# Patient Record
Sex: Female | Born: 1990 | Race: Black or African American | Hispanic: No | Marital: Single | Smoking: Current some day smoker
Health system: Southern US, Community
[De-identification: ages and names within clinical notes are randomized; demographics above are authoritative.]

## PROBLEM LIST (undated history)

## (undated) ENCOUNTER — Ambulatory Visit (HOSPITAL_COMMUNITY): Admission: EM | Payer: No Typology Code available for payment source | Source: Home / Self Care

## (undated) DIAGNOSIS — N760 Acute vaginitis: Secondary | ICD-10-CM

## (undated) DIAGNOSIS — I10 Essential (primary) hypertension: Secondary | ICD-10-CM

## (undated) DIAGNOSIS — M419 Scoliosis, unspecified: Secondary | ICD-10-CM

## (undated) DIAGNOSIS — L732 Hidradenitis suppurativa: Secondary | ICD-10-CM

## (undated) DIAGNOSIS — G44009 Cluster headache syndrome, unspecified, not intractable: Secondary | ICD-10-CM

## (undated) DIAGNOSIS — L0291 Cutaneous abscess, unspecified: Secondary | ICD-10-CM

## (undated) DIAGNOSIS — B9689 Other specified bacterial agents as the cause of diseases classified elsewhere: Secondary | ICD-10-CM

## (undated) HISTORY — PX: WISDOM TOOTH EXTRACTION: SHX21

---
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
## 2011-01-02 LAB — URINALYSIS, ROUTINE W REFLEX MICROSCOPIC
Bilirubin Urine: NEGATIVE
Glucose, UA: NEGATIVE mg/dL
Hgb urine dipstick: NEGATIVE
Ketones, ur: NEGATIVE mg/dL
Nitrite: NEGATIVE
Protein, ur: NEGATIVE mg/dL
Specific Gravity, Urine: 1.024 (ref 1.005–1.030)
Urobilinogen, UA: 0.2 mg/dL (ref 0.0–1.0)

## 2011-01-03 LAB — DIFFERENTIAL
Basophils Absolute: 0 10*3/uL (ref 0.0–0.1)
Eosinophils Absolute: 0.1 10*3/uL (ref 0.0–1.2)
Lymphs Abs: 3.2 10*3/uL (ref 1.1–4.8)
Monocytes Relative: 6 % (ref 3–11)
Neutrophils Relative %: 62 % (ref 43–71)

## 2011-01-03 LAB — COMPREHENSIVE METABOLIC PANEL
Albumin: 4.2 g/dL (ref 3.5–5.2)
BUN: 7 mg/dL (ref 6–23)
CO2: 27 mEq/L (ref 19–32)
Chloride: 99 mEq/L (ref 96–112)
Glucose, Bld: 88 mg/dL (ref 70–99)
Potassium: 3.8 mEq/L (ref 3.5–5.1)
Sodium: 136 mEq/L (ref 135–145)
Total Bilirubin: 0.8 mg/dL (ref 0.3–1.2)

## 2011-01-03 LAB — POCT URINALYSIS DIP (DEVICE)
Bilirubin Urine: NEGATIVE
Hgb urine dipstick: NEGATIVE
Ketones, ur: NEGATIVE mg/dL
Nitrite: NEGATIVE
Protein, ur: NEGATIVE mg/dL
Urobilinogen, UA: 0.2 mg/dL (ref 0.0–1.0)

## 2011-01-03 LAB — POCT I-STAT, CHEM 8
Chloride: 101 mEq/L (ref 96–112)
Creatinine, Ser: 0.8 mg/dL (ref 0.4–1.2)
Glucose, Bld: 87 mg/dL (ref 70–99)
Hemoglobin: 13.3 g/dL (ref 12.0–16.0)
Potassium: 4.4 mEq/L (ref 3.5–5.1)

## 2011-01-03 LAB — CBC
Hemoglobin: 12 g/dL (ref 12.0–16.0)
RBC: 4.82 MIL/uL (ref 3.80–5.70)
RDW: 15.6 % — ABNORMAL HIGH (ref 11.4–15.5)
WBC: 10.3 10*3/uL (ref 4.5–13.5)

## 2011-01-03 LAB — POCT PREGNANCY, URINE: Preg Test, Ur: NEGATIVE

## 2011-01-14 LAB — CULTURE, ROUTINE-ABSCESS

## 2011-03-31 ENCOUNTER — Emergency Department (HOSPITAL_COMMUNITY)
Admission: EM | Admit: 2011-03-31 | Discharge: 2011-03-31 | Disposition: A | Discharge status: Home / Self Care | Payer: Medicaid Other | Attending: Emergency Medicine | Admitting: Emergency Medicine

## 2011-03-31 DIAGNOSIS — L02219 Cutaneous abscess of trunk, unspecified: Secondary | ICD-10-CM | POA: Insufficient documentation

## 2011-03-31 DIAGNOSIS — H9190 Unspecified hearing loss, unspecified ear: Secondary | ICD-10-CM | POA: Insufficient documentation

## 2011-03-31 DIAGNOSIS — H9209 Otalgia, unspecified ear: Secondary | ICD-10-CM | POA: Insufficient documentation

## 2011-07-04 LAB — URINE CULTURE: Colony Count: 70000

## 2011-07-04 LAB — URINALYSIS, ROUTINE W REFLEX MICROSCOPIC
Bilirubin Urine: NEGATIVE
Specific Gravity, Urine: 1.013 (ref 1.005–1.030)

## 2011-07-04 LAB — URINE MICROSCOPIC-ADD ON

## 2011-07-06 ENCOUNTER — Emergency Department (HOSPITAL_COMMUNITY)
Admission: EM | Admit: 2011-07-06 | Discharge: 2011-07-06 | Disposition: A | Discharge status: Home / Self Care | Payer: Medicaid Other | Source: Home / Self Care | Attending: Emergency Medicine | Admitting: Emergency Medicine

## 2011-07-06 ENCOUNTER — Emergency Department (HOSPITAL_COMMUNITY)
Admission: EM | Admit: 2011-07-06 | Discharge: 2011-07-07 | Disposition: A | Discharge status: Home / Self Care | Payer: Medicaid Other | Attending: Emergency Medicine | Admitting: Emergency Medicine

## 2011-07-06 DIAGNOSIS — B86 Scabies: Secondary | ICD-10-CM | POA: Insufficient documentation

## 2011-07-06 DIAGNOSIS — R21 Rash and other nonspecific skin eruption: Secondary | ICD-10-CM | POA: Insufficient documentation

## 2011-07-06 DIAGNOSIS — L298 Other pruritus: Secondary | ICD-10-CM | POA: Insufficient documentation

## 2011-07-06 DIAGNOSIS — T148 Other injury of unspecified body region: Secondary | ICD-10-CM | POA: Insufficient documentation

## 2011-07-06 DIAGNOSIS — R0789 Other chest pain: Secondary | ICD-10-CM | POA: Insufficient documentation

## 2011-07-06 DIAGNOSIS — W57XXXA Bitten or stung by nonvenomous insect and other nonvenomous arthropods, initial encounter: Secondary | ICD-10-CM | POA: Insufficient documentation

## 2011-07-06 DIAGNOSIS — L2989 Other pruritus: Secondary | ICD-10-CM | POA: Insufficient documentation

## 2011-07-09 LAB — CULTURE, ROUTINE-ABSCESS

## 2011-08-11 ENCOUNTER — Emergency Department (HOSPITAL_COMMUNITY)
Admission: EM | Admit: 2011-08-11 | Discharge: 2011-08-11 | Disposition: A | Discharge status: Home / Self Care | Payer: Medicaid Other | Attending: Emergency Medicine | Admitting: Emergency Medicine

## 2011-08-11 DIAGNOSIS — J3489 Other specified disorders of nose and nasal sinuses: Secondary | ICD-10-CM | POA: Insufficient documentation

## 2011-08-11 DIAGNOSIS — R059 Cough, unspecified: Secondary | ICD-10-CM | POA: Insufficient documentation

## 2011-08-11 DIAGNOSIS — R6883 Chills (without fever): Secondary | ICD-10-CM | POA: Insufficient documentation

## 2011-08-11 DIAGNOSIS — R05 Cough: Secondary | ICD-10-CM | POA: Insufficient documentation

## 2011-08-11 DIAGNOSIS — IMO0001 Reserved for inherently not codable concepts without codable children: Secondary | ICD-10-CM | POA: Insufficient documentation

## 2011-08-11 DIAGNOSIS — H9209 Otalgia, unspecified ear: Secondary | ICD-10-CM | POA: Insufficient documentation

## 2011-08-11 DIAGNOSIS — R51 Headache: Secondary | ICD-10-CM | POA: Insufficient documentation

## 2011-08-11 DIAGNOSIS — R070 Pain in throat: Secondary | ICD-10-CM | POA: Insufficient documentation

## 2011-08-11 DIAGNOSIS — R61 Generalized hyperhidrosis: Secondary | ICD-10-CM | POA: Insufficient documentation

## 2011-08-11 DIAGNOSIS — J329 Chronic sinusitis, unspecified: Secondary | ICD-10-CM | POA: Insufficient documentation

## 2011-08-11 HISTORY — DX: Scoliosis, unspecified: M41.9

## 2011-08-11 MED ORDER — PSEUDOEPHEDRINE HCL 30 MG PO TABS: 30.0000 mg | ORAL_TABLET | ORAL | Status: AC | PRN

## 2011-08-11 MED ORDER — MOXIFLOXACIN HCL 400 MG PO TABS: 400.0000 mg | ORAL_TABLET | Freq: Every day | ORAL | Status: AC

## 2011-08-11 NOTE — ED Provider Notes (Signed)
History     CSN: [id number] Arrival date & time: 08/11/2011 10:25 AM   First MD Initiated Contact with Patient 08/11/11 1048      Chief Complaint  Patient presents with  . Cough  . Nasal Congestion  . Generalized Body Aches    (Consider location/radiation/quality/duration/timing/severity/associated sxs/prior treatment) HPI Comments: Patient herewith several day history of cough, sorethroat, headache, facial pain, and body aches.  Has been taking OTC medications without relief.  Patient is a 20 y.o. female presenting with cough. The history is provided by the patient. No language interpreter was used.  Cough This is a new problem. The current episode started 2 days ago. The problem occurs constantly. The problem has not changed since onset.The cough is non-productive. The maximum temperature recorded prior to her arrival was 100 to 100.9 F. Associated symptoms include chills, sweats, ear congestion, ear pain, headaches, rhinorrhea, sore throat and myalgias. Pertinent negatives include no chest pain, no shortness of breath and no wheezing. She has tried nothing for the symptoms. She is not a smoker.    Past Medical History  Diagnosis Date  . Scoliosis     History reviewed. No pertinent past surgical history.  History reviewed. No pertinent family history.  History  Substance Use Topics  . Smoking status: Never Smoker   . Smokeless tobacco: Not on file  . Alcohol Use: No    OB History    Grav Para Term Preterm Abortions TAB SAB Ect Mult Living                  Review of Systems  Constitutional: Positive for chills.  HENT: Positive for ear pain, sore throat and rhinorrhea.   Eyes: Negative.   Respiratory: Positive for cough. Negative for shortness of breath and wheezing.   Cardiovascular: Negative for chest pain.  Gastrointestinal: Negative.   Genitourinary: Negative.   Musculoskeletal: Positive for myalgias.  Neurological: Positive for headaches.  Hematological:  Negative.   Psychiatric/Behavioral: Negative.     Allergies  Penicillins  Home Medications   Current Outpatient Rx  Name Route Sig Dispense Refill  . CETIRIZINE HCL 10 MG PO TABS Oral Take 10 mg by mouth daily.        BP 140/86  Pulse 109  Temp(Src) 98.4 F (36.9 C) (Oral)  Resp 14  SpO2 98%  LMP 08/03/2011  Physical Exam  Nursing note and vitals reviewed. Constitutional: She is oriented to person, place, and time. She appears well-developed and well-nourished.  HENT:  Head: Normocephalic and atraumatic.  Right Ear: External ear normal.  Left Ear: External ear normal.  Nose: Rhinorrhea present. Right sinus exhibits maxillary sinus tenderness and frontal sinus tenderness. Left sinus exhibits maxillary sinus tenderness and frontal sinus tenderness.  Mouth/Throat: Uvula is midline. Posterior oropharyngeal erythema present. No oropharyngeal exudate.  Neck: Normal range of motion. Neck supple.  Cardiovascular: Normal rate, regular rhythm and normal heart sounds.   Pulmonary/Chest: Effort normal and breath sounds normal. She has no wheezes. She has no rales.  Musculoskeletal: Normal range of motion.  Lymphadenopathy:    She has no cervical adenopathy.  Neurological: She is alert and oriented to person, place, and time.  Skin: Skin is warm and dry.  Psychiatric: She has a normal mood and affect. Her behavior is normal. Judgment and thought content normal.    ED Course  Procedures (including critical care time)  Labs Reviewed - No data to display No results found.   Sinusitis   MDM  This is  likely just sinusitis - will send strep and further evaluate.         Izola Price [place], [place] 08/11/11 1239

## 2011-08-11 NOTE — ED Notes (Signed)
Pt states that she has had cough and nasal congestion x 1 week.  PT states that today she woke up with a sore throat with same.  Pt states that she has had intermittent nose bleeds  As well.  Pt states that she has had fever, temp unknown.  Pt states that she has been taking OTC meds for same.  Pt is hoarse at this time.  Redness is noted to the back of her throat.  No coughing at this time.

## 2011-08-11 NOTE — ED Notes (Signed)
Patient presents with cough, sore throat, body aches and headache x 10 days.  Patient has been taking OTC medications and reports they are not working.

## 2011-08-11 NOTE — ED Notes (Signed)
Patient is resting comfortably. Pt offered apple juice at this time.

## 2011-08-11 NOTE — ED Provider Notes (Signed)
Medical screening examination/treatment/procedure(s) were performed by non-physician practitioner and as supervising physician I was immediately available for consultation/collaboration.   Li Bobo A. Patrica Duel, MD 08/11/11 1555

## 2011-08-12 LAB — STREP A DNA PROBE: Group A Strep Probe: NEGATIVE

## 2011-09-15 ENCOUNTER — Emergency Department (HOSPITAL_COMMUNITY): Payer: Medicaid Other

## 2011-09-15 ENCOUNTER — Emergency Department (HOSPITAL_COMMUNITY)
Admission: EM | Admit: 2011-09-15 | Discharge: 2011-09-15 | Disposition: A | Discharge status: Home / Self Care | Payer: Medicaid Other | Attending: Emergency Medicine | Admitting: Emergency Medicine

## 2011-09-15 ENCOUNTER — Encounter (HOSPITAL_COMMUNITY): Payer: Self-pay | Admitting: *Deleted

## 2011-09-15 DIAGNOSIS — M25569 Pain in unspecified knee: Secondary | ICD-10-CM | POA: Insufficient documentation

## 2011-09-15 DIAGNOSIS — Y9229 Other specified public building as the place of occurrence of the external cause: Secondary | ICD-10-CM | POA: Insufficient documentation

## 2011-09-15 DIAGNOSIS — IMO0002 Reserved for concepts with insufficient information to code with codable children: Secondary | ICD-10-CM | POA: Insufficient documentation

## 2011-09-15 DIAGNOSIS — S8390XA Sprain of unspecified site of unspecified knee, initial encounter: Secondary | ICD-10-CM

## 2011-09-15 DIAGNOSIS — X500XXA Overexertion from strenuous movement or load, initial encounter: Secondary | ICD-10-CM | POA: Insufficient documentation

## 2011-09-15 MED ORDER — HYDROCODONE-ACETAMINOPHEN 5-325 MG PO TABS
1.0000 | ORAL_TABLET | Freq: Once | ORAL | Status: AC
  Administered 2011-09-15: 1 via ORAL
  Filled 2011-09-15: qty 1

## 2011-09-15 MED ORDER — HYDROCODONE-ACETAMINOPHEN 5-325 MG PO TABS: 2.0000 | ORAL_TABLET | ORAL | Status: AC | PRN

## 2011-09-15 NOTE — ED Provider Notes (Signed)
History     CSN: [id number] Arrival date & time: 09/15/2011  3:32 AM   First MD Initiated Contact with Patient 09/15/11 0407      Chief Complaint  Patient presents with  . Knee Injury    x 30 mins ago     HPI  History provided by the patient and her friend. Patient is a 20 year old female who presents with complaints of right knee injury and pain while dancing at a club earlier today around 2:45 AM. Patient reports swelling since the time with continued pain. Patient has been ambulatory but with difficulty. Patient has not taken any medications or done any treatment to help her symptoms. Pain is made worse with movements and walking. Patient denies any numbness or tingling in foot. Patient denies any significant prior injuries to same knee. Patient denies any significant past medical history.   Past Medical History  Diagnosis Date  . Scoliosis     History reviewed. No pertinent past surgical history.  History reviewed. No pertinent family history.  History  Substance Use Topics  . Smoking status: Never Smoker   . Smokeless tobacco: Not on file  . Alcohol Use: No    OB History    Grav Para Term Preterm Abortions TAB SAB Ect Mult Living                  Review of Systems  All other systems reviewed and are negative.    Allergies  Penicillins  Home Medications   Current Outpatient Rx  Name Route Sig Dispense Refill  . CETIRIZINE HCL 10 MG PO TABS Oral Take 10 mg by mouth daily.      . IBUPROFEN 200 MG PO TABS Oral Take 400 mg by mouth every 6 (six) hours as needed. cramps       BP 128/86  Pulse 99  Temp(Src) 98.1 F (36.7 C) (Oral)  Resp 20  SpO2 100%  LMP 09/10/2011  Physical Exam  Nursing note and vitals reviewed. Constitutional: She is oriented to person, place, and time. She appears well-developed and well-nourished. No distress.  HENT:  Head: Normocephalic and atraumatic.  Cardiovascular: Normal rate, regular rhythm and normal heart sounds.     Pulmonary/Chest: Effort normal and breath sounds normal.  Musculoskeletal:       Mild edema with tenderness to right knee. Reduced range of motion secondary to pain. Negative anterior posterior drawer test. No increased laxity with valgus rare stress. Normal distal sensations, pedal pulses and movements in foot. No masses in thigh or mass and calf. Normal Thompson test.  Neurological: She is alert and oriented to person, place, and time.  Skin: Skin is warm. No rash noted. No erythema.  Psychiatric: She has a normal mood and affect. Her behavior is normal.    ED Course  Procedures (including critical care time)  Labs Reviewed - No data to display Dg Knee Complete 4 Views Right  09/15/2011  *RADIOLOGY REPORT*  Clinical Data: Right knee pain status post twisting injury.  RIGHT KNEE - COMPLETE 4+ VIEW  Comparison: None.  Findings: No displaced acute fracture or dislocation identified. No aggressive appearing osseous lesion.  IMPRESSION: No acute osseous abnormality.  Original Report Authenticated By: Waneta Martins, M.D.     1. Knee sprain     MDM  4:00 AM patient seen and evaluated. Patient in no acute distress.        Angus Seller, [place] 09/15/11 [phone]

## 2011-09-15 NOTE — ED Notes (Signed)
Discharge instruction given - crutches given, and knee sleeve, ice pack

## 2011-09-15 NOTE — ED Provider Notes (Signed)
Medical screening examination/treatment/procedure(s) were performed by non-physician practitioner and as supervising physician I was immediately available for consultation/collaboration.  Olivia Mackie, MD 09/15/11 [phone]

## 2011-09-15 NOTE — ED Notes (Signed)
Patient has rec'd discharge instructions. Crutches ice pack and knee sleeves. Also given work note for 09/15/11

## 2011-09-15 NOTE — ED Notes (Signed)
Pt was dancing jovially at a club tonight when she thought it to be best to lift another person from there feet and bare their weight upon herself.  Pt then felt right knee pain which has since not resolved.

## 2011-09-21 DIAGNOSIS — M25469 Effusion, unspecified knee: Secondary | ICD-10-CM | POA: Insufficient documentation

## 2011-09-21 DIAGNOSIS — M25569 Pain in unspecified knee: Secondary | ICD-10-CM | POA: Insufficient documentation

## 2011-09-22 ENCOUNTER — Emergency Department (HOSPITAL_COMMUNITY)
Admission: EM | Admit: 2011-09-22 | Discharge: 2011-09-22 | Disposition: A | Discharge status: Home / Self Care | Payer: Medicaid Other | Attending: Emergency Medicine | Admitting: Emergency Medicine

## 2011-09-22 ENCOUNTER — Emergency Department (HOSPITAL_COMMUNITY): Payer: Medicaid Other

## 2011-09-22 ENCOUNTER — Encounter (HOSPITAL_COMMUNITY): Payer: Self-pay | Admitting: *Deleted

## 2011-09-22 DIAGNOSIS — M25569 Pain in unspecified knee: Secondary | ICD-10-CM

## 2011-09-22 MED ORDER — OXYCODONE-ACETAMINOPHEN 5-325 MG PO TABS
1.0000 | ORAL_TABLET | Freq: Once | ORAL | Status: AC
  Administered 2011-09-22: 1 via ORAL
  Filled 2011-09-22: qty 1

## 2011-09-22 NOTE — ED Provider Notes (Signed)
History     CSN: [id number]  Arrival date & time 09/21/11  2349   First MD Initiated Contact with Patient 09/22/11 0244      Chief Complaint  Patient presents with  . Knee Pain    (Consider location/radiation/quality/duration/timing/severity/associated sxs/prior treatment) HPI Patient presents with right knee pain. She had previously injured her right knee by twisting it and falling approximately one week ago. She had an x-ray at that time did not show any bony injuries. She has been wearing a knee sleeve and using crutches as needed but has been able to bear weight somewhat. Today she states that she twisted her knee and it "gave out on her". She did not fall or strike her knee. She does complain of increased pain in the knee tonight. She is having more pain with bearing weight and she had prior to today. She states that she is working on getting followup with orthopedics at this time and needs a referral from her primary Dr. Pain is worse with movement palpation and weightbearing. She has been taking hydrocodone at home as well as ibuprofen which does alleviate the pain somewhat. There no other aggravating or modifying factors. There no associated systemic symptoms.  Past Medical History  Diagnosis Date  . Scoliosis     History reviewed. No pertinent past surgical history.  History reviewed. No pertinent family history.  History  Substance Use Topics  . Smoking status: Never Smoker   . Smokeless tobacco: Not on file  . Alcohol Use: No    OB History    Grav Para Term Preterm Abortions TAB SAB Ect Mult Living                  Review of Systems ROS reviewed and otherwise negative except for mentioned in HPI  Allergies  Penicillins  Home Medications   Current Outpatient Rx  Name Route Sig Dispense Refill  . CETIRIZINE HCL 10 MG PO TABS Oral Take 10 mg by mouth daily.      Marland Kitchen HYDROCODONE-ACETAMINOPHEN 5-325 MG PO TABS Oral Take 2 tablets by mouth every 4 (four) hours  as needed for pain. 10 tablet 0  . IBUPROFEN 200 MG PO TABS Oral Take 400 mg by mouth every 6 (six) hours as needed. cramps       BP 126/82  Pulse 82  Temp(Src) 98.6 F (37 C) (Oral)  Resp 19  SpO2 100%  LMP 09/10/2011 Vitals reviewed Physical Exam Physical Examination: General appearance - alert, well appearing, and in no distress Mental status - alert, oriented to person, place, and time Eyes - pupils equal and reactive, no scleral icterus or conjunctival injectioin Neck - supple, no significant adenopathy Chest - clear to auscultation, no wheezes, rales or rhonchi, symmetric air entry Heart - normal rate, regular rhythm, normal S1, S2, no murmurs, rubs, clicks or gallops Neurological - alert, oriented, normal speech, no focal findings or movement disorder noted Musculoskeletal - diffuse ttp of right knee, no discrete medial or lateral joint line tenderness, no deformity or swelling, some pain with ROM, negative anterior drawer sign, FROM Extremities - peripheral pulses normal, no pedal edema, no clubbing or cyanosis Skin - normal coloration and turgor, no rashes  ED Course  Procedures (including critical care time)  Labs Reviewed - No data to display No results found.   1. Knee pain       MDM  Patient with pain in the right knee. X-rays reveal a small joint effusion but no bony  abnormality. Patient will be supplied with a knee immobilizer and is to continue using crutches and followup with orthopedics. She is agreeable with this plan and was given strict return precautions.        Ethelda Chick, MD 09/24/11 [phone]

## 2011-09-22 NOTE — ED Notes (Signed)
Pt was at work tonight when she fell due to her already injured knee "giving out on her".  Pt has a knee brace which she was wearing at the time but was not using the crutches she was given the last time she was at [facility] on 09/15/11.

## 2011-09-22 NOTE — ED Notes (Signed)
Janice Duarte with ortho came in to put on a brace, pt already has brace in place,

## 2011-12-14 ENCOUNTER — Emergency Department (HOSPITAL_COMMUNITY)
Admission: EM | Admit: 2011-12-14 | Discharge: 2011-12-14 | Disposition: A | Discharge status: Home / Self Care | Payer: Medicaid Other | Attending: Emergency Medicine | Admitting: Emergency Medicine

## 2011-12-14 ENCOUNTER — Encounter (HOSPITAL_COMMUNITY): Payer: Self-pay

## 2011-12-14 DIAGNOSIS — Z880 Allergy status to penicillin: Secondary | ICD-10-CM | POA: Insufficient documentation

## 2011-12-14 DIAGNOSIS — J020 Streptococcal pharyngitis: Secondary | ICD-10-CM

## 2011-12-14 MED ORDER — IBUPROFEN 800 MG PO TABS: 800.0000 mg | ORAL_TABLET | Freq: Three times a day (TID) | ORAL | Status: AC | PRN

## 2011-12-14 MED ORDER — IBUPROFEN 800 MG PO TABS
800.0000 mg | ORAL_TABLET | Freq: Once | ORAL | Status: AC
  Administered 2011-12-14: 800 mg via ORAL
  Filled 2011-12-14: qty 1

## 2011-12-14 MED ORDER — AZITHROMYCIN 250 MG PO TABS: ORAL_TABLET | ORAL | Status: AC

## 2011-12-14 NOTE — ED Notes (Signed)
Patient here with bodyaches and sorethroat x 1 day. No distress and ambulatory

## 2011-12-14 NOTE — Discharge Instructions (Signed)
Please read and follow instructions below.   Your strep test is positive.     You may use 800mg  ibuprofen every 8 hours for the next 3 days for pain.  Take ibuprofen with food.  You may alternate this with tylenol as directed on the packaging as we discussed.    Take entire course of antibiotics as directed. Stopping early can cause antibiotic not to work.   Please return to Emergency Department if your symptoms worsen, you develop a persistent fever that does not improve with medications, you have trouble breathing or swallowing, or have any other concerns.    If you do not have any improvement in 3 days, please return to the Emergency Department or Urgent care (number listed below).

## 2011-12-14 NOTE — ED Provider Notes (Signed)
History     CSN: [id number]  Arrival date & time 12/14/11  1252   First MD Initiated Contact with Patient 12/14/11 1339      Chief Complaint  Patient presents with  . Sore Throat    (Consider location/radiation/quality/duration/timing/severity/associated sxs/prior treatment) HPI Comments: Patient presented today history of sore throat, worsening this morning. No treatments prior to arrival. Patient denies trouble swallowing or shortness of breath.  Patient is a 21 y.o. female presenting with pharyngitis. The history is provided by the patient.  Sore Throat This is a new problem. The current episode started yesterday. The problem occurs constantly. The problem has been gradually worsening. Associated symptoms include chills, coughing, fatigue, myalgias and a sore throat. Pertinent negatives include no abdominal pain, chest pain, congestion, fever, headaches, nausea, neck pain, rash or vomiting. The symptoms are aggravated by swallowing. She has tried nothing for the symptoms.    Past Medical History  Diagnosis Date  . Scoliosis     History reviewed. No pertinent past surgical history.  No family history on file.  History  Substance Use Topics  . Smoking status: Never Smoker   . Smokeless tobacco: Not on file  . Alcohol Use: No    OB History    Grav Para Term Preterm Abortions TAB SAB Ect Mult Living                  Review of Systems  Constitutional: Positive for chills and fatigue. Negative for fever.  HENT: Positive for sore throat. Negative for congestion, rhinorrhea and neck pain.   Eyes: Negative for redness.  Respiratory: Positive for cough. Negative for shortness of breath.   Cardiovascular: Negative for chest pain.  Gastrointestinal: Negative for nausea, vomiting, abdominal pain and diarrhea.  Genitourinary: Negative for dysuria.  Musculoskeletal: Positive for myalgias.  Skin: Negative for rash.  Neurological: Negative for headaches.    Allergies    Penicillins  Home Medications   Current Outpatient Rx  Name Route Sig Dispense Refill  . CETIRIZINE HCL 10 MG PO TABS Oral Take 10 mg by mouth daily.      . IBUPROFEN 200 MG PO TABS Oral Take 400 mg by mouth every 6 (six) hours as needed. cramps       BP 138/89  Pulse 88  Temp(Src) 99.4 F (37.4 C) (Oral)  Resp 16  Ht 5' 5.5" (1.664 m)  SpO2 100%  Physical Exam  Nursing note and vitals reviewed. Constitutional: She is oriented to person, place, and time. She appears well-developed and well-nourished.  HENT:  Head: Normocephalic and atraumatic.  Right Ear: Tympanic membrane, external ear and ear canal normal.  Left Ear: Tympanic membrane, external ear and ear canal normal.  Nose: No mucosal edema or rhinorrhea.  Mouth/Throat: Uvula is midline and mucous membranes are normal. Posterior oropharyngeal erythema present. No oropharyngeal exudate, posterior oropharyngeal edema or tonsillar abscesses.  Eyes: Conjunctivae are normal. Right eye exhibits no discharge. Left eye exhibits no discharge.  Neck: Normal range of motion. Neck supple.  Cardiovascular: Normal rate, regular rhythm and normal heart sounds.   Pulmonary/Chest: Effort normal and breath sounds normal.  Abdominal: Soft. There is no tenderness.  Neurological: She is alert and oriented to person, place, and time.  Skin: Skin is warm and dry.  Psychiatric: She has a normal mood and affect.    ED Course  Procedures (including critical care time)  Labs Reviewed  RAPID STREP SCREEN - Abnormal; Notable for the following:    Streptococcus, Group  A Screen (Direct) POSITIVE (*)    All other components within normal limits   No results found.   1. Streptococcal pharyngitis     2:49 PM Patient seen and examined. Work-up initiated. Will treat for strep throat.   Vital signs reviewed and are as follows: Filed Vitals:   12/14/11 1303  BP: 138/89  Pulse: 88  Temp: 99.4 F (37.4 C)  Resp: 16   Patient urged to  return with worsening symptoms, persistent fever, trouble swallowing or breathing, severe unilateral sore throat. The patient verbalizes understanding and agrees with the plan.   MDM  Patient with sore throat, strep test is positive. Will treat. No airway compromise. No abscess.        Renne Crigler, [place] 12/14/11 [phone]

## 2011-12-14 NOTE — ED Notes (Signed)
Two day history sore throat. Tonsils enlarged.

## 2011-12-14 NOTE — ED Provider Notes (Signed)
Medical screening examination/treatment/procedure(s) were performed by non-physician practitioner and as supervising physician I was immediately available for consultation/collaboration.  Egon Dittus, MD 12/14/11 1549 

## 2011-12-26 ENCOUNTER — Emergency Department (HOSPITAL_COMMUNITY): Payer: Medicaid Other

## 2011-12-26 ENCOUNTER — Encounter (HOSPITAL_COMMUNITY): Payer: Self-pay | Admitting: *Deleted

## 2011-12-26 ENCOUNTER — Emergency Department (HOSPITAL_COMMUNITY)
Admission: EM | Admit: 2011-12-26 | Discharge: 2011-12-26 | Disposition: A | Discharge status: Home / Self Care | Payer: Medicaid Other | Attending: Emergency Medicine | Admitting: Emergency Medicine

## 2011-12-26 DIAGNOSIS — R071 Chest pain on breathing: Secondary | ICD-10-CM | POA: Insufficient documentation

## 2011-12-26 DIAGNOSIS — R0789 Other chest pain: Secondary | ICD-10-CM

## 2011-12-26 DIAGNOSIS — A499 Bacterial infection, unspecified: Secondary | ICD-10-CM | POA: Insufficient documentation

## 2011-12-26 DIAGNOSIS — N760 Acute vaginitis: Secondary | ICD-10-CM | POA: Insufficient documentation

## 2011-12-26 DIAGNOSIS — B9689 Other specified bacterial agents as the cause of diseases classified elsewhere: Secondary | ICD-10-CM | POA: Insufficient documentation

## 2011-12-26 LAB — WET PREP, GENITAL: Yeast Wet Prep HPF POC: NONE SEEN

## 2011-12-26 LAB — URINALYSIS, ROUTINE W REFLEX MICROSCOPIC
Glucose, UA: NEGATIVE mg/dL
Protein, ur: NEGATIVE mg/dL
Specific Gravity, Urine: 1.025 (ref 1.005–1.030)
Urobilinogen, UA: 0.2 mg/dL (ref 0.0–1.0)

## 2011-12-26 LAB — URINE MICROSCOPIC-ADD ON

## 2011-12-26 MED ORDER — OXYCODONE-ACETAMINOPHEN 5-325 MG PO TABS
2.0000 | ORAL_TABLET | Freq: Once | ORAL | Status: AC
  Administered 2011-12-26: 2 via ORAL
  Filled 2011-12-26: qty 2

## 2011-12-26 MED ORDER — METRONIDAZOLE 500 MG PO TABS: 500.0000 mg | ORAL_TABLET | Freq: Two times a day (BID) | ORAL | Status: DC

## 2011-12-26 MED ORDER — TRAMADOL HCL 50 MG PO TABS: 50.0000 mg | ORAL_TABLET | Freq: Four times a day (QID) | ORAL | Status: AC | PRN

## 2011-12-26 MED ORDER — METRONIDAZOLE 500 MG PO TABS
500.0000 mg | ORAL_TABLET | Freq: Once | ORAL | Status: AC
  Administered 2011-12-26: 500 mg via ORAL
  Filled 2011-12-26: qty 1

## 2011-12-26 NOTE — ED Provider Notes (Signed)
Medical screening examination/treatment/procedure(s) were performed by non-physician practitioner and as supervising physician I was immediately available for consultation/collaboration.  Hurman Horn, MD 12/26/11 2252

## 2011-12-26 NOTE — ED Provider Notes (Signed)
History     CSN: [id number]  Arrival date & time 12/26/11  4098   First MD Initiated Contact with Patient 12/26/11 0827      Chief Complaint  Patient presents with  . Pelvic Pain  . Vaginal Discharge    (Consider location/radiation/quality/duration/timing/severity/associated sxs/prior treatment) HPI  Pt presents to the ER with complaints of pelvic pain and vaginal discharge. She states that she has been having the pain and discharge for 3 days. She denies having sexual intercourse within the past year. Denies possibility of being pregnant. Denies N/V/D, fevers, chills weakness or abdominal pain.  Past Medical History  Diagnosis Date  . Scoliosis     Past Surgical History  Procedure Date  . Wisdom tooth extraction     No family history on file.  History  Substance Use Topics  . Smoking status: Never Smoker   . Smokeless tobacco: Not on file  . Alcohol Use: No    OB History    Grav Para Term Preterm Abortions TAB SAB Ect Mult Living                  Review of Systems  All other systems reviewed and are negative.    Allergies  Penicillins  Home Medications   Current Outpatient Rx  Name Route Sig Dispense Refill  . METRONIDAZOLE 500 MG PO TABS Oral Take 1 tablet (500 mg total) by mouth 2 (two) times daily. 13 tablet 0    Take only one dose today. Then twice a day for the ...  . TRAMADOL HCL 50 MG PO TABS Oral Take 1 tablet (50 mg total) by mouth every 6 (six) hours as needed for pain. 15 tablet 0    BP 131/91  Temp(Src) 98 F (36.7 C) (Oral)  Resp 16  Ht 5\' 5"  (1.651 m)  Wt 170 lb (77.111 kg)  BMI 28.29 kg/m2  SpO2 98%  LMP 12/18/2011  Physical Exam  Nursing note and vitals reviewed. Constitutional: She appears well-developed and well-nourished. No distress.  HENT:  Head: Normocephalic and atraumatic.  Eyes: Pupils are equal, round, and reactive to light.  Neck: Normal range of motion. Neck supple.  Cardiovascular: Normal rate and regular  rhythm.   Pulmonary/Chest: Effort normal.  Abdominal: Soft.  Genitourinary: Pelvic exam was performed with patient supine. There is no rash or tenderness on the right labia. There is no rash or tenderness on the left labia. Cervix exhibits discharge (white/watery). Cervix exhibits no motion tenderness and no friability. Right adnexum displays no tenderness. Left adnexum displays no tenderness. There is tenderness around the vagina. No bleeding around the vagina. No foreign body around the vagina. Vaginal discharge found.  Neurological: She is alert.  Skin: Skin is warm and dry.    ED Course  Procedures (including critical care time)  Labs Reviewed  URINALYSIS, ROUTINE W REFLEX MICROSCOPIC - Abnormal; Notable for the following:    APPearance CLOUDY (*)    Hgb urine dipstick TRACE (*)    Leukocytes, UA LARGE (*)    All other components within normal limits  WET PREP, GENITAL - Abnormal; Notable for the following:    Clue Cells Wet Prep HPF POC FEW (*)    WBC, Wet Prep HPF POC FEW (*)    All other components within normal limits  URINE MICROSCOPIC-ADD ON - Abnormal; Notable for the following:    Squamous Epithelial / LPF MANY (*)    All other components within normal limits  PREGNANCY, URINE  GC/CHLAMYDIA  PROBE AMP, GENITAL   No results found.   1. Costochondral chest pain   2. Bacterial vaginosis       MDM  pts wet prep shows clue cells. Will treat with Metronidazole. Pt received first dose in ED. During stay patient began to complain of right upper chest hurting. A chest xray was added on to ensure no pulmonary pathology involved. The pain is worsened to palpation, I suspect costochondritis pt is PERC negative.        Dorthula Matas, PA 12/26/11 1008

## 2011-12-26 NOTE — Discharge Instructions (Signed)
Bacterial Vaginosis Bacterial vaginosis (BV) is a vaginal infection where the normal balance of bacteria in the vagina is disrupted. The normal balance is then replaced by an overgrowth of certain bacteria. There are several different kinds of bacteria that can cause BV. BV is the most common vaginal infection in women of childbearing age. CAUSES   The cause of BV is not fully understood. BV develops when there is an increase or imbalance of harmful bacteria.   Some activities or behaviors can upset the normal balance of bacteria in the vagina and put women at increased risk including:   Having a new sex partner or multiple sex partners.   Douching.   Using an intrauterine device (IUD) for contraception.   It is not clear what role sexual activity plays in the development of BV. However, women that have never had sexual intercourse are rarely infected with BV.  Women do not get BV from toilet seats, bedding, swimming pools or from touching objects around them.  SYMPTOMS   Grey vaginal discharge.   A fish-like odor with discharge, especially after sexual intercourse.   Itching or burning of the vagina and vulva.   Burning or pain with urination.   Some women have no signs or symptoms at all.  DIAGNOSIS  Your caregiver must examine the vagina for signs of BV. Your caregiver will perform lab tests and look at the sample of vaginal fluid through a microscope. They will look for bacteria and abnormal cells (clue cells), a pH test higher than 4.5, and a positive amine test all associated with BV.  RISKS AND COMPLICATIONS   Pelvic inflammatory disease (PID).   Infections following gynecology surgery.   Developing HIV.   Developing herpes virus.  TREATMENT  Sometimes BV will clear up without treatment. However, all women with symptoms of BV should be treated to avoid complications, especially if gynecology surgery is planned. Female partners generally do not need to be treated. However,  BV may spread between female sex partners so treatment is helpful in preventing a recurrence of BV.   BV may be treated with antibiotics. The antibiotics come in either pill or vaginal cream forms. Either can be used with nonpregnant or pregnant women, but the recommended dosages differ. These antibiotics are not harmful to the baby.   BV can recur after treatment. If this happens, a second round of antibiotics will often be prescribed.   Treatment is important for pregnant women. If not treated, BV can cause a premature delivery, especially for a pregnant woman who had a premature birth in the past. All pregnant women who have symptoms of BV should be checked and treated.   For chronic reoccurrence of BV, treatment with a type of prescribed gel vaginally twice a week is helpful.  HOME CARE INSTRUCTIONS   Finish all medication as directed by your caregiver.   Do not have sex until treatment is completed.   Tell your sexual partner that you have a vaginal infection. They should see their caregiver and be treated if they have problems, such as a mild rash or itching.   Practice safe sex. Use condoms. Only have 1 sex partner.  PREVENTION  Basic prevention steps can help reduce the risk of upsetting the natural balance of bacteria in the vagina and developing BV:  Do not have sexual intercourse (be abstinent).   Do not douche.   Use all of the medicine prescribed for treatment of BV, even if the signs and symptoms go away.     Tell your sex partner if you have BV. That way, they can be treated, if needed, to prevent reoccurrence.  SEEK MEDICAL CARE IF:   Your symptoms are not improving after 3 days of treatment.   You have increased discharge, pain, or fever.  MAKE SURE YOU:   Understand these instructions.   Will watch your condition.   Will get help right away if you are not doing well or get worse.  FOR MORE INFORMATION  Division of STD Prevention (DSTDP), Centers for Disease  Control and Prevention: SolutionApps.co.za American Social Health Association (ASHA): www.ashastd.org  Document Released: 09/15/2005 Document Revised: 09/04/2011 Document Reviewed: 03/08/2009 ExitCare Patient Information 2012 ExitCare, [facility]  Chest Wall Pain Chest wall pain is pain in or around the bones and muscles of your chest. It may take up to 6 weeks to get better. It may take longer if you must stay physically active in your work and activities.  CAUSES  Chest wall pain may happen on its own. However, it may be caused by:  A viral illness like the flu.   Injury.   Coughing.   Exercise.   Arthritis.   Fibromyalgia.   Shingles.  HOME CARE INSTRUCTIONS   Avoid overtiring physical activity. Try not to strain or perform activities that cause pain. This includes any activities using your chest or your abdominal and side muscles, especially if heavy weights are used.   Put ice on the sore area.   Put ice in a plastic bag.   Place a towel between your skin and the bag.   Leave the ice on for 15 to 20 minutes per hour while awake for the first 2 days.   Only take over-the-counter or prescription medicines for pain, discomfort, or fever as directed by your caregiver.  SEEK IMMEDIATE MEDICAL CARE IF:   Your pain increases, or you are very uncomfortable.   You have a fever.   Your chest pain becomes worse.   You have new, unexplained symptoms.   You have nausea or vomiting.   You feel sweaty or lightheaded.   You have a cough with phlegm (sputum), or you cough up blood.  MAKE SURE YOU:   Understand these instructions.   Will watch your condition.   Will get help right away if you are not doing well or get worse.  Document Released: 09/15/2005 Document Revised: 09/04/2011 Document Reviewed: 05/12/2011 [facility] Patient Information 2012 [place], [place].

## 2011-12-26 NOTE — ED Notes (Addendum)
Pt reports pelvic pain and white vaginal discharge x 1 day. States period ended on the 21st of this month. Reports vaginal itching, no urinary frequency/pain/blood. Pt also reports epigastric pain- no nausea/vomiting.

## 2012-01-04 ENCOUNTER — Emergency Department (HOSPITAL_COMMUNITY)
Admission: EM | Admit: 2012-01-04 | Discharge: 2012-01-05 | Disposition: A | Discharge status: Home / Self Care | Payer: Medicaid Other | Attending: Emergency Medicine | Admitting: Emergency Medicine

## 2012-01-04 ENCOUNTER — Encounter (HOSPITAL_COMMUNITY): Payer: Self-pay

## 2012-01-04 DIAGNOSIS — M4120 Other idiopathic scoliosis, site unspecified: Secondary | ICD-10-CM | POA: Insufficient documentation

## 2012-01-04 DIAGNOSIS — B373 Candidiasis of vulva and vagina: Secondary | ICD-10-CM

## 2012-01-04 DIAGNOSIS — R51 Headache: Secondary | ICD-10-CM | POA: Insufficient documentation

## 2012-01-04 DIAGNOSIS — B3731 Acute candidiasis of vulva and vagina: Secondary | ICD-10-CM

## 2012-01-04 DIAGNOSIS — Z79899 Other long term (current) drug therapy: Secondary | ICD-10-CM | POA: Insufficient documentation

## 2012-01-04 DIAGNOSIS — N898 Other specified noninflammatory disorders of vagina: Secondary | ICD-10-CM | POA: Insufficient documentation

## 2012-01-04 HISTORY — DX: Other specified bacterial agents as the cause of diseases classified elsewhere: B96.89

## 2012-01-04 HISTORY — DX: Cluster headache syndrome, unspecified, not intractable: G44.009

## 2012-01-04 HISTORY — DX: Other specified bacterial agents as the cause of diseases classified elsewhere: N76.0

## 2012-01-04 NOTE — ED Notes (Signed)
Pt seen and treated for vaginal infection at [facility]- compliant meds- now c/o of greenish discharge .  Pt also c/o of headache.  Denies urinary problems- c/o of nausea only

## 2012-01-05 LAB — WET PREP, GENITAL: Trich, Wet Prep: NONE SEEN

## 2012-01-05 LAB — URINALYSIS, ROUTINE W REFLEX MICROSCOPIC
Glucose, UA: NEGATIVE mg/dL
Hgb urine dipstick: NEGATIVE
Protein, ur: NEGATIVE mg/dL
pH: 7.5 (ref 5.0–8.0)

## 2012-01-05 LAB — URINE MICROSCOPIC-ADD ON

## 2012-01-05 MED ORDER — KETOROLAC TROMETHAMINE 60 MG/2ML IM SOLN
60.0000 mg | Freq: Once | INTRAMUSCULAR | Status: AC
  Administered 2012-01-05: 60 mg via INTRAMUSCULAR
  Filled 2012-01-05: qty 2

## 2012-01-05 MED ORDER — FLUCONAZOLE 200 MG PO TABS
200.0000 mg | ORAL_TABLET | Freq: Once | ORAL | Status: AC
  Administered 2012-01-05: 200 mg via ORAL
  Filled 2012-01-05: qty 1

## 2012-01-05 MED ORDER — DIPHENHYDRAMINE HCL 50 MG/ML IJ SOLN
12.5000 mg | Freq: Once | INTRAMUSCULAR | Status: AC
  Administered 2012-01-05: 12.5 mg via INTRAMUSCULAR
  Filled 2012-01-05: qty 1

## 2012-01-05 MED ORDER — METOCLOPRAMIDE HCL 5 MG/ML IJ SOLN
10.0000 mg | Freq: Once | INTRAMUSCULAR | Status: AC
  Administered 2012-01-05: 10 mg via INTRAMUSCULAR
  Filled 2012-01-05: qty 2

## 2012-01-05 NOTE — ED Provider Notes (Signed)
History     CSN: [id number]  Arrival date & time 01/04/12  2214   First MD Initiated Contact with Patient 01/05/12 0117      Chief Complaint  Patient presents with  . Headache  . Vaginal Discharge    (Consider location/radiation/quality/duration/timing/severity/associated sxs/prior treatment) Patient is a 21 y.o. female presenting with vaginal discharge and headaches. The history is provided by the patient.  Vaginal Discharge This is a new problem. The current episode started yesterday. The problem occurs constantly. The problem has been unchanged. Associated symptoms include headaches and nausea. Pertinent negatives include no abdominal pain, anorexia, change in bowel habit, coughing, fever, urinary symptoms or vomiting. The symptoms are aggravated by nothing. She has tried nothing for the symptoms.  Headache  This is a new problem. The current episode started 12 to 24 hours ago. The problem occurs constantly. The problem has not changed since onset.The headache is associated with bright light. The pain is located in the left unilateral region. The quality of the pain is described as dull and throbbing. The pain is moderate. The pain does not radiate. Associated symptoms include nausea. Pertinent negatives include no anorexia, no fever, no malaise/fatigue, no chest pressure, no near-syncope, no palpitations, no syncope, no shortness of breath and no vomiting. She has tried NSAIDs for the symptoms. The treatment provided mild relief.   Pt presents with c/o vaginal dc. Was seen and txed at [facility] about 2 weeks ago for BV. Finished meds as rxed. Noticed a discharge that appeared white-green yesterday which has been copious. No new sexual partners. No vaginal bleeding. No urinary sx. LMP 3/21.  Also presents with HA. No hx migraines. States she was seen for similar at [facility] about a week ago and treated with meds and sent home. States this does feel similar. Has tried NSAID at home with little to no  relief. Denies neck pain/stiffness. No recent URI sx or sinus pressure. Slight nausea but no vomiting. Some photophobia without phonophobia.  Past Medical History  Diagnosis Date  . Scoliosis   . Bacterial vaginal infection   . Headaches, cluster     Past Surgical History  Procedure Date  . Wisdom tooth extraction     No family history on file.  History  Substance Use Topics  . Smoking status: Never Smoker   . Smokeless tobacco: Not on file  . Alcohol Use: No    OB History    Grav Para Term Preterm Abortions TAB SAB Ect Mult Living                  Review of Systems  Constitutional: Negative for fever and malaise/fatigue.  Respiratory: Negative for cough and shortness of breath.   Cardiovascular: Negative for palpitations, syncope and near-syncope.  Gastrointestinal: Positive for nausea. Negative for vomiting, abdominal pain, anorexia and change in bowel habit.  Genitourinary: Positive for vaginal discharge.  Neurological: Positive for headaches.  ROS as per HPI, otherwise negative  Allergies  Penicillins  Home Medications   Current Outpatient Rx  Name Route Sig Dispense Refill  . TRAMADOL HCL 50 MG PO TABS Oral Take 1 tablet (50 mg total) by mouth every 6 (six) hours as needed for pain. 15 tablet 0    BP 125/75  Pulse 90  Temp(Src) 99 F (37.2 C) (Oral)  Resp 18  Ht 5\' 5"  (1.651 m)  Wt 165 lb (74.844 kg)  BMI 27.46 kg/m2  SpO2 100%  LMP 12/18/2011  Physical Exam  Nursing note and  vitals reviewed. Constitutional: She is oriented to person, place, and time. She appears well-developed and well-nourished. No distress.  HENT:  Head: Normocephalic and atraumatic.  Eyes: Conjunctivae and EOM are normal. Pupils are equal, round, and reactive to light.  Neck: Normal range of motion. Neck supple.       Negative meningeal signs  Cardiovascular: Normal rate, regular rhythm and normal heart sounds.   Pulmonary/Chest: Effort normal and breath sounds normal. She  exhibits no tenderness.  Abdominal: Soft. Bowel sounds are normal. There is no tenderness. There is no rebound and no guarding.  Genitourinary: Vaginal discharge found.       NT chaperone present for exam Copious thick white discharge c/w yeast present in vaginal vault. No CMT. Cervix nonfriable.  Lymphadenopathy:    She has no cervical adenopathy.  Neurological: She is alert and oriented to person, place, and time. No cranial nerve deficit. She exhibits normal muscle tone. Coordination normal.  Skin: Skin is warm and dry. No rash noted. She is not diaphoretic.  Psychiatric: She has a normal mood and affect.    ED Course  Procedures (including critical care time)  Labs Reviewed  WET PREP, GENITAL - Abnormal; Notable for the following:    Yeast Wet Prep HPF POC FEW (*)    WBC, Wet Prep HPF POC TOO NUMEROUS TO COUNT (*)    All other components within normal limits  URINALYSIS, ROUTINE W REFLEX MICROSCOPIC - Abnormal; Notable for the following:    APPearance CLOUDY (*)    Leukocytes, UA LARGE (*)    All other components within normal limits  URINE MICROSCOPIC-ADD ON - Abnormal; Notable for the following:    Squamous Epithelial / LPF MANY (*)    Bacteria, UA FEW (*)    All other components within normal limits  GC/CHLAMYDIA PROBE AMP, GENITAL  LAB REPORT - SCANNED   No results found.   1. Vaginal candidiasis       MDM  Pt presents with vaginal dc and HA.  Vaginal dc appears c/w yeast infx after receiving abx to tx BV. Was given Diflucan here. No urinary sx or abd pain.  Also c/o HA with slight nausea and photophobia. Appears comfortable, able to tolerate food in the dept. Vitals stable, normal neuro exam, no evidence of meningismus. Don't feel that imaging necessary at this time. Was txed with HA cocktail and felt significantly better with this. Encouraged to cont NSAIDs prn at home. Return precautions discussed.        Grant Fontana, [place] 01/07/12 2242

## 2012-01-05 NOTE — Discharge Instructions (Signed)
Your swab showed a yeast infection. You have been given medicine to treat this while here, which should help resolve the symptoms. Take Tylenol for your headache. Return to the ER with worsening pain, high fever, trouble moving your neck, or any other worrisome symptoms.  RESOURCE GUIDE  Dental Problems  Patients with Medicaid: [facility] [phone] [address]                                           [phone] [address] Phone:  [phone]                                                  Phone:  [phone]  If unable to pay or uninsured, contact:  [facility] or [facility]. to become qualified for the adult dental clinic.  Chronic Pain Problems Contact [facility]  [phone] Patients need to be referred by their primary care doctor.  Insufficient Money for Medicine Contact United Way:  call "211" or [facility] (936)050-7140.  No Primary Care Doctor Call [facility]  [phone] Other agencies that provide inexpensive medical care    [facility]  [phone]    [facility]  [phone]    [facility]  [phone]    [facility]  [phone]    Planned Parenthood  6405849289    [facility]  [phone]  Psychological Services [facility]  [phone] [facility]  [phone] [facility]   [phone] (emergency services [phone])  Substance Abuse Resources [facility]  445-090-5892 [facility] [phone] [facility] ([phone] Floydene Flock [phone] Residential & Outpatient Substance Abuse Program  978-666-7887  Abuse/Neglect [facility] Child Abuse Hotline 605-016-8863 [facility] Child Abuse Hotline (716) 872-7833 (After Hours)  Emergency Shelter [facility] Minist[facility] (304)764-8330  Maternity Homes [facility] at [facility] [place] [facility] (254)882-1157 [facility] 680-294-5508  MRSA Hotline #:   (754)760-2903    [facility] Resources  [facility]     United [employer]                          [facility] Dept. [address] [place]                       [address]      [address]  [place]                                                Cristobal Goldmann Phone:  [phone]  Phone:  [phone]                 Phone:  [phone]  [facility] Phone:  ([phone]  [facility] Child Abuse Hotline (904)754-3408 737-140-7886 (After Hours)  Candidal Vulvovaginitis Candidal vulvovaginitis is an infection of the vagina and vulva. The vulva is the skin around the opening of the vagina. This may cause itching and discomfort in and around the vagina.  HOME CARE  Only take medicine as told by your doctor.   Do not have sex (intercourse) until the infection is healed or as told by your doctor.   Practice safe sex.   Tell your sex partner about your infection.   Do not douche or use tampons.   Wear cotton underwear. Do not wear tight pants or panty hose.   Eat yogurt. This may help treat and prevent yeast infections.  GET HELP RIGHT AWAY IF:   You have a fever.   Your problems get worse during treatment or do not get better in 3 days.   You have discomfort, irritation, or itching in your vagina or vulva area.   You have pain after sex.   You start to get belly (abdominal) pain.  MAKE SURE YOU:  Understand these instructions.   Will watch your condition.   Will get help right away if you are not doing well or get worse.  Document Released: 12/12/2008 Document Revised: 09/04/2011 Document Reviewed: 12/12/2008 [facility] Patient Information 2012 [place], [place].

## 2012-01-06 LAB — GC/CHLAMYDIA PROBE AMP, GENITAL: GC Probe Amp, Genital: NEGATIVE

## 2012-01-08 NOTE — ED Provider Notes (Signed)
Medical screening examination/treatment/procedure(s) were performed by non-physician practitioner and as supervising physician I was immediately available for consultation/collaboration.   Brysin Towery D Kathi Dohn, MD 01/08/12 0607 

## 2012-03-02 ENCOUNTER — Encounter (HOSPITAL_COMMUNITY): Payer: Self-pay | Admitting: *Deleted

## 2012-03-02 ENCOUNTER — Emergency Department (HOSPITAL_COMMUNITY)
Admission: EM | Admit: 2012-03-02 | Discharge: 2012-03-03 | Disposition: A | Discharge status: Home / Self Care | Payer: Self-pay | Attending: Emergency Medicine | Admitting: Emergency Medicine

## 2012-03-02 DIAGNOSIS — R112 Nausea with vomiting, unspecified: Secondary | ICD-10-CM | POA: Insufficient documentation

## 2012-03-02 DIAGNOSIS — R109 Unspecified abdominal pain: Secondary | ICD-10-CM | POA: Insufficient documentation

## 2012-03-02 MED ORDER — SODIUM CHLORIDE 0.9 % IV SOLN
1000.0000 mL | INTRAVENOUS | Status: DC
  Administered 2012-03-03: 1000 mL via INTRAVENOUS

## 2012-03-02 MED ORDER — SODIUM CHLORIDE 0.9 % IV SOLN
1000.0000 mL | Freq: Once | INTRAVENOUS | Status: AC
  Administered 2012-03-03: 1000 mL via INTRAVENOUS

## 2012-03-02 MED ORDER — ONDANSETRON HCL 4 MG/2ML IJ SOLN
4.0000 mg | Freq: Once | INTRAMUSCULAR | Status: AC
  Administered 2012-03-03: 4 mg via INTRAVENOUS
  Filled 2012-03-02: qty 2

## 2012-03-02 NOTE — ED Notes (Signed)
Pt reports eating [employer] and immediately feeling nauseated, lower abd pain, and vomiting. Reports vomiting x's 3 pta.

## 2012-03-02 NOTE — ED Provider Notes (Signed)
History     CSN: [id number]  Arrival date & time 03/02/12  2157   First MD Initiated Contact with Patient 03/02/12 2315      Chief Complaint  Patient presents with  . Abdominal Pain  . Emesis    (Consider location/radiation/quality/duration/timing/severity/associated sxs/prior treatment) HPI Comments: Patient states she at [facility].  This evening, and shortly afterwards developed, nausea, vomiting, abdominal cramping.  She reports, that she had 3 episodes of vomiting.  She can immediately to the emergency department.  Denies any diarrhea, constipation, UTI symptoms  Patient is a 21 y.o. female presenting with abdominal pain and vomiting. The history is provided by the patient.  Abdominal Pain The primary symptoms of the illness include abdominal pain, nausea and vomiting. The primary symptoms of the illness do not include fever, diarrhea or dysuria. The onset of the illness was sudden. The problem has not changed since onset. The pain came on suddenly. The abdominal pain has been unchanged since its onset. The abdominal pain is generalized. The severity of the abdominal pain is 2/10. The abdominal pain is relieved by nothing. The abdominal pain is exacerbated by vomiting.  The vomiting began today. The emesis contains stomach contents.  Symptoms associated with the illness do not include chills.  Emesis  Associated symptoms include abdominal pain. Pertinent negatives include no chills, no diarrhea and no fever.    Past Medical History  Diagnosis Date  . Scoliosis   . Bacterial vaginal infection   . Headaches, cluster     Past Surgical History  Procedure Date  . Wisdom tooth extraction     History reviewed. No pertinent family history.  History  Substance Use Topics  . Smoking status: Never Smoker   . Smokeless tobacco: Not on file  . Alcohol Use: No    OB History    Grav Para Term Preterm Abortions TAB SAB Ect Mult Living                  Review of Systems    Constitutional: Negative for fever and chills.  HENT: Negative for rhinorrhea.   Gastrointestinal: Positive for nausea, vomiting and abdominal pain. Negative for diarrhea.  Genitourinary: Negative for dysuria.  Skin: Negative for pallor.  Neurological: Negative for dizziness and weakness.    Allergies  Penicillins  Home Medications   Current Outpatient Rx  Name Route Sig Dispense Refill  . PROMETHAZINE HCL 25 MG PO TABS Oral Take 1 tablet (25 mg total) by mouth every 6 (six) hours as needed for nausea. 15 tablet 0    BP 128/74  Pulse 85  Temp(Src) 99.8 F (37.7 C) (Oral)  Resp 24  SpO2 100%  LMP 02/09/2012  Physical Exam  Constitutional: She appears well-developed and well-nourished.  HENT:  Head: Normocephalic.  Eyes: Pupils are equal, round, and reactive to light.  Neck: Normal range of motion.  Cardiovascular: Normal rate.   Pulmonary/Chest: Effort normal.  Abdominal: Soft. She exhibits no distension. There is no tenderness.  Musculoskeletal: Normal range of motion.  Neurological: She is alert.  Skin: Skin is warm.    ED Course  Procedures (including critical care time)  Labs Reviewed  URINALYSIS, ROUTINE W REFLEX MICROSCOPIC - Abnormal; Notable for the following:    Ketones, ur TRACE (*)    All other components within normal limits   No results found.   1. Nausea and vomiting       MDM   Tested for food poisoning.  Will hydrate, and treat nausea  Arman Filter, NP 03/03/12 [phone]

## 2012-03-03 LAB — URINALYSIS, ROUTINE W REFLEX MICROSCOPIC
Bilirubin Urine: NEGATIVE
Leukocytes, UA: NEGATIVE
Nitrite: NEGATIVE
Protein, ur: NEGATIVE mg/dL
Urobilinogen, UA: 0.2 mg/dL (ref 0.0–1.0)

## 2012-03-03 MED ORDER — PROMETHAZINE HCL 25 MG PO TABS: 25.0000 mg | ORAL_TABLET | Freq: Four times a day (QID) | ORAL | Status: DC | PRN

## 2012-03-03 NOTE — ED Notes (Signed)
Pt was given 8oz of water to drink and the pt tolerated the water without any complaints of nausea.

## 2012-03-03 NOTE — ED Notes (Signed)
Dc IV. 

## 2012-03-03 NOTE — ED Provider Notes (Signed)
Medical screening examination/treatment/procedure(s) were performed by non-physician practitioner and as supervising physician I was immediately available for consultation/collaboration.    Vida Roller, MD 03/03/12 [phone]

## 2012-03-04 ENCOUNTER — Ambulatory Visit: Payer: No Typology Code available for payment source | Admitting: Family Medicine

## 2012-03-23 ENCOUNTER — Ambulatory Visit: Payer: No Typology Code available for payment source | Admitting: Family Medicine

## 2013-01-18 ENCOUNTER — Encounter (HOSPITAL_COMMUNITY): Payer: Self-pay | Admitting: Nurse Practitioner

## 2013-01-18 ENCOUNTER — Emergency Department (HOSPITAL_COMMUNITY)
Admission: EM | Admit: 2013-01-18 | Discharge: 2013-01-18 | Disposition: A | Discharge status: Home / Self Care | Payer: Medicaid Other | Attending: Emergency Medicine | Admitting: Emergency Medicine

## 2013-01-18 DIAGNOSIS — R110 Nausea: Secondary | ICD-10-CM | POA: Insufficient documentation

## 2013-01-18 DIAGNOSIS — N926 Irregular menstruation, unspecified: Secondary | ICD-10-CM

## 2013-01-18 DIAGNOSIS — Z3202 Encounter for pregnancy test, result negative: Secondary | ICD-10-CM | POA: Insufficient documentation

## 2013-01-18 DIAGNOSIS — Z8739 Personal history of other diseases of the musculoskeletal system and connective tissue: Secondary | ICD-10-CM | POA: Insufficient documentation

## 2013-01-18 DIAGNOSIS — Z8742 Personal history of other diseases of the female genital tract: Secondary | ICD-10-CM | POA: Insufficient documentation

## 2013-01-18 DIAGNOSIS — Z880 Allergy status to penicillin: Secondary | ICD-10-CM | POA: Insufficient documentation

## 2013-01-18 DIAGNOSIS — Z8669 Personal history of other diseases of the nervous system and sense organs: Secondary | ICD-10-CM | POA: Insufficient documentation

## 2013-01-18 LAB — URINALYSIS, ROUTINE W REFLEX MICROSCOPIC
Bilirubin Urine: NEGATIVE
Hgb urine dipstick: NEGATIVE
Ketones, ur: NEGATIVE mg/dL
Nitrite: NEGATIVE
Protein, ur: NEGATIVE mg/dL
Urobilinogen, UA: 1 mg/dL (ref 0.0–1.0)

## 2013-01-18 NOTE — ED Notes (Signed)
Pt reports her last menstrual cycle was 3/16, is overdue now. Took home pregnancy test today that was negative. Reports on 4/5 she had one episode of bloody discharge. Denies any pelvic related complaints now. Does c/o headache

## 2013-01-18 NOTE — ED Provider Notes (Signed)
History     CSN: [id number]  Arrival date & time 01/18/13  1157   First MD Initiated Contact with Patient 01/18/13 1213      Chief Complaint  Patient presents with  . Amenorrhea    (Consider location/radiation/quality/duration/timing/severity/associated sxs/prior treatment) HPI Comments: Patient presents for missed menstrual period.  LMP was 3/16.  She had unprotected sex with a female on 3/29.  4/5 she had some slight pelvic cramping (2/10 intensity) and bloody discharge.  She has since not had a period.  Has had loose stools once daily for the past few days.  She has had nausea without vomiting.  Her periods are usually very regular.  Denies fevers, urinary symptoms, abnormal vaginal discharge, abdominal or pelvic pain.    The history is provided by the patient.    Past Medical History  Diagnosis Date  . Scoliosis   . Bacterial vaginal infection   . Headaches, cluster     Past Surgical History  Procedure Laterality Date  . Wisdom tooth extraction      History reviewed. No pertinent family history.  History  Substance Use Topics  . Smoking status: Never Smoker   . Smokeless tobacco: Not on file  . Alcohol Use: No    OB History   Grav Para Term Preterm Abortions TAB SAB Ect Mult Living                  Review of Systems  Constitutional: Negative for fever.  Cardiovascular: Negative for chest pain.  Gastrointestinal: Positive for nausea. Negative for vomiting, abdominal pain and diarrhea.  Genitourinary: Negative for dysuria, urgency, frequency, vaginal bleeding and vaginal discharge.    Allergies  Bactrim and Penicillins  Home Medications   Current Outpatient Rx  Name  Route  Sig  Dispense  Refill  . EXPIRED: promethazine (PHENERGAN) 25 MG tablet   Oral   Take 1 tablet (25 mg total) by mouth every 6 (six) hours as needed for nausea.   15 tablet   0     BP 132/86  Pulse 101  Temp(Src) 98.5 F (36.9 C) (Oral)  Resp 20  SpO2 100%  Physical Exam   Nursing note and vitals reviewed. Constitutional: She appears well-developed and well-nourished. No distress.  HENT:  Head: Normocephalic and atraumatic.  Neck: Neck supple.  Cardiovascular: Normal rate and regular rhythm.   Pulmonary/Chest: Effort normal and breath sounds normal. No respiratory distress. She has no wheezes. She has no rales.  Abdominal: Soft. She exhibits no distension. There is no tenderness. There is no rebound and no guarding.  Neurological: She is alert.  Skin: She is not diaphoretic.    ED Course  Procedures (including critical care time)  Labs Reviewed  URINALYSIS, ROUTINE W REFLEX MICROSCOPIC  POCT PREGNANCY, URINE   No results found.   1. Menstrual irregularity       MDM  Pt presents for missed menstrual period.  Urine preg is negative here.  I have discussed with patient and her girlfriend (with patient's permission) that patient had unprotected sex on day 13 of her cycle, and that pregnancy tests may still be negative at this point, and do not necessarily mean she is not pregnant.  Abdominal exam is benign.  No abnormal discharge or concern for infection at this time.  Pregnancy test is negative.  Discussed home care until she knows for sure whether she is pregnant or not.  Recommended home test in one week.  F/U with PCP.  Discussed all  results with patient.  Pt given return precautions.  Pt verbalizes understanding and agrees with plan.           Trixie Dredge, PA-C 01/18/13 1347

## 2013-01-18 NOTE — ED Notes (Signed)
Pt given warm blanket.

## 2013-01-18 NOTE — ED Notes (Signed)
Pt up ambulatory to the bathroom at this time to attempt to provide an urine specimen 

## 2013-01-19 ENCOUNTER — Inpatient Hospital Stay (HOSPITAL_COMMUNITY)
Admission: AD | Admit: 2013-01-19 | Discharge: 2013-01-19 | Disposition: A | Discharge status: Home / Self Care | Payer: Medicaid Other | Source: Ambulatory Visit | Attending: Obstetrics and Gynecology | Admitting: Obstetrics and Gynecology

## 2013-01-19 ENCOUNTER — Encounter (HOSPITAL_COMMUNITY): Payer: Self-pay | Admitting: *Deleted

## 2013-01-19 DIAGNOSIS — R197 Diarrhea, unspecified: Secondary | ICD-10-CM | POA: Insufficient documentation

## 2013-01-19 DIAGNOSIS — N912 Amenorrhea, unspecified: Secondary | ICD-10-CM | POA: Insufficient documentation

## 2013-01-19 DIAGNOSIS — N911 Secondary amenorrhea: Secondary | ICD-10-CM

## 2013-01-19 DIAGNOSIS — B373 Candidiasis of vulva and vagina: Secondary | ICD-10-CM

## 2013-01-19 DIAGNOSIS — B3731 Acute candidiasis of vulva and vagina: Secondary | ICD-10-CM | POA: Insufficient documentation

## 2013-01-19 DIAGNOSIS — N949 Unspecified condition associated with female genital organs and menstrual cycle: Secondary | ICD-10-CM | POA: Insufficient documentation

## 2013-01-19 LAB — WET PREP, GENITAL: Clue Cells Wet Prep HPF POC: NONE SEEN

## 2013-01-19 LAB — HCG, SERUM, QUALITATIVE: Preg, Serum: NEGATIVE

## 2013-01-19 MED ORDER — FLUCONAZOLE 150 MG PO TABS
150.0000 mg | ORAL_TABLET | Freq: Once | ORAL | Status: AC
  Administered 2013-01-19: 150 mg via ORAL
  Filled 2013-01-19: qty 1

## 2013-01-19 MED ORDER — FLUCONAZOLE 150 MG PO TABS: 150.0000 mg | ORAL_TABLET | Freq: Once | ORAL | Status: DC

## 2013-01-19 NOTE — MAU Provider Note (Signed)
Chief Complaint: Vaginal Discharge   First Provider Initiated Contact with Patient 01/19/13 2015     SUBJECTIVE HPI: Janice Duarte is a 22 y.o. G0, LMP 12/12/12 who presents with missed period and malodorous vaginal discharge. Seen in [facility] ED yesterday for same. UPT neg. Has 28 day cycles normally.   Very concerned that she has not conceived in 2 cycles of trying to conceive. Has female partner and has been having intercourse w/ a female friend for conception. Wants infertility testing.   Past Medical History  Diagnosis Date  . Scoliosis   . Bacterial vaginal infection   . Headaches, cluster    OB History   Grav Para Term Preterm Abortions TAB SAB Ect Mult Living                 Past Surgical History  Procedure Laterality Date  . Wisdom tooth extraction     History   Social History  . Marital Status: Single    Spouse Name: N/A    Number of Children: N/A  . Years of Education: N/A   Occupational History  . Not on file.   Social History Main Topics  . Smoking status: Never Smoker   . Smokeless tobacco: Not on file  . Alcohol Use: No  . Drug Use: Yes    Special: Marijuana  . Sexually Active: Yes    Birth Control/ Protection: None   Other Topics Concern  . Not on file   Social History Narrative  . No narrative on file   No current facility-administered medications on file prior to encounter.   Current Outpatient Prescriptions on File Prior to Encounter  Medication Sig Dispense Refill  . ibuprofen (ADVIL,MOTRIN) 200 MG tablet Take 800 mg by mouth every 6 (six) hours as needed for pain (for migraine).        Allergies  Allergen Reactions  . Bactrim (Sulfamethoxazole W-Trimethoprim) Hives  . Penicillins Hives    ROS: Pertinent items in HPI  OBJECTIVE Blood pressure 135/97, pulse 69, temperature 98.6 F (37 C), temperature source Oral, resp. rate 18, height 5' 4.5" (1.638 m), weight 75.116 kg (165 lb 9.6 oz), last menstrual period 12/12/2012, SpO2  100.00%. GENERAL: Well-developed, well-nourished female in no acute distress.  HEENT: Normocephalic HEART: normal rate RESP: normal effort ABDOMEN: Soft, non-tender EXTREMITIES: Nontender, no edema NEURO: Alert and oriented SPECULUM EXAM: NEFG, small amount of thick, odorless discharge, no blood noted, cervix clean BIMANUAL: cervix closed; uterus normal size, no adnexal tenderness or masses  LAB RESULTS Results for orders placed during the hospital encounter of 01/19/13 (from the past 24 hour(s))  HCG, SERUM, QUALITATIVE     Status: None   Collection Time    01/19/13  6:50 PM      Result Value Range   Preg, Serum NEGATIVE  NEGATIVE  WET PREP, GENITAL     Status: Abnormal   Collection Time    01/19/13  8:23 PM      Result Value Range   Yeast Wet Prep HPF POC FEW (*) NONE SEEN   Trich, Wet Prep NONE SEEN  NONE SEEN   Clue Cells Wet Prep HPF POC NONE SEEN  NONE SEEN   WBC, Wet Prep HPF POC FEW (*) NONE SEEN    IMAGING No results found.  MAU COURSE  ASSESSMENT 1. Vulvovaginal candidiasis   2. Secondary amenorrhea    PLAN Discharge home. Discussed possible causes of secondary amenorrhea. Recommend repeating UPT if no period in 2 weeks. Discussed  that infertility is considered if no conception in 1 year of TTC. Discussed optimal timing of IC.  Safe sex. Consider donor sperm (tested for sperm count, STDs).     Follow-up Information   Follow up with Gynecologist. (for routine gynecology care)       Follow up with [facility] [place] MATERNITY ADMISS[facility]. (As needed for emergencies. )    Contact information:   [address] [place] [address] [phone]       Medication List    TAKE these medications       acetaminophen 500 MG tablet  Commonly known as:  TYLENOL  Take 1,000 mg by mouth every 6 (six) hours as needed for pain (For migraine.).     fluconazole 150 MG tablet  Commonly known as:  DIFLUCAN  Take 1 tablet (150 mg total) by  mouth once. If no improvement in 3 days, take second dose.     ibuprofen 200 MG tablet  Commonly known as:  ADVIL,MOTRIN  Take 800 mg by mouth every 6 (six) hours as needed for pain (for migraine).        [place], [place] 01/19/2013  9:23 PM

## 2013-01-19 NOTE — MAU Note (Signed)
Patient states she was seen at [facility] ED yesterday and felt like she did not get a complete evaluation. States she has been having a creamy vaginal discharge with no odor for about 2 weeks. Patient states she is actively trying to get pregnant with a female friend. Patient states she has had one episode of diarrhea this am. No vomiting. Had some irregular spotting since last period.

## 2013-01-20 LAB — GC/CHLAMYDIA PROBE AMP: CT Probe RNA: NEGATIVE

## 2013-01-20 NOTE — ED Provider Notes (Signed)
Medical screening examination/treatment/procedure(s) were performed by non-physician practitioner and as supervising physician I was immediately available for consultation/collaboration.   Laray Anger, DO 01/20/13 1606

## 2013-01-26 NOTE — MAU Provider Note (Signed)
Attestation of Attending Supervision of Advanced Practitioner: Evaluation and management procedures were performed by the PA/NP/CNM/OB Fellow under my supervision/collaboration. Chart reviewed and agree with management and plan.  Akirra Lacerda V 01/26/2013 4:11 PM

## 2013-02-02 ENCOUNTER — Inpatient Hospital Stay (HOSPITAL_COMMUNITY)
Admission: AD | Admit: 2013-02-02 | Discharge: 2013-02-02 | Disposition: A | Discharge status: Home / Self Care | Payer: Medicaid Other | Source: Ambulatory Visit | Attending: Obstetrics & Gynecology | Admitting: Obstetrics & Gynecology

## 2013-02-02 ENCOUNTER — Encounter (HOSPITAL_COMMUNITY): Payer: Self-pay | Admitting: *Deleted

## 2013-02-02 DIAGNOSIS — N898 Other specified noninflammatory disorders of vagina: Secondary | ICD-10-CM | POA: Insufficient documentation

## 2013-02-02 DIAGNOSIS — N940 Mittelschmerz: Secondary | ICD-10-CM

## 2013-02-02 DIAGNOSIS — R109 Unspecified abdominal pain: Secondary | ICD-10-CM | POA: Insufficient documentation

## 2013-02-02 LAB — POCT PREGNANCY, URINE: Preg Test, Ur: NEGATIVE

## 2013-02-02 LAB — URINALYSIS, ROUTINE W REFLEX MICROSCOPIC
Glucose, UA: NEGATIVE mg/dL
Leukocytes, UA: NEGATIVE
Specific Gravity, Urine: 1.015 (ref 1.005–1.030)
pH: 6 (ref 5.0–8.0)

## 2013-02-02 LAB — WET PREP, GENITAL: Yeast Wet Prep HPF POC: NONE SEEN

## 2013-02-02 NOTE — MAU Provider Note (Signed)
Chief Complaint: Vaginal Discharge   First Provider Initiated Contact with Patient 02/02/13 2147     SUBJECTIVE HPI: Janice Duarte is a 22 y.o.  G0 LMP 01/20/13 who presents with mild low abd cramping and green vaginal discharge since last night. Denies vaginal odor, fever, chills, dyspareunia, urinary complaints, GI complaints. In relationship w/ same sex partner, but TTC w/ female friend. Last IC 12/20/12.   Past Medical History  Diagnosis Date  . Scoliosis   . Bacterial vaginal infection   . Headaches, cluster    OB History   Grav Para Term Preterm Abortions TAB SAB Ect Mult Living                 Past Surgical History  Procedure Laterality Date  . Wisdom tooth extraction     History   Social History  . Marital Status: Single    Spouse Name: N/A    Number of Children: N/A  . Years of Education: N/A   Occupational History  . Not on file.   Social History Main Topics  . Smoking status: Never Smoker   . Smokeless tobacco: Not on file  . Alcohol Use: No  . Drug Use: Yes    Special: Marijuana  . Sexually Active: Yes    Birth Control/ Protection: None   Other Topics Concern  . Not on file   Social History Narrative  . No narrative on file   No current facility-administered medications on file prior to encounter.   Current Outpatient Prescriptions on File Prior to Encounter  Medication Sig Dispense Refill  . acetaminophen (TYLENOL) 500 MG tablet Take 1,000 mg by mouth every 6 (six) hours as needed for pain (For migraine.).      Marland Kitchen fluconazole (DIFLUCAN) 150 MG tablet Take 1 tablet (150 mg total) by mouth once. If no improvement in 3 days, take second dose.  1 tablet  1   Allergies  Allergen Reactions  . Bactrim (Sulfamethoxazole W-Trimethoprim) Hives  . Penicillins Hives    ROS: Pertinent items in HPI  OBJECTIVE Blood pressure 115/73, pulse 85, temperature 98.1 F (36.7 C), temperature source Oral, resp. rate 16, height 5\' 6"  (1.676 m), weight 76.261 kg  (168 lb 2 oz), last menstrual period 01/20/2013, SpO2 100.00%. GENERAL: Well-developed, well-nourished female in no acute distress.  HEENT: Normocephalic HEART: normal rate RESP: normal effort ABDOMEN: Soft, non-tender. Pos BS x 4.  EXTREMITIES: Nontender, no edema NEURO: Alert and oriented SPECULUM EXAM: NEFG, clear, physiologic discharge, no odor, no blood noted, cervix clean BIMANUAL: cervix closed; uterus normal size, no adnexal tenderness or masses. No CMT.   LAB RESULTS Results for orders placed during the hospital encounter of 02/02/13 (from the past 24 hour(s))  URINALYSIS, ROUTINE W REFLEX MICROSCOPIC     Status: None   Collection Time    02/02/13  8:55 PM      Result Value Range   Color, Urine YELLOW  YELLOW   APPearance CLEAR  CLEAR   Specific Gravity, Urine 1.015  1.005 - 1.030   pH 6.0  5.0 - 8.0   Glucose, UA NEGATIVE  NEGATIVE mg/dL   Hgb urine dipstick NEGATIVE  NEGATIVE   Bilirubin Urine NEGATIVE  NEGATIVE   Ketones, ur NEGATIVE  NEGATIVE mg/dL   Protein, ur NEGATIVE  NEGATIVE mg/dL   Urobilinogen, UA 0.2  0.0 - 1.0 mg/dL   Nitrite NEGATIVE  NEGATIVE   Leukocytes, UA NEGATIVE  NEGATIVE  POCT PREGNANCY, URINE     Status:  None   Collection Time    02/02/13  9:05 PM      Result Value Range   Preg Test, Ur NEGATIVE  NEGATIVE  WET PREP, GENITAL     Status: Abnormal   Collection Time    02/02/13  9:52 PM      Result Value Range   Yeast Wet Prep HPF POC NONE SEEN  NONE SEEN   Trich, Wet Prep NONE SEEN  NONE SEEN   Clue Cells Wet Prep HPF POC NONE SEEN  NONE SEEN   WBC, Wet Prep HPF POC FEW (*) NONE SEEN    IMAGING No results found.  MAU COURSE  ASSESSMENT 1. Ovulatory pain    PLAN Discharge home in stable condition. Ibuprofen PRN for Pain.  Discussed timing of ovulation, normal changes in vaginal discharge w/ ovulation and mittelschmerz.       Follow-up Information   Follow up with Gynecologist. (for routine care and as needed)       Follow up  with [facility] [place] MATERNITY ADMISSIONS. (As needed emergencies)    Contact information:   [address] [place] [address] [phone]       Medication List    STOP taking these medications       ibuprofen 200 MG tablet  Commonly known as:  ADVIL,MOTRIN      TAKE these medications       acetaminophen 500 MG tablet  Commonly known as:  TYLENOL  Take 1,000 mg by mouth every 6 (six) hours as needed for pain (For migraine.).     fluconazole 150 MG tablet  Commonly known as:  DIFLUCAN  Take 1 tablet (150 mg total) by mouth once. If no improvement in 3 days, take second dose.         [place], [place] 02/02/2013  10:29 PM

## 2013-02-02 NOTE — MAU Note (Signed)
Pt states she had unprotected sex on March 29th-had a heavy period around April 24th- now is concerned about green dishcarge and cramping

## 2013-02-03 LAB — GC/CHLAMYDIA PROBE AMP
CT Probe RNA: NEGATIVE
GC Probe RNA: NEGATIVE

## 2013-02-23 ENCOUNTER — Emergency Department (HOSPITAL_COMMUNITY)
Admission: EM | Admit: 2013-02-23 | Discharge: 2013-02-23 | Disposition: A | Discharge status: Home / Self Care | Payer: Medicaid Other | Attending: Emergency Medicine | Admitting: Emergency Medicine

## 2013-02-23 ENCOUNTER — Encounter (HOSPITAL_COMMUNITY): Payer: Self-pay | Admitting: Emergency Medicine

## 2013-02-23 DIAGNOSIS — Z8619 Personal history of other infectious and parasitic diseases: Secondary | ICD-10-CM | POA: Insufficient documentation

## 2013-02-23 DIAGNOSIS — L0291 Cutaneous abscess, unspecified: Secondary | ICD-10-CM

## 2013-02-23 DIAGNOSIS — L02419 Cutaneous abscess of limb, unspecified: Secondary | ICD-10-CM | POA: Insufficient documentation

## 2013-02-23 DIAGNOSIS — Z8739 Personal history of other diseases of the musculoskeletal system and connective tissue: Secondary | ICD-10-CM | POA: Insufficient documentation

## 2013-02-23 DIAGNOSIS — Z3202 Encounter for pregnancy test, result negative: Secondary | ICD-10-CM | POA: Insufficient documentation

## 2013-02-23 DIAGNOSIS — IMO0002 Reserved for concepts with insufficient information to code with codable children: Secondary | ICD-10-CM | POA: Insufficient documentation

## 2013-02-23 DIAGNOSIS — Z880 Allergy status to penicillin: Secondary | ICD-10-CM | POA: Insufficient documentation

## 2013-02-23 MED ORDER — HYDROCODONE-ACETAMINOPHEN 5-325 MG PO TABS: 1.0000 | ORAL_TABLET | Freq: Four times a day (QID) | ORAL | Status: DC | PRN

## 2013-02-23 MED ORDER — CLINDAMYCIN HCL 150 MG PO CAPS: 300.0000 mg | ORAL_CAPSULE | Freq: Three times a day (TID) | ORAL | Status: DC

## 2013-02-23 NOTE — ED Provider Notes (Signed)
History     CSN: [id number]  Arrival date & time 02/23/13  1146   First MD Initiated Contact with Patient 02/23/13 1209      Chief Complaint  Patient presents with  . Recurrent Skin Infections    (Consider location/radiation/quality/duration/timing/severity/associated sxs/prior treatment) HPI Janice Duarte is a 22 y.o. female who presents to ED complaint of several abscesses. States one large one on left hip, onset several days ago, with surrounding erythema, swelling, very painful. States one small one in right axilla and one over her pubis. States hx of the same. No fever, chills, malaise. Tried warm compresses with no relief. No other complaints.    Past Medical History  Diagnosis Date  . Scoliosis   . Bacterial vaginal infection   . Headaches, cluster     Past Surgical History  Procedure Laterality Date  . Wisdom tooth extraction      No family history on file.  History  Substance Use Topics  . Smoking status: Never Smoker   . Smokeless tobacco: Not on file  . Alcohol Use: No    OB History   Grav Para Term Preterm Abortions TAB SAB Ect Mult Living                  Review of Systems  Constitutional: Negative for fever and chills.  Skin: Positive for wound.    Allergies  Bactrim and Penicillins  Home Medications   Current Outpatient Rx  Name  Route  Sig  Dispense  Refill  . acetaminophen (TYLENOL) 500 MG tablet   Oral   Take 1,000 mg by mouth every 6 (six) hours as needed for pain (For migraine.).         Marland Kitchen fluconazole (DIFLUCAN) 150 MG tablet   Oral   Take 1 tablet (150 mg total) by mouth once. If no improvement in 3 days, take second dose.   1 tablet   1     BP 131/80  Pulse 80  Temp(Src) 98.6 F (37 C)  Resp 16  SpO2 99%  LMP 01/20/2013  Physical Exam  Nursing note and vitals reviewed. Constitutional: She appears well-developed and well-nourished. No distress.  Neck: Neck supple.  Cardiovascular: Normal rate, regular rhythm  and normal heart sounds.   Pulmonary/Chest: Effort normal and breath sounds normal. No respiratory distress. She has no wheezes. She has no rales.  Neurological: She is alert.  Skin: Skin is warm and dry.  Small <1cm pustule to the right axilla. Small <1cm pustule over pubic bone. 3cm Abscess with surrounding erythema, tenderness, fluctuance, induration to the left hip. No drainage     ED Course  Procedures (including critical care time)  INCISION AND DRAINAGE Performed by: Jaynie Crumble A Consent: Verbal consent obtained. Risks and benefits: risks, benefits and alternatives were discussed Type: abscess  Body area: left hip  Anesthesia: local infiltration  Incision was made with a scalpel.  Local anesthetic: lidocaine 2% w epinephrine  Anesthetic total: 3 ml  Complexity: complex Blunt dissection to break up loculations  Drainage: purulent  Drainage amount: large  Packing material: no packing  Patient tolerance: Patient tolerated the procedure well with no immediate complications.     1. Skin abscess       MDM  Pt with several abscess. Two small ones, do not require I&D at this time. Did drain the one on left hip with large purulent drainage. Pt tolerated this well. There is some surrounding erythema and swelling. Pt afebrile otherwise non  toxic. Will start on clindamycin for infection. Warm compresses and wound care at home. Follow up as needed.   Filed Vitals:   02/23/13 1152  BP: 131/80  Pulse: 80  Temp: 98.6 F (37 C)  Resp: 16  SpO2: 99%           Seaton Hofmann A Yazleemar Strassner, PA-C 02/23/13 1332

## 2013-02-23 NOTE — ED Notes (Signed)
States needs preg test and has a boil on her left butt cheek that needs to be looked at

## 2013-02-23 NOTE — ED Notes (Signed)
Kirchenko, PA at bedside to I & D abscess.

## 2013-02-25 NOTE — ED Provider Notes (Signed)
Medical screening examination/treatment/procedure(s) were performed by non-physician practitioner and as supervising physician I was immediately available for consultation/collaboration.   David H Yao, MD 02/25/13 1538 

## 2013-02-26 ENCOUNTER — Emergency Department (HOSPITAL_COMMUNITY)
Admission: EM | Admit: 2013-02-26 | Discharge: 2013-02-26 | Disposition: A | Discharge status: Home / Self Care | Payer: Medicaid Other | Attending: Emergency Medicine | Admitting: Emergency Medicine

## 2013-02-26 ENCOUNTER — Emergency Department (HOSPITAL_COMMUNITY): Payer: Medicaid Other

## 2013-02-26 ENCOUNTER — Encounter (HOSPITAL_COMMUNITY): Payer: Self-pay | Admitting: Emergency Medicine

## 2013-02-26 DIAGNOSIS — Z8619 Personal history of other infectious and parasitic diseases: Secondary | ICD-10-CM | POA: Insufficient documentation

## 2013-02-26 DIAGNOSIS — S99922A Unspecified injury of left foot, initial encounter: Secondary | ICD-10-CM

## 2013-02-26 DIAGNOSIS — Y929 Unspecified place or not applicable: Secondary | ICD-10-CM | POA: Insufficient documentation

## 2013-02-26 DIAGNOSIS — Z872 Personal history of diseases of the skin and subcutaneous tissue: Secondary | ICD-10-CM | POA: Insufficient documentation

## 2013-02-26 DIAGNOSIS — Y9301 Activity, walking, marching and hiking: Secondary | ICD-10-CM | POA: Insufficient documentation

## 2013-02-26 DIAGNOSIS — IMO0002 Reserved for concepts with insufficient information to code with codable children: Secondary | ICD-10-CM | POA: Insufficient documentation

## 2013-02-26 DIAGNOSIS — S8990XA Unspecified injury of unspecified lower leg, initial encounter: Secondary | ICD-10-CM | POA: Insufficient documentation

## 2013-02-26 DIAGNOSIS — Z880 Allergy status to penicillin: Secondary | ICD-10-CM | POA: Insufficient documentation

## 2013-02-26 DIAGNOSIS — S99929A Unspecified injury of unspecified foot, initial encounter: Secondary | ICD-10-CM | POA: Insufficient documentation

## 2013-02-26 DIAGNOSIS — Z8739 Personal history of other diseases of the musculoskeletal system and connective tissue: Secondary | ICD-10-CM | POA: Insufficient documentation

## 2013-02-26 HISTORY — DX: Cutaneous abscess, unspecified: L02.91

## 2013-02-26 MED ORDER — NAPROXEN 250 MG PO TABS
500.0000 mg | ORAL_TABLET | Freq: Once | ORAL | Status: AC
  Administered 2013-02-26: 500 mg via ORAL
  Filled 2013-02-26: qty 2

## 2013-02-26 MED ORDER — NAPROXEN 500 MG PO TABS: 500.0000 mg | ORAL_TABLET | Freq: Two times a day (BID) | ORAL | Status: DC

## 2013-02-26 NOTE — ED Notes (Signed)
Pt dc to home. Pt toes buddy taped and post op shoe placed. Pt ambulatory to exit without difficulty. Pt denies need for w/c.

## 2013-02-26 NOTE — ED Provider Notes (Signed)
History    This chart was scribed for a non-physician practitioner, Dierdre Forth, PA-C, working with Ward Givens, MD by Frederik Pear, ED Scribe. This patient was seen in room TR05C/TR05C and the patient's care was started at 1907.   CSN: [id number]  Arrival date & time 02/26/13  1815   First MD Initiated Contact with Patient 02/26/13 1907      Chief Complaint  Patient presents with  . Toe Pain    (Consider location/radiation/quality/duration/timing/severity/associated sxs/prior treatment) Patient is a 22 y.o. female presenting with toe pain. The history is provided by the patient and medical records.  Toe Pain This is a new problem. The current episode started 1 to 2 hours ago. The problem occurs constantly. The problem has not changed since onset.Pertinent negatives include no headaches. The symptoms are aggravated by bending. Nothing relieves the symptoms. Treatments tried: tramadol. The treatment provided no relief.    HPI Comments: DARNESHA DILORETO is a 22 y.o. female who presents to the Emergency Department complaining of sudden onset, constant, severe left great toe pain that began at 17:53 when she tripped over her flipflops while walking and stubbed her toe on the tire of her car. She denies any other symptoms. She states that she treated the pain with tramadol with no relief.   Past Medical History  Diagnosis Date  . Scoliosis   . Bacterial vaginal infection   . Headaches, cluster   . Abscess     Past Surgical History  Procedure Laterality Date  . Wisdom tooth extraction      History reviewed. No pertinent family history.  History  Substance Use Topics  . Smoking status: Never Smoker   . Smokeless tobacco: Not on file  . Alcohol Use: No    OB History   Grav Para Term Preterm Abortions TAB SAB Ect Mult Living                  Review of Systems  HENT: Negative for neck pain and neck stiffness.   Musculoskeletal: Positive for arthralgias. Negative  for back pain.  Skin: Negative for wound.  Neurological: Negative for numbness and headaches.  All other systems reviewed and are negative.    Allergies  Bactrim and Penicillins  Home Medications   Current Outpatient Rx  Name  Route  Sig  Dispense  Refill  . clindamycin (CLEOCIN) 150 MG capsule   Oral   Take 300 mg by mouth 3 (three) times daily.         Marland Kitchen HYDROcodone-acetaminophen (NORCO/VICODIN) 5-325 MG per tablet   Oral   Take 1 tablet by mouth every 6 (six) hours as needed for pain.         . naproxen (NAPROSYN) 500 MG tablet   Oral   Take 1 tablet (500 mg total) by mouth 2 (two) times daily with a meal.   30 tablet   0     BP 124/81  Pulse 85  Temp(Src) 98.4 F (36.9 C) (Oral)  Resp 16  SpO2 100%  LMP 02/23/2013  Physical Exam  Nursing note and vitals reviewed. Constitutional: She appears well-developed and well-nourished. No distress.  HENT:  Head: Normocephalic and atraumatic.  Eyes: Conjunctivae are normal.  Neck: Normal range of motion.  Cardiovascular: Normal rate, regular rhythm and intact distal pulses.   Pulses:      Dorsalis pedis pulses are 2+ on the left side.       Posterior tibial pulses are 2+ on the left  side.  Capillary refill is less than 3 seconds.  Pulmonary/Chest: Effort normal and breath sounds normal.  Musculoskeletal: She exhibits tenderness. She exhibits no edema.       Left ankle: She exhibits decreased range of motion. She exhibits no swelling, no ecchymosis, no deformity, no laceration and normal pulse. Achilles tendon exhibits no pain, no defect and normal Thompson's test results.       Feet:  ROM: Full ROM of all extremities. Decreased ROM of the left great toe secondary to pain.   Neurological: She is alert. Coordination normal. GCS eye subscore is 4. GCS verbal subscore is 5. GCS motor subscore is 6.  Reflex Scores:      Patellar reflexes are 2+ on the right side and 2+ on the left side.      Achilles reflexes are 2+  on the right side and 2+ on the left side. Sensation intact to dull and sharp Strength decreased 2/2 pain  Skin: Skin is warm and dry. She is not diaphoretic.  No tenting of the skin. No swelling. No ecchymosis, No lacerations.  Psychiatric: She has a normal mood and affect.    ED Course  Procedures (including critical care time)  DIAGNOSTIC STUDIES: Oxygen Saturation is 100% on room air, normal by my interpretation.    COORDINATION OF CARE:  19:52- Discussed planned course of treatment with the patient, including a postop boot, naproxen, and buddy taping the toes, who is agreeable at this time.  20:00- Medication Orders- naproxen (naprosyn) tablet 500 mg- once.  Labs Reviewed - No data to display Dg Foot Complete Left  02/26/2013   *RADIOLOGY REPORT*  Clinical Data: Trauma to the left foot.  Pain at the first toe and marked tarsal.  LEFT FOOT - COMPLETE 3+ VIEW  Comparison: None.  Findings: No acute bone or soft tissue abnormality is present.  No radiopaque foreign body is present.  IMPRESSION: Negative left foot radiographs.   Original Report Authenticated By: Marin Roberts, M.D.     1. Injury of toe on left foot, initial encounter       MDM  Serinity S Minish presents after stubbing toe on a tire.  Patient X-Ray negative for obvious fracture or dislocation. I personally reviewed the imaging tests through PACS system.  I reviewed available ER/hospitalization records through the EMR.  Pain managed in ED. Pt advised to follow up with orthopedics if symptoms persist for possibility of missed fracture diagnosis. Patient given buddy tape and postop shoe while in ED, conservative therapy recommended and discussed. Patient will be dc home & is agreeable with above plan.  I personally performed the services described in this documentation, which was scribed in my presence. The recorded information has been reviewed and is accurate.   Dahlia Client Shantil Vallejo, PA-C 02/26/13 2003

## 2013-02-26 NOTE — ED Notes (Signed)
Pt c/o left great toe pain after stubbing 2 hours ago

## 2013-02-27 NOTE — ED Provider Notes (Signed)
Medical screening examination/treatment/procedure(s) were performed by non-physician practitioner and as supervising physician I was immediately available for consultation/collaboration. Briahnna Harries, MD, FACEP   Denecia Brunette L Sirinity Outland, MD 02/27/13 0017 

## 2013-06-25 ENCOUNTER — Emergency Department (HOSPITAL_COMMUNITY)
Admission: EM | Admit: 2013-06-25 | Discharge: 2013-06-25 | Disposition: A | Discharge status: Home / Self Care | Payer: Medicaid Other | Attending: Emergency Medicine | Admitting: Emergency Medicine

## 2013-06-25 ENCOUNTER — Encounter (HOSPITAL_COMMUNITY): Payer: Self-pay | Admitting: *Deleted

## 2013-06-25 DIAGNOSIS — L732 Hidradenitis suppurativa: Secondary | ICD-10-CM

## 2013-06-25 DIAGNOSIS — Z87440 Personal history of urinary (tract) infections: Secondary | ICD-10-CM | POA: Insufficient documentation

## 2013-06-25 DIAGNOSIS — Z8679 Personal history of other diseases of the circulatory system: Secondary | ICD-10-CM | POA: Insufficient documentation

## 2013-06-25 DIAGNOSIS — Z8739 Personal history of other diseases of the musculoskeletal system and connective tissue: Secondary | ICD-10-CM | POA: Insufficient documentation

## 2013-06-25 MED ORDER — OXYCODONE-ACETAMINOPHEN 5-325 MG PO TABS
1.0000 | ORAL_TABLET | Freq: Once | ORAL | Status: AC
  Administered 2013-06-25: 1 via ORAL
  Filled 2013-06-25: qty 1

## 2013-06-25 MED ORDER — OXYCODONE-ACETAMINOPHEN 5-325 MG PO TABS: 1.0000 | ORAL_TABLET | Freq: Four times a day (QID) | ORAL | Status: DC | PRN

## 2013-06-25 NOTE — ED Provider Notes (Signed)
CSN: [id number]     Arrival date & time 06/25/13  0122 History   First MD Initiated Contact with Patient 06/25/13 0131     Chief Complaint  Patient presents with  . boil under rt arm    (Consider location/radiation/quality/duration/timing/severity/associated sxs/prior Treatment) HPI Comments: Patient with a history of,  Hidradenitis  , and multiple abscesses, now has an abscess in the right axilla for the past 3, weeks, gradually getting worse  The history is provided by the patient.    Past Medical History  Diagnosis Date  . Scoliosis   . Bacterial vaginal infection   . Headaches, cluster   . Abscess    Past Surgical History  Procedure Laterality Date  . Wisdom tooth extraction     No family history on file. History  Substance Use Topics  . Smoking status: Never Smoker   . Smokeless tobacco: Not on file  . Alcohol Use: No   OB History   Grav Para Term Preterm Abortions TAB SAB Ect Mult Living                 Review of Systems  Constitutional: Negative for fever and chills.  Skin: Positive for wound.  All other systems reviewed and are negative.    Allergies  Bactrim and Penicillins  Home Medications   Current Outpatient Rx  Name  Route  Sig  Dispense  Refill  . acetaminophen (TYLENOL) 325 MG tablet   Oral   Take 650 mg by mouth every 6 (six) hours as needed for pain.         Marland Kitchen oxyCODONE-acetaminophen (PERCOCET/ROXICET) 5-325 MG per tablet   Oral   Take 1 tablet by mouth every 6 (six) hours as needed for pain.   12 tablet   0    BP 119/74  Pulse 82  Temp(Src) 99 F (37.2 C)  Resp 18  SpO2 98% Physical Exam  Vitals reviewed. Constitutional: She is oriented to person, place, and time. She appears well-developed and well-nourished.  HENT:  Head: Normocephalic.  Eyes: Pupils are equal, round, and reactive to light.  Neck: Normal range of motion.  Cardiovascular: Normal rate and regular rhythm.   Pulmonary/Chest: Effort normal.   Musculoskeletal: Normal range of motion. She exhibits tenderness. She exhibits no edema.  Neurological: She is alert and oriented to person, place, and time.  Skin: Skin is warm. No erythema.  2 cm x 1 cm, area of fluctuance in the right axilla    ED Course  INCISION AND DRAINAGE Date/Time: 06/25/2013 2:35 AM Performed by: Arman Filter Authorized by: Arman Filter Consent: Verbal consent obtained. written consent not obtained. Risks and benefits: risks, benefits and alternatives were discussed Consent given by: patient Patient understanding: patient states understanding of the procedure being performed Patient identity confirmed: verbally with patient Type: abscess Body area: upper extremity (Right axilla) Anesthesia: local infiltration Local anesthetic: lidocaine 1% with epinephrine Anesthetic total: 2 ml Patient sedated: no Scalpel size: 11 Incision type: single straight Complexity: simple Drainage: purulent Drainage amount: copious Wound treatment: wound left open Packing material: 1/4 in iodoform gauze Patient tolerance: Patient tolerated the procedure well with no immediate complications.   (including critical care time) Labs Review Labs Reviewed - No data to display Imaging Review No results found.  MDM   1. Hydradenitis         Arman Filter, NP 06/25/13 0236  Arman Filter, NP 06/25/13 0236

## 2013-06-25 NOTE — ED Notes (Signed)
The pt has a boil under her rt arm for 3 weeks

## 2013-06-25 NOTE — ED Provider Notes (Signed)
Medical screening examination/treatment/procedure(s) were performed by non-physician practitioner and as supervising physician I was immediately available for consultation/collaboration.   Takaya Hyslop H Maricia Scotti, MD 06/25/13 0723 

## 2013-07-14 ENCOUNTER — Emergency Department (HOSPITAL_COMMUNITY)
Admission: EM | Admit: 2013-07-14 | Discharge: 2013-07-14 | Disposition: A | Discharge status: Home / Self Care | Payer: Medicaid Other | Attending: Emergency Medicine | Admitting: Emergency Medicine

## 2013-07-14 DIAGNOSIS — N61 Mastitis without abscess: Secondary | ICD-10-CM | POA: Insufficient documentation

## 2013-07-14 DIAGNOSIS — Z880 Allergy status to penicillin: Secondary | ICD-10-CM | POA: Insufficient documentation

## 2013-07-14 DIAGNOSIS — M549 Dorsalgia, unspecified: Secondary | ICD-10-CM | POA: Insufficient documentation

## 2013-07-14 DIAGNOSIS — Z87440 Personal history of urinary (tract) infections: Secondary | ICD-10-CM | POA: Insufficient documentation

## 2013-07-14 MED ORDER — NAPROXEN 500 MG PO TABS: 500.0000 mg | ORAL_TABLET | Freq: Two times a day (BID) | ORAL | Status: DC

## 2013-07-14 MED ORDER — KETOROLAC TROMETHAMINE 60 MG/2ML IM SOLN
60.0000 mg | Freq: Once | INTRAMUSCULAR | Status: AC
  Administered 2013-07-14: 60 mg via INTRAMUSCULAR
  Filled 2013-07-14: qty 2

## 2013-07-14 MED ORDER — CYCLOBENZAPRINE HCL 10 MG PO TABS: 10.0000 mg | ORAL_TABLET | Freq: Two times a day (BID) | ORAL | Status: DC | PRN

## 2013-07-14 MED ORDER — DIAZEPAM 5 MG PO TABS
5.0000 mg | ORAL_TABLET | Freq: Once | ORAL | Status: AC
  Administered 2013-07-14: 5 mg via ORAL
  Filled 2013-07-14: qty 1

## 2013-07-14 MED ORDER — CLINDAMYCIN HCL 150 MG PO CAPS: 300.0000 mg | ORAL_CAPSULE | Freq: Three times a day (TID) | ORAL | Status: DC

## 2013-07-14 NOTE — ED Provider Notes (Signed)
CSN: [id number]     Arrival date & time 07/14/13  2014 History  This chart was scribed for non-physician practitioner Mckinley Jewel, PA-C working with Vida Roller, MD by Clydene Laming, ED Scribe. This patient was seen in room TR07C/TR07C and the patient's care was started at 9:46 PM.   Chief Complaint  Patient presents with  . Back Pain   The history is provided by the patient. No language interpreter was used.   HPI Comments: Janice Duarte is a 22 y.o. female who presents to the Emergency Department complaining of severe back pain onset today. Pt denies any fall or related injuries that caused pain. She denies numbness or tingling. Pt also presents with an abscess under the right breast. Pt has ax of scoliosis and abscesses.   Past Medical History  Diagnosis Date  . Scoliosis   . Bacterial vaginal infection   . Headaches, cluster   . Abscess    Past Surgical History  Procedure Laterality Date  . Wisdom tooth extraction     No family history on file. History  Substance Use Topics  . Smoking status: Never Smoker   . Smokeless tobacco: Not on file  . Alcohol Use: No   OB History   Grav Para Term Preterm Abortions TAB SAB Ect Mult Living                 Review of Systems  Musculoskeletal: Positive for back pain.  Skin: Positive for rash (Abscess).  All other systems reviewed and are negative.    Allergies  Bactrim and Penicillins  Home Medications   Current Outpatient Rx  Name  Route  Sig  Dispense  Refill  . ibuprofen (ADVIL,MOTRIN) 800 MG tablet   Oral   Take 800 mg by mouth every 8 (eight) hours as needed for pain.          Triage Vitals:BP 121/77  Pulse 90  Temp(Src) 98.4 F (36.9 C)  Resp 20  Ht 5' 5.5" (1.664 m)  Wt 160 lb 1 oz (72.604 kg)  BMI 26.22 kg/m2  SpO2 100% Physical Exam  Nursing note and vitals reviewed. Constitutional: She is oriented to person, place, and time. She appears well-developed and well-nourished. No distress.  HENT:   Head: Normocephalic and atraumatic.  Eyes: EOM are normal.  Neck: Neck supple. No tracheal deviation present.  Cardiovascular: Normal rate.   Pulmonary/Chest: Effort normal. No respiratory distress.  Musculoskeletal: Normal range of motion.  Neurological: She is alert and oriented to person, place, and time.  Skin: Skin is warm and dry.  Psychiatric: She has a normal mood and affect. Her behavior is normal.    ED Course  Procedures (including critical care time) DIAGNOSTIC STUDIES: Oxygen Saturation is 100% on RA, normal by my interpretation.    COORDINATION OF CARE: 9:52 PM- Discussed treatment plan with pt at bedside. Pt verbalized understanding and agreement with plan.   Labs Review Labs Reviewed - No data to display Imaging Review No results found.  EKG Interpretation   None       MDM   1. Back pain   2. Cellulitis of female breast    11:46 PM Patient will be treated with Naprosyn and Flexeril for back pain. Patient will also have Clindamycin for cellulitis of breast. No drainable abscess noted. Vitals stable and patient afebrile. No further evaluation needed at this time. Patient instructed to return with worsening or concerning symptoms.   I personally performed the services described in  this documentation, which was scribed in my presence. The recorded information has been reviewed and is accurate.     Emilia Beck, PA-C 07/14/13 2348

## 2013-07-14 NOTE — ED Provider Notes (Signed)
Medical screening examination/treatment/procedure(s) were performed by non-physician practitioner and as supervising physician I was immediately available for consultation/collaboration.    Vida Roller, MD 07/14/13 [phone]

## 2013-07-14 NOTE — ED Notes (Signed)
Lower mid back pain; took 800 mg ibuprofen with no relief. Chronic back pain hx.

## 2013-08-26 ENCOUNTER — Encounter (HOSPITAL_COMMUNITY): Payer: Self-pay | Admitting: *Deleted

## 2013-08-26 ENCOUNTER — Inpatient Hospital Stay (HOSPITAL_COMMUNITY)
Admission: AD | Admit: 2013-08-26 | Discharge: 2013-08-26 | Disposition: A | Discharge status: Home / Self Care | Payer: Medicaid Other | Source: Ambulatory Visit | Attending: Obstetrics and Gynecology | Admitting: Obstetrics and Gynecology

## 2013-08-26 DIAGNOSIS — R300 Dysuria: Secondary | ICD-10-CM | POA: Insufficient documentation

## 2013-08-26 DIAGNOSIS — N390 Urinary tract infection, site not specified: Secondary | ICD-10-CM | POA: Insufficient documentation

## 2013-08-26 LAB — URINE MICROSCOPIC-ADD ON

## 2013-08-26 LAB — URINALYSIS, ROUTINE W REFLEX MICROSCOPIC
Bilirubin Urine: NEGATIVE
Specific Gravity, Urine: 1.02 (ref 1.005–1.030)
pH: 7 (ref 5.0–8.0)

## 2013-08-26 LAB — WET PREP, GENITAL: Trich, Wet Prep: NONE SEEN

## 2013-08-26 LAB — POCT PREGNANCY, URINE: Preg Test, Ur: NEGATIVE

## 2013-08-26 MED ORDER — FLUCONAZOLE 150 MG PO TABS: 150.0000 mg | ORAL_TABLET | Freq: Every day | ORAL | Status: DC

## 2013-08-26 MED ORDER — CIPROFLOXACIN HCL 500 MG PO TABS: 500.0000 mg | ORAL_TABLET | Freq: Two times a day (BID) | ORAL | Status: DC

## 2013-08-26 NOTE — MAU Note (Signed)
Patient presents to MAU with c/o dysuria and urinary frequency since Tuesday. Reports noticing bright red blood when wipes.

## 2013-08-26 NOTE — MAU Provider Note (Signed)
History     CSN: [id number]  Arrival date and time: 08/26/13 1406   First Provider Initiated Contact with Patient 08/26/13 1556      Chief Complaint  Patient presents with  . Urinary Tract Infection   HPI Comments: Janice Duarte 22 y.o. No obstetric history on file. Presents to MAU for sx of UTI. She is seeing bllod in her urine and has painful urination. Same sex partner.     Urinary Tract Infection  Associated symptoms include hematuria and urgency.      Past Medical History  Diagnosis Date  . Scoliosis   . Bacterial vaginal infection   . Headaches, cluster   . Abscess     Past Surgical History  Procedure Laterality Date  . Wisdom tooth extraction      History reviewed. No pertinent family history.  History  Substance Use Topics  . Smoking status: Never Smoker   . Smokeless tobacco: Not on file  . Alcohol Use: No    Allergies:  Allergies  Allergen Reactions  . Bactrim [Sulfamethoxazole-Trimethoprim] Hives  . Penicillins Hives    Prescriptions prior to admission  Medication Sig Dispense Refill  . Aspirin-Salicylamide-Caffeine (BC HEADACHE POWDER PO) Take 1 Package by mouth daily as needed (pain).      . cyclobenzaprine (FLEXERIL) 10 MG tablet Take 1 tablet (10 mg total) by mouth 2 (two) times daily as needed for muscle spasms.  20 tablet  0    Review of Systems  Constitutional: Negative.   HENT: Negative.   Eyes: Negative.   Respiratory: Negative.   Cardiovascular: Negative.   Gastrointestinal: Negative.   Genitourinary: Positive for dysuria, urgency and hematuria.  Skin: Negative.   Psychiatric/Behavioral: Negative.    Physical Exam   Blood pressure 110/58, pulse 89, temperature 98.5 F (36.9 C), temperature source Oral, resp. rate 18, height 5' 5.5" (1.664 m), weight 72.666 kg (160 lb 3.2 oz), last menstrual period 07/31/2013, SpO2 100.00%.  Physical Exam  Constitutional: She is oriented to person, place, and time. She appears  well-developed and well-nourished. No distress.  HENT:  Head: Normocephalic and atraumatic.  Eyes: Pupils are equal, round, and reactive to light.  Cardiovascular: Normal rate, regular rhythm and normal heart sounds.   Respiratory: Effort normal and breath sounds normal. No respiratory distress. She has no wheezes.  GI: Soft. Bowel sounds are normal. She exhibits no distension. There is tenderness.  Tenderness over bladder  Genitourinary:  Genitalia: External: negative Vagina: small amt white discharge Cervix: negative Biman: tender over bladder  Musculoskeletal: Normal range of motion.  Neurological: She is alert and oriented to person, place, and time.  Skin: Skin is warm and dry.  Psychiatric: She has a normal mood and affect. Her behavior is normal. Judgment and thought content normal.   Results for orders placed during the hospital encounter of 08/26/13 (from the past 24 hour(s))  URINALYSIS, ROUTINE W REFLEX MICROSCOPIC     Status: Abnormal   Collection Time    08/26/13  2:20 PM      Result Value Range   Color, Urine YELLOW  YELLOW   APPearance CLOUDY (*) CLEAR   Specific Gravity, Urine 1.020  1.005 - 1.030   pH 7.0  5.0 - 8.0   Glucose, UA NEGATIVE  NEGATIVE mg/dL   Hgb urine dipstick LARGE (*) NEGATIVE   Bilirubin Urine NEGATIVE  NEGATIVE   Ketones, ur NEGATIVE  NEGATIVE mg/dL   Protein, ur 30 (*) NEGATIVE mg/dL   Urobilinogen, UA 0.2  0.0 - 1.0 mg/dL   Nitrite POSITIVE (*) NEGATIVE   Leukocytes, UA LARGE (*) NEGATIVE  URINE MICROSCOPIC-ADD ON     Status: Abnormal   Collection Time    08/26/13  2:20 PM      Result Value Range   Squamous Epithelial / LPF FEW (*) RARE   WBC, UA 21-50  <3 WBC/hpf   RBC / HPF 3-6  <3 RBC/hpf   Bacteria, UA FEW (*) RARE  POCT PREGNANCY, URINE     Status: None   Collection Time    08/26/13  2:33 PM      Result Value Range   Preg Test, Ur NEGATIVE  NEGATIVE  WET PREP, GENITAL     Status: Abnormal   Collection Time    08/26/13  4:15  PM      Result Value Range   Yeast Wet Prep HPF POC NONE SEEN  NONE SEEN   Trich, Wet Prep NONE SEEN  NONE SEEN   Clue Cells Wet Prep HPF POC FEW (*) NONE SEEN   WBC, Wet Prep HPF POC MODERATE (*) NONE SEEN    MAU Course  Procedures  MDM  UA, UPT, Wet prep, GC/Chlamydia  Assessment and Plan   A: UTI  P: Cipro 500 mg x 5 days Increase fluids OTC Ibuprofen for discomfort  Carolynn Serve 08/26/2013, 4:47 PM

## 2013-08-27 NOTE — MAU Provider Note (Signed)
Attestation of Attending Supervision of Advanced Practitioner: Evaluation and management procedures were performed by the PA/NP/CNM/OB Fellow under my supervision/collaboration. Chart reviewed and agree with management and plan.  Sarya Linenberger V 08/27/2013 10:11 PM    

## 2013-08-28 LAB — URINE CULTURE

## 2013-08-29 LAB — GC/CHLAMYDIA PROBE AMP: GC Probe RNA: POSITIVE — AB

## 2013-08-31 ENCOUNTER — Ambulatory Visit (INDEPENDENT_AMBULATORY_CARE_PROVIDER_SITE_OTHER): Payer: Self-pay

## 2013-08-31 VITALS — BP 132/94 | HR 81 | Temp 97.5°F

## 2013-08-31 DIAGNOSIS — L509 Urticaria, unspecified: Secondary | ICD-10-CM

## 2013-08-31 DIAGNOSIS — A5400 Gonococcal infection of lower genitourinary tract, unspecified: Secondary | ICD-10-CM

## 2013-08-31 DIAGNOSIS — A549 Gonococcal infection, unspecified: Secondary | ICD-10-CM

## 2013-08-31 MED ORDER — AZITHROMYCIN 250 MG PO TABS
1000.0000 mg | ORAL_TABLET | Freq: Once | ORAL | Status: AC
  Administered 2013-08-31: 1000 mg via ORAL

## 2013-08-31 MED ORDER — LIDOCAINE HCL 1 % IJ SOLN
250.0000 mg | Freq: Once | INTRAMUSCULAR | Status: AC
  Administered 2013-08-31: 250 mg via INTRAMUSCULAR

## 2013-08-31 MED ORDER — DIPHENHYDRAMINE HCL 25 MG PO CAPS
25.0000 mg | ORAL_CAPSULE | Freq: Once | ORAL | Status: AC
  Administered 2013-08-31: 25 mg via ORAL

## 2013-08-31 NOTE — Progress Notes (Signed)
Pt. Here today to be treated for gonhorrhea. Informed pt. That with her PCN allergy there is a very small chance she will develop hives-- pt. Requested Benadryl and states she does not have any any home. Asked pt. If she had a ride home and pt. Stated her Baby's father is driving her and her partner and she will not be driving. Verified this with Rulon Abide and benadryl ordered.  Benadryl given. Zithromax 1gm given orally. Rocephin 250mg  with 1% lidocaine administered. Pt. Tolerated all well.

## 2013-09-15 ENCOUNTER — Encounter: Payer: Self-pay | Admitting: Obstetrics & Gynecology

## 2013-09-15 ENCOUNTER — Ambulatory Visit (INDEPENDENT_AMBULATORY_CARE_PROVIDER_SITE_OTHER): Payer: Medicaid Other | Admitting: Obstetrics & Gynecology

## 2013-09-15 VITALS — BP 113/77 | HR 81 | Temp 98.4°F | Ht 65.0 in | Wt 159.8 lb

## 2013-09-15 DIAGNOSIS — A64 Unspecified sexually transmitted disease: Secondary | ICD-10-CM

## 2013-09-15 NOTE — Patient Instructions (Signed)
Sexually Transmitted Disease °Sexually transmitted disease (STD) refers to any infection that is passed from person to person during sexual activity. This may happen by way of saliva, semen, blood, vaginal mucus, or urine. Common STDs include: °· Gonorrhea. °· Chlamydia. °· Syphilis. °· HIV/AIDS. °· Genital herpes. °· Hepatitis B and C. °· Trichomonas. °· Human papillomavirus (HPV). °· Pubic lice. °CAUSES  °An STD may be spread by bacteria, virus, or parasite. A person can get an STD by: °· Sexual intercourse with an infected person. °· Sharing sex toys with an infected person. °· Sharing needles with an infected person. °· Having intimate contact with the genitals, mouth, or rectal areas of an infected person. °SYMPTOMS  °Some people may not have any symptoms, but they can still pass the infection to others. Different STDs have different symptoms. Symptoms include: °· Painful or bloody urination. °· Pain in the pelvis, abdomen, vagina, anus, throat, or eyes. °· Skin rash, itching, irritation, growths, or sores (lesions). These usually occur in the genital or anal area. °· Abnormal vaginal discharge. °· Penile discharge in men. °· Soft, flesh-colored skin growths in the genital or anal area. °· Fever. °· Pain or bleeding during sexual intercourse. °· Swollen glands in the groin area. °· Yellow skin and eyes (jaundice). This is seen with hepatitis. °DIAGNOSIS  °To make a diagnosis, your caregiver may: °· Take a medical history. °· Perform a physical exam. °· Take a specimen (culture) to be examined. °· Examine a sample of discharge under a microscope. °· Perform blood tests. °· Perform a Pap test, if this applies. °· Perform a colposcopy. °· Perform a laparoscopy. °TREATMENT  °· Chlamydia, gonorrhea, trichomonas, and syphilis can be cured with antibiotic medicine. °· Genital herpes, hepatitis, and HIV can be treated, but not cured, with prescribed medicines. The medicines will lessen the symptoms. °· Genital warts  from HPV can be treated with medicine or by freezing, burning (electrocautery), or surgery. Warts may come back. °· HPV is a virus and cannot be cured with medicine or surgery. However, abnormal areas may be followed very closely by your caregiver and may be removed from the cervix, vagina, or vulva through office procedures or surgery. °If your diagnosis is confirmed, your recent sexual partners need treatment. This is true even if they are symptom-free or have a negative culture or evaluation. They should not have sex until their caregiver says it is okay. °HOME CARE INSTRUCTIONS °· All sexual partners should be informed, tested, and treated for all STDs. °· Take your antibiotics as directed. Finish them even if you start to feel better. °· Only take over-the-counter or prescription medicines for pain, discomfort, or fever as directed by your caregiver. °· Rest. °· Eat a balanced diet and drink enough fluids to keep your urine clear or pale yellow. °· Do not have sex until treatment is completed and you have followed up with your caregiver. STDs should be checked after treatment. °· Keep all follow-up appointments, Pap tests, and blood tests as directed by your caregiver. °· Only use latex condoms and water-soluble lubricants during sexual activity. Do not use petroleum jelly or oils. °· Avoid alcohol and illegal drugs. °· Get vaccinated for HPV and hepatitis. If you have not received these vaccines in the past, talk to your caregiver about whether one or both might be right for you. °· Avoid risky sex practices that can break the skin. °The only way to avoid getting an STD is to avoid all sexual activity. Latex condoms and dental   dams (for oral sex) will help lessen the risk of getting an STD, but will not completely eliminate the risk. °SEEK MEDICAL CARE IF:  °· You have a fever. °· You have any new or worsening symptoms. °Document Released: 12/06/2002 Document Revised: 12/08/2011 Document Reviewed:  04/05/2013 °[employer]® Patient Information ©2014 [employer]. ° °

## 2013-09-15 NOTE — Addendum Note (Signed)
Addended by: Franchot Mimes on: 09/15/2013 02:34 PM   Modules accepted: Orders

## 2013-09-15 NOTE — Progress Notes (Signed)
Subjective:     Patient ID: Janice Duarte, female   DOB: 23-Mar-1991, 22 y.o.   MRN: [id number]  HPI Pt presents for f/u STI cx.  She was prev treated for GC.  She reports that she is in a same-sex relationship but, she 'lost her virginity' with a guy she'd known for while.  She reports that she thinks she needs an infertility workup but, has only had intercourse with a female 2x in her life.  She denies using other methods to conceive.  She has never had a PAP (she mentioned this after her exam).  Review of Systems     Objective:   Physical Exam BP 113/77  Pulse 81  Temp(Src) 98.4 F (36.9 C) (Oral)  Ht 5\' 5"  (1.651 m)  Wt 159 lb 12.8 oz (72.485 kg)  BMI 26.59 kg/m2  LMP 09/08/2013 Pt in NAD GU: EGBUS: no lesions Vagina: no blood in vault Cervix: no lesion; no mucopurulent d/c; no CMT (with q-tip)          Assessment:     STI check     Plan:     F/u cx results F/u for an Annual exam

## 2013-09-16 LAB — GC/CHLAMYDIA PROBE AMP: GC Probe RNA: NEGATIVE

## 2013-09-23 ENCOUNTER — Emergency Department (HOSPITAL_COMMUNITY)
Admission: EM | Admit: 2013-09-23 | Discharge: 2013-09-23 | Disposition: A | Discharge status: Home / Self Care | Payer: Medicaid Other | Attending: Emergency Medicine | Admitting: Emergency Medicine

## 2013-09-23 ENCOUNTER — Encounter (HOSPITAL_COMMUNITY): Payer: Self-pay | Admitting: Emergency Medicine

## 2013-09-23 DIAGNOSIS — R109 Unspecified abdominal pain: Secondary | ICD-10-CM | POA: Insufficient documentation

## 2013-09-23 DIAGNOSIS — M549 Dorsalgia, unspecified: Secondary | ICD-10-CM

## 2013-09-23 DIAGNOSIS — Z872 Personal history of diseases of the skin and subcutaneous tissue: Secondary | ICD-10-CM | POA: Insufficient documentation

## 2013-09-23 DIAGNOSIS — Z8742 Personal history of other diseases of the female genital tract: Secondary | ICD-10-CM | POA: Insufficient documentation

## 2013-09-23 DIAGNOSIS — Z8669 Personal history of other diseases of the nervous system and sense organs: Secondary | ICD-10-CM | POA: Insufficient documentation

## 2013-09-23 DIAGNOSIS — Z880 Allergy status to penicillin: Secondary | ICD-10-CM | POA: Insufficient documentation

## 2013-09-23 DIAGNOSIS — Z3202 Encounter for pregnancy test, result negative: Secondary | ICD-10-CM | POA: Insufficient documentation

## 2013-09-23 LAB — URINALYSIS, ROUTINE W REFLEX MICROSCOPIC
Glucose, UA: NEGATIVE mg/dL
Hgb urine dipstick: NEGATIVE
Ketones, ur: NEGATIVE mg/dL
Leukocytes, UA: NEGATIVE
Nitrite: NEGATIVE
Protein, ur: NEGATIVE mg/dL
Urobilinogen, UA: 0.2 mg/dL (ref 0.0–1.0)

## 2013-09-23 LAB — POCT PREGNANCY, URINE: Preg Test, Ur: NEGATIVE

## 2013-09-23 MED ORDER — IBUPROFEN 800 MG PO TABS: 800.0000 mg | ORAL_TABLET | Freq: Three times a day (TID) | ORAL | Status: DC

## 2013-09-23 MED ORDER — TRAMADOL HCL 50 MG PO TABS
50.0000 mg | ORAL_TABLET | Freq: Four times a day (QID) | ORAL | Status: DC | PRN
  Administered 2013-09-23: 50 mg via ORAL
  Filled 2013-09-23: qty 1

## 2013-09-23 MED ORDER — TRAMADOL HCL 50 MG PO TABS: 50.0000 mg | ORAL_TABLET | Freq: Four times a day (QID) | ORAL | Status: DC | PRN

## 2013-09-23 MED ORDER — IBUPROFEN 800 MG PO TABS
800.0000 mg | ORAL_TABLET | Freq: Once | ORAL | Status: AC
  Administered 2013-09-23: 800 mg via ORAL
  Filled 2013-09-23: qty 1

## 2013-09-23 NOTE — ED Notes (Signed)
Pt c/o UTI. Pt states she has been dealing with it for a month. Pt reports pain to her left flank. Pt denies discharge, dysuria, and hematuria. Pt denies chills, fevers, diarrhea.

## 2013-09-23 NOTE — ED Provider Notes (Signed)
CSN: [id number]     Arrival date & time 09/23/13  0349 History   First MD Initiated Contact with Patient 09/23/13 [phone]     Chief Complaint  Patient presents with  . Urinary Tract Infection   (Consider location/radiation/quality/duration/timing/severity/associated sxs/prior Treatment) HPI 22 year old female presents to the emergency department with complaint of left flank.  Pain.  Patient reports that she has been dealing with urinary tract infection for the last month, and recently finished up antibiotics for same.  Tonight she woke with left flank pain.  She attributed it to return of her UTI.  She denies any frequency, burning with urination, vaginal discharge.  No new sexual partners.  No fevers no chills.  No nausea no vomiting.  No prior history of kidney stones.  Prior records reviewed.  Patient is already treated for STI with Cipro and Diflucan [facility]  Patient denies any trauma to her back.  Pain is worse with movement and palpation. Past Medical History  Diagnosis Date  . Scoliosis   . Bacterial vaginal infection   . Headaches, cluster   . Abscess    Past Surgical History  Procedure Laterality Date  . Wisdom tooth extraction     History reviewed. No pertinent family history. History  Substance Use Topics  . Smoking status: Never Smoker   . Smokeless tobacco: Not on file  . Alcohol Use: No   OB History   Grav Para Term Preterm Abortions TAB SAB Ect Mult Living                 Review of Systems  See History of Present Illness; otherwise all other systems are reviewed and negative  Allergies  Bactrim and Penicillins  Home Medications   Current Outpatient Rx  Name  Route  Sig  Dispense  Refill  . Aspirin-Salicylamide-Caffeine (BC HEADACHE POWDER PO)   Oral   Take 1 Package by mouth daily as needed (pain).         . cyclobenzaprine (FLEXERIL) 10 MG tablet   Oral   Take 1 tablet (10 mg total) by mouth 2 (two) times daily as  needed for muscle spasms.   20 tablet   0    BP 118/73  Pulse 82  Temp(Src) 98.1 F (36.7 C) (Oral)  Resp 16  SpO2 100%  LMP 09/08/2013 Physical Exam  Nursing note and vitals reviewed. Constitutional: She is oriented to person, place, and time. She appears well-developed and well-nourished.  HENT:  Head: Normocephalic and atraumatic.  Nose: Nose normal.  Mouth/Throat: Oropharynx is clear and moist.  Eyes: Conjunctivae and EOM are normal. Pupils are equal, round, and reactive to light.  Neck: Normal range of motion. Neck supple. No JVD present. No tracheal deviation present. No thyromegaly present.  Cardiovascular: Normal rate, regular rhythm, normal heart sounds and intact distal pulses.  Exam reveals no gallop and no friction rub.   No murmur heard. Pulmonary/Chest: Effort normal and breath sounds normal. No stridor. No respiratory distress. She has no wheezes. She has no rales. She exhibits no tenderness.  Abdominal: Soft. Bowel sounds are normal. She exhibits no distension and no mass. There is no tenderness. There is no rebound and no guarding.  Musculoskeletal: Normal range of motion. She exhibits tenderness (she has tenderness to left paraspinal muscles with spasm noted). She exhibits no edema.  Lymphadenopathy:    She has no cervical adenopathy.  Neurological: She is alert and oriented to person, place, and time. She has  normal reflexes. She exhibits normal muscle tone. Coordination normal.  Skin: Skin is warm and dry. No rash noted. No erythema. No pallor.  Psychiatric: She has a normal mood and affect. Her behavior is normal. Judgment and thought content normal.    ED Course  Procedures (including critical care time) Labs Review Labs Reviewed  URINALYSIS, ROUTINE W REFLEX MICROSCOPIC - Abnormal; Notable for the following:    APPearance CLOUDY (*)    All other components within normal limits   Imaging Review No results found.  EKG Interpretation   None        MDM   1. Back pain    22 year old female with normal urinalysis, left paraspinal muscle tenderness, consistent with muscle spasm.  Will treat with ibuprofen and Ultram.  Patient has been instructed to do gentle stretching exercises and use warm moist heat.   Olivia Mackie, MD 09/23/13 [phone]

## 2013-11-16 ENCOUNTER — Emergency Department (HOSPITAL_COMMUNITY)
Admission: EM | Admit: 2013-11-16 | Discharge: 2013-11-16 | Discharge status: Against Medical Advice | Payer: Medicaid Other | Attending: Emergency Medicine | Admitting: Emergency Medicine

## 2013-11-16 ENCOUNTER — Encounter (HOSPITAL_COMMUNITY): Payer: Self-pay | Admitting: Emergency Medicine

## 2013-11-16 DIAGNOSIS — H53149 Visual discomfort, unspecified: Secondary | ICD-10-CM | POA: Insufficient documentation

## 2013-11-16 DIAGNOSIS — F17200 Nicotine dependence, unspecified, uncomplicated: Secondary | ICD-10-CM | POA: Insufficient documentation

## 2013-11-16 DIAGNOSIS — R51 Headache: Secondary | ICD-10-CM | POA: Insufficient documentation

## 2013-11-16 NOTE — ED Notes (Signed)
Pt requesting to leave; RN encouraged her to stay. Pt reports vision has gotten better. Pt reports she has to get up early and is leaving.

## 2013-11-16 NOTE — ED Notes (Signed)
Patient presents with c/o headache for 2 days.  Tonight noticed a lump that came up to the back of her head.  Also c/o photophobia more in the right eye

## 2013-11-17 ENCOUNTER — Emergency Department (HOSPITAL_COMMUNITY): Payer: Medicaid Other

## 2013-11-17 ENCOUNTER — Encounter (HOSPITAL_COMMUNITY): Payer: Self-pay | Admitting: Emergency Medicine

## 2013-11-17 ENCOUNTER — Emergency Department (HOSPITAL_COMMUNITY)
Admission: EM | Admit: 2013-11-17 | Discharge: 2013-11-17 | Disposition: A | Discharge status: Home / Self Care | Payer: Medicaid Other | Attending: Emergency Medicine | Admitting: Emergency Medicine

## 2013-11-17 DIAGNOSIS — F17200 Nicotine dependence, unspecified, uncomplicated: Secondary | ICD-10-CM | POA: Insufficient documentation

## 2013-11-17 DIAGNOSIS — Z8739 Personal history of other diseases of the musculoskeletal system and connective tissue: Secondary | ICD-10-CM | POA: Insufficient documentation

## 2013-11-17 DIAGNOSIS — Z8619 Personal history of other infectious and parasitic diseases: Secondary | ICD-10-CM | POA: Insufficient documentation

## 2013-11-17 DIAGNOSIS — Z872 Personal history of diseases of the skin and subcutaneous tissue: Secondary | ICD-10-CM | POA: Insufficient documentation

## 2013-11-17 DIAGNOSIS — Z8669 Personal history of other diseases of the nervous system and sense organs: Secondary | ICD-10-CM | POA: Insufficient documentation

## 2013-11-17 DIAGNOSIS — R220 Localized swelling, mass and lump, head: Secondary | ICD-10-CM | POA: Insufficient documentation

## 2013-11-17 DIAGNOSIS — Z8742 Personal history of other diseases of the female genital tract: Secondary | ICD-10-CM | POA: Insufficient documentation

## 2013-11-17 DIAGNOSIS — Z3202 Encounter for pregnancy test, result negative: Secondary | ICD-10-CM | POA: Insufficient documentation

## 2013-11-17 DIAGNOSIS — R221 Localized swelling, mass and lump, neck: Secondary | ICD-10-CM

## 2013-11-17 DIAGNOSIS — R42 Dizziness and giddiness: Secondary | ICD-10-CM | POA: Insufficient documentation

## 2013-11-17 DIAGNOSIS — G43909 Migraine, unspecified, not intractable, without status migrainosus: Secondary | ICD-10-CM

## 2013-11-17 DIAGNOSIS — Z880 Allergy status to penicillin: Secondary | ICD-10-CM | POA: Insufficient documentation

## 2013-11-17 LAB — URINALYSIS, ROUTINE W REFLEX MICROSCOPIC
BILIRUBIN URINE: NEGATIVE
GLUCOSE, UA: NEGATIVE mg/dL
HGB URINE DIPSTICK: NEGATIVE
Ketones, ur: NEGATIVE mg/dL
Leukocytes, UA: NEGATIVE
Nitrite: NEGATIVE
PH: 6 (ref 5.0–8.0)
Protein, ur: NEGATIVE mg/dL
SPECIFIC GRAVITY, URINE: 1.005 (ref 1.005–1.030)
Urobilinogen, UA: 0.2 mg/dL (ref 0.0–1.0)

## 2013-11-17 LAB — CBC
HEMATOCRIT: 35.8 % — AB (ref 36.0–46.0)
Hemoglobin: 12.4 g/dL (ref 12.0–15.0)
MCH: 25.3 pg — ABNORMAL LOW (ref 26.0–34.0)
MCHC: 34.6 g/dL (ref 30.0–36.0)
MCV: 73.1 fL — AB (ref 78.0–100.0)
Platelets: 335 10*3/uL (ref 150–400)
RBC: 4.9 MIL/uL (ref 3.87–5.11)
RDW: 14.5 % (ref 11.5–15.5)
WBC: 8.8 10*3/uL (ref 4.0–10.5)

## 2013-11-17 LAB — BASIC METABOLIC PANEL
BUN: 7 mg/dL (ref 6–23)
CHLORIDE: 98 meq/L (ref 96–112)
CO2: 27 meq/L (ref 19–32)
CREATININE: 0.79 mg/dL (ref 0.50–1.10)
Calcium: 9 mg/dL (ref 8.4–10.5)
GFR calc Af Amer: >90 mL/min (ref >90)
GFR calc non Af Amer: >90 mL/min (ref >90)
GLUCOSE: 96 mg/dL (ref 70–99)
Potassium: 4.3 mEq/L (ref 3.7–5.3)
Sodium: 136 mEq/L — ABNORMAL LOW (ref 137–147)

## 2013-11-17 LAB — PREGNANCY, URINE: Preg Test, Ur: NEGATIVE

## 2013-11-17 MED ORDER — SODIUM CHLORIDE 0.9 % IV BOLUS (SEPSIS)
1000.0000 mL | Freq: Once | INTRAVENOUS | Status: AC
  Administered 2013-11-17: 1000 mL via INTRAVENOUS

## 2013-11-17 MED ORDER — SODIUM CHLORIDE 0.9 % IV BOLUS (SEPSIS): 1000.0000 mL | Freq: Once | INTRAVENOUS | Status: DC

## 2013-11-17 MED ORDER — KETOROLAC TROMETHAMINE 30 MG/ML IJ SOLN
30.0000 mg | Freq: Once | INTRAMUSCULAR | Status: AC
  Administered 2013-11-17: 30 mg via INTRAVENOUS
  Filled 2013-11-17: qty 1

## 2013-11-17 MED ORDER — METOCLOPRAMIDE HCL 5 MG/ML IJ SOLN
10.0000 mg | Freq: Once | INTRAMUSCULAR | Status: AC
  Administered 2013-11-17: 10 mg via INTRAVENOUS
  Filled 2013-11-17: qty 2

## 2013-11-17 NOTE — ED Notes (Signed)
Pt to nurses station. States she feels better. Would like discharge papers.

## 2013-11-17 NOTE — ED Notes (Signed)
Pt c/o HA since 8pm yesterday, with a "knot" in the right occipital region. Pt c/o blurred vision, photophobia, and dizziness. Pt came to er last pm, but states left due to wait time. Pt is alert x4 MAE randomly.

## 2013-11-17 NOTE — ED Provider Notes (Signed)
CSN: [id number]     Arrival date & time 11/17/13  0636 History   First MD Initiated Contact with Patient 11/17/13 [phone]     Chief Complaint  Patient presents with  . Headache     (Consider location/radiation/quality/duration/timing/severity/associated sxs/prior Treatment) The history is provided by the patient. No language interpreter was used.  Janice Duarte is a 23 y/o F with PMHx of scoliosis, bacterial vaginosis, cluster migraines presenting to the ED with headache that has been ongoing for the past 2 days. Patient reported that the headache is localized to the right side of her head described as an aching sensation that is constant. Reported that she has been having photophobia and intermittent blurred vision to the right eye. Patient reported that the other day she noticed a "knot" to the occipital region. Patient reported that she has been using Advil and [facility] Goody Powder with minimal relief. Reported that she has been feeling nauseous and intermittent dizziness. Reported that she normally gets migraines, but this affects the entire head - both sides. Reported that she was at the hospital in the waiting room last night, but stated that she left due to the wait time being too long. denied vomiting, diarrhea, abdominal pain, neck pain and neck stiffness, chest pain, shortness of breath, difficulty breathing, changes appetite, bowel movement change, urinary issues, numbness, tingling, to become change speech pattern, sudden onset, worst headache of life, sudden loss of vision to one eye. PCP none  Past Medical History  Diagnosis Date  . Scoliosis   . Bacterial vaginal infection   . Headaches, cluster   . Abscess    Past Surgical History  Procedure Laterality Date  . Wisdom tooth extraction     History reviewed. No pertinent family history. History  Substance Use Topics  . Smoking status: Current Every Day Smoker  . Smokeless tobacco: Never Used  . Alcohol Use: No   OB History    Grav Para Term Preterm Abortions TAB SAB Ect Mult Living                 Review of Systems  Constitutional: Negative for fever and chills.  HENT: Negative for trouble swallowing.   Eyes: Positive for photophobia and visual disturbance.  Respiratory: Negative for chest tightness and shortness of breath.   Cardiovascular: Negative for chest pain.  Gastrointestinal: Positive for nausea. Negative for vomiting, abdominal pain and diarrhea.  Musculoskeletal: Negative for back pain.  Neurological: Positive for dizziness and headaches. Negative for weakness.  All other systems reviewed and are negative.      Allergies  Bactrim and Penicillins  Home Medications   Current Outpatient Rx  Name  Route  Sig  Dispense  Refill  . Aspirin-Salicylamide-Caffeine (BC HEADACHE POWDER PO)   Oral   Take 1 packet by mouth daily as needed (for headache).         Marland Kitchen ibuprofen (ADVIL,MOTRIN) 200 MG tablet   Oral   Take 800 mg by mouth every 8 (eight) hours as needed for headache or mild pain.          BP 102/70  Pulse 66  Temp(Src) 97.7 F (36.5 C) (Oral)  Resp 16  Ht 5\' 5"  (1.651 m)  Wt 159 lb (72.122 kg)  BMI 26.46 kg/m2  SpO2 99%  LMP 11/08/2013 Physical Exam  Nursing note and vitals reviewed. Constitutional: She is oriented to person, place, and time. She appears well-developed and well-nourished. No distress.  Patient laying in bed watching television  HENT:  Head: Normocephalic and atraumatic.  Mouth/Throat: Oropharynx is clear and moist. No oropharyngeal exudate.  Discomfort upon palpation to the right eye, right maxillary and frontal aspect  Negative trismsus  Small 1 cm x 1 cm knot noted to the right occipital region - soft and tender upon palpation. Negative findings of trauma. Negative findings consistent with a cellulitic origin.   Eyes: Conjunctivae and EOM are normal. Pupils are equal, round, and reactive to light. Right eye exhibits no discharge. Left eye exhibits no  discharge.  Negative nystagmus Visual fields grossly intact   Neck: Normal range of motion. Neck supple. No tracheal deviation present.  Negative neck stiffness Negative nuchal rigidity Negative cervical lymphadenopathy  Cardiovascular: Normal rate, regular rhythm and normal heart sounds.  Exam reveals no friction rub.   No murmur heard. Pulses:      Radial pulses are 2+ on the right side, and 2+ on the left side.       Dorsalis pedis pulses are 2+ on the right side, and 2+ on the left side.  Pulmonary/Chest: Effort normal and breath sounds normal. No respiratory distress. She has no wheezes. She has no rales.  Musculoskeletal: Normal range of motion.  Full ROM to upper and lower extremities without difficulty noted, negative ataxia noted.  Lymphadenopathy:    She has no cervical adenopathy.  Neurological: She is alert and oriented to person, place, and time. No cranial nerve deficit. She exhibits normal muscle tone. Coordination normal.  Cranial nerves III-XII grossly intact Strength 5+/5+ to upper and lower extremities bilaterally with resistance applied, equal distribution noted Equal grip strength Sensation intact  Negative arm drift Fine motor skills intact Patient able to bring finger to nose without difficulty  Negative facial drooping Negative slurred speech  Gait proper - proper balance - negative sway   Skin: Skin is warm and dry. No rash noted. She is not diaphoretic. No erythema.  Psychiatric: She has a normal mood and affect. Her behavior is normal. Thought content normal.    ED Course  Procedures (including critical care time)  8:57 AM This provider re-assessed the patient. Patient reported that she is feeling better, reported that she is ready to go. Reported that her headache has been relieved with medication.   Results for orders placed during the hospital encounter of 11/17/13  CBC      Result Value Ref Range   WBC 8.8  4.0 - 10.5 K/uL   RBC 4.90  3.87 -  5.11 MIL/uL   Hemoglobin 12.4  12.0 - 15.0 g/dL   HCT 91.4 (*) 78.2 - 95.6 %   MCV 73.1 (*) 78.0 - 100.0 fL   MCH 25.3 (*) 26.0 - 34.0 pg   MCHC 34.6  30.0 - 36.0 g/dL   RDW 21.3  08.6 - 57.8 %   Platelets 335  150 - 400 K/uL  BASIC METABOLIC PANEL      Result Value Ref Range   Sodium 136 (*) 137 - 147 mEq/L   Potassium 4.3  3.7 - 5.3 mEq/L   Chloride 98  96 - 112 mEq/L   CO2 27  19 - 32 mEq/L   Glucose, Bld 96  70 - 99 mg/dL   BUN 7  6 - 23 mg/dL   Creatinine, Ser 4.69  0.50 - 1.10 mg/dL   Calcium 9.0  8.4 - 62.9 mg/dL   GFR calc non Af Amer >90  >90 mL/min   GFR calc Af Amer >90  >90 mL/min  PREGNANCY, URINE      Result Value Ref Range   Preg Test, Ur NEGATIVE  NEGATIVE  URINALYSIS, ROUTINE W REFLEX MICROSCOPIC      Result Value Ref Range   Color, Urine YELLOW  YELLOW   APPearance CLEAR  CLEAR   Specific Gravity, Urine 1.005  1.005 - 1.030   pH 6.0  5.0 - 8.0   Glucose, UA NEGATIVE  NEGATIVE mg/dL   Hgb urine dipstick NEGATIVE  NEGATIVE   Bilirubin Urine NEGATIVE  NEGATIVE   Ketones, ur NEGATIVE  NEGATIVE mg/dL   Protein, ur NEGATIVE  NEGATIVE mg/dL   Urobilinogen, UA 0.2  0.0 - 1.0 mg/dL   Nitrite NEGATIVE  NEGATIVE   Leukocytes, UA NEGATIVE  NEGATIVE   Ct Head Wo Contrast  11/17/2013   CLINICAL DATA:  Headache with nausea and photophobia  EXAM: CT HEAD WITHOUT CONTRAST  TECHNIQUE: Contiguous axial images were obtained from the base of the skull through the vertex without intravenous contrast. Study was obtained within 24 hr of patient's arrival at the emergency department.  COMPARISON:  None.  FINDINGS: Ventricles are normal in size and configuration. There is no mass, hemorrhage, extra-axial fluid collection, or midline shift. Gray-white compartments are normal. No demonstrable acute infarct. Bony calvarium appears intact. The mastoid air cells are clear.  IMPRESSION: Study within normal limits.   Electronically Signed   By: Bretta Bang M.D.   On: 11/17/2013 08:48    Labs Review Labs Reviewed  CBC - Abnormal; Notable for the following:    HCT 35.8 (*)    MCV 73.1 (*)    MCH 25.3 (*)    All other components within normal limits  BASIC METABOLIC PANEL - Abnormal; Notable for the following:    Sodium 136 (*)    All other components within normal limits  PREGNANCY, URINE  URINALYSIS, ROUTINE W REFLEX MICROSCOPIC   Imaging Review Ct Head Wo Contrast  11/17/2013   CLINICAL DATA:  Headache with nausea and photophobia  EXAM: CT HEAD WITHOUT CONTRAST  TECHNIQUE: Contiguous axial images were obtained from the base of the skull through the vertex without intravenous contrast. Study was obtained within 24 hr of patient's arrival at the emergency department.  COMPARISON:  None.  FINDINGS: Ventricles are normal in size and configuration. There is no mass, hemorrhage, extra-axial fluid collection, or midline shift. Gray-white compartments are normal. No demonstrable acute infarct. Bony calvarium appears intact. The mastoid air cells are clear.  IMPRESSION: Study within normal limits.   Electronically Signed   By: Bretta Bang M.D.   On: 11/17/2013 08:48    EKG Interpretation   None       MDM   Final diagnoses:  Migraine   Medications  sodium chloride 0.9 % bolus 1,000 mL (not administered)  sodium chloride 0.9 % bolus 1,000 mL (1,000 mLs Intravenous New Bag/Given 11/17/13 0724)  ketorolac (TORADOL) 30 MG/ML injection 30 mg (30 mg Intravenous Given 11/17/13 0808)  metoCLOPramide (REGLAN) injection 10 mg (10 mg Intravenous Given 11/17/13 0808)   Filed Vitals:   11/17/13 0857 11/17/13 0859 11/17/13 0901 11/17/13 0902  BP: 109/62 99/63 99/63  102/70  Pulse: 69 69 88 66  Temp:    97.7 F (36.5 C)  TempSrc:    Oral  Resp:    16  Height:      Weight:      SpO2:    99%     Patient presenting to the ED with headache, nausea, mild blurred vision  and photophobia to the right eye intermittently that has been ongoing for the past 2 days. Patient  reported that she normally gets headaches/migraines that mainly effects both sides of the head. Patient reported that she has been feeling nauseous and has been having dizziness - denied vomiting, head injury. Alert and oriented. GCS 15. Heart rate and rhythm normal. Lungs clear to auscultation. Radial and DP pulses 2+ bilaterally. Cranial nerves grossly intact. Visual fields grossly intact, negative nystagmus. Negative pain upon palpation to the neck-negative cervical lymphadenopathy. Full range of motion to upper and lower extremities bilaterally without difficulty or ataxia noted. Strength intact to upper and lower extremities with resistance applied, equal distribution. Negative facial droop. Negative slurred speech. Patient is able to bring finger to nose without difficulty or ataxia. Gait proper-proper balance-negative step offs, negative sway. Visual acuity negative findings noted.  CBC negative findings. BMP negative findings. UA negative for infection or leukocytosis. Negative urine pregnancy. CT head without contrast negative acute intracranial abnormalities noted.  Doubt meningitis. Doubt ICH. Doubt SAH. Doubt temporal arteritis. Doubt septic nature. Suspicion to be migraine, possibly cluster. Patent's blood pressure was 99/63, patient was sleeping and just woke up - patient given food and juices, repeat BP after food and fluids was 102/70. Discussed case with attending physician who agreed to plan of discharge. Patient stable, afebrile. Discharged patient. Discussed with patient to rest and stay hydrated. Discussed with patient to use Excedrin migraines relief. Referred to neurology and Urgent care. Discussed with patient to closely monitor symptoms and if symptoms are to worsen or change to report back to the ED - strict return instructions given.  Patient agreed to plan of care, understood, all questions answered.    Raymon MuttonMarissa Zaeda Mcferran, PA-C 11/17/13 1709

## 2013-11-17 NOTE — ED Notes (Signed)
Pt given sandwich ginger ale and peanut butter crackers.

## 2013-11-17 NOTE — Discharge Instructions (Signed)
Please call and set-up an appointment with Urgent Care to be re-assessed Please call and set-up an appointment with neurology is headaches continue or the pattern changes Please rest and stay hydrated Can use Excedrin migraine relief for headache Please rest and stay hydrated Please continue to monitor symptoms closely and if symptoms are to worsen or change (fever greater than 101, chills, sweats, neck pain, neck stiffness, chest pain, shortness of breath, difficulty breathing, facial drooping, slurred speech, difficulty swallowing, numbness, tingling, weakness, worsening or changes to headache, worsening or changes to vision) please report back to emergency department immediately   Cluster Headache Cluster headaches are deeply painful. They normally occur on one side of your head, but they may switch sides. Cluster headaches:  Are severe.  Happen often for a few weeks or months and then go away for a while.  Last from 15 minutes to 3 hours.  Happen at the same time each day.  Often happen at night.  Happen many times a day. HOME CARE  During times when you have cluster headaches:  Get the same amount of sleep every night, at the same time each night.  Avoid alcohol.  Stop smoking if you smoke. GET HELP IF:  There are changes in how bad or how often your headaches happen.  Your medicines are not helping. GET HELP RIGHT AWAY IF:  You pass out (faint).  You become weak or lose feeling (have numbness) on one side of your body or face.  You see two of everything (double vision).  You feel sick to your stomach (nauseous) or throw up (vomit) and do not stop after several hours.  You are off balance or have trouble talking or walking.  You have neck pain or stiffness.  You have a fever. MAKE SURE YOU:  Understand these instructions.  Will watch your condition.  Will get help right away if you are not doing well or get worse. Document Released: 10/23/2004 Document  Revised: 05/18/2013 Document Reviewed: 04/07/2013 [facility] Patient Information 2014 [place], [place].  Migraine Headache A migraine headache is an intense, throbbing pain on one or both sides of your head. A migraine can last for 30 minutes to several hours. CAUSES  The exact cause of a migraine headache is not always known. However, a migraine may be caused when nerves in the brain become irritated and release chemicals that cause inflammation. This causes pain. Certain things may also trigger migraines, such as:  Alcohol.  Smoking.  Stress.  Menstruation.  Aged cheeses.  Foods or drinks that contain nitrates, glutamate, aspartame, or tyramine.  Lack of sleep.  Chocolate.  Caffeine.  Hunger.  Physical exertion.  Fatigue.  Medicines used to treat chest pain (nitroglycerine), birth control pills, estrogen, and some blood pressure medicines. SIGNS AND SYMPTOMS  Pain on one or both sides of your head.  Pulsating or throbbing pain.  Severe pain that prevents daily activities.  Pain that is aggravated by any physical activity.  Nausea, vomiting, or both.  Dizziness.  Pain with exposure to bright lights, loud noises, or activity.  General sensitivity to bright lights, loud noises, or smells. Before you get a migraine, you may get warning signs that a migraine is coming (aura). An aura may include:  Seeing flashing lights.  Seeing bright spots, halos, or zig-zag lines.  Having tunnel vision or blurred vision.  Having feelings of numbness or tingling.  Having trouble talking.  Having muscle weakness. DIAGNOSIS  A migraine headache is often diagnosed based on:  Symptoms.  Physical exam.  A CT scan or MRI of your head. These imaging tests cannot diagnose migraines, but they can help rule out other causes of headaches. TREATMENT Medicines may be given for pain and nausea. Medicines can also be given to help prevent recurrent migraines.  HOME CARE  INSTRUCTIONS  Only take over-the-counter or prescription medicines for pain or discomfort as directed by your health care provider. The use of long-term narcotics is not recommended.  Lie down in a dark, quiet room when you have a migraine.  Keep a journal to find out what may trigger your migraine headaches. For example, write down:  What you eat and drink.  How much sleep you get.  Any change to your diet or medicines.  Limit alcohol consumption.  Quit smoking if you smoke.  Get 7 9 hours of sleep, or as recommended by your health care provider.  Limit stress.  Keep lights dim if bright lights bother you and make your migraines worse. SEEK IMMEDIATE MEDICAL CARE IF:   Your migraine becomes severe.  You have a fever.  You have a stiff neck.  You have vision loss.  You have muscular weakness or loss of muscle control.  You start losing your balance or have trouble walking.  You feel faint or pass out.  You have severe symptoms that are different from your first symptoms. MAKE SURE YOU:   Understand these instructions.  Will watch your condition.  Will get help right away if you are not doing well or get worse. Document Released: 09/15/2005 Document Revised: 07/06/2013 Document Reviewed: 05/23/2013 [facility] Patient Information 2014 [place].   Emergency Department Resource Guide 1) Find a Doctor and Pay Out of Pocket Although you won't have to find out who is covered by your insurance plan, it is a good idea to ask around and get recommendations. You will then need to call the office and see if the doctor you have chosen will accept you as a new patient and what types of options they offer for patients who are self-pay. Some doctors offer discounts or will set up payment plans for their patients who do not have insurance, but you will need to ask so you aren't surprised when you get to your appointment.  2) Contact Your Local Health Department Not all  health departments have doctors that can see patients for sick visits, but many do, so it is worth a call to see if yours does. If you don't know where your local health department is, you can check in your phone book. The CDC also has a tool to help you locate your state's health department, and many state websites also have listings of all of their local health departments.  3) Find a Walk-in Clinic If your illness is not likely to be very severe or complicated, you may want to try a walk in clinic. These are popping up all over the country in pharmacies, drugstores, and shopping centers. They're usually staffed by nurse practitioners or physician assistants that have been trained to treat common illnesses and complaints. They're usually fairly quick and inexpensive. However, if you have serious medical issues or chronic medical problems, these are probably not your best option.  No Primary Care Doctor: - Call Health Connect at  [phone] - they can help you locate a primary care doctor that  accepts your insurance, provides certain services, etc. - Physician Referral Service- [phone]  Chronic Pain Problems: Organization  Address  Phone   Notes  [facility]  [phone] Patients need to be referred by their primary care doctor.   Medication Assistance: Organization         Address  Phone   Notes  [facility] Medication [facility] [address] [place], [place] [address] [phone] --Must be a resident of [facility] -- Must have NO insurance coverage whatsoever (no Medicaid/ Medicare, etc.) -- The pt. MUST have a primary care doctor that directs their care regularly and follows them in the community   MedAssist  [phone]   [employer]  [phone]    Agencies that provide inexpensive medical care: Organization         Address  Phone   Notes  [place]  [phone]   [facility]     [phone]   [facility] [place], [place] [address] [phone]   [place] [address] [place] [phone]   Planned Parenthood    [phone]   [facility]    [phone]   [place] and [place] [address], [place] Phone:  [phone], Fax:  [phone] Hours of Operation:  9 am - 6 pm, M-F.  Also accepts Medicaid/Medicare and self-pay.  [facility] for [address], [address], [place] Phone: [phone], Fax: [phone]. Hours of Operation:  8:30 am - 5:30 pm, M-F.  Also accepts Medicaid and self-pay.  [facility] [place] [address] [place] Phone: [phone]   [place], [place], [place] [phone], Ext. [phone] Mondays & Thursdays: 7-9 AM.  First 15 patients are seen on a first come, first serve basis.    [place] Providers:  Organization         Address  Phone   Notes  [facility] [address], [place] [phone] Also accepts self-pay patients.  [facility] [address], [place]  [phone]   [place], [address], [place] [phone]   [facility] [address], [place] [phone]   Lucianne Lei [address], [place]   [phone] Only accepts [place] Access [place] patients after they have their name applied to their card.   Self-Pay (no insurance) in [facility]:  Organization         Address  Phone   Notes  Sickle Cell Patients, [address] [facility] Internal Medicine [place] [phone]   [facility] Urgent Care [place] [phone]   [facility] [place]  [place], [place], [facility] [phone]     [facility]/Dr. Osei-Bonsu  [address] [place] or [address], [place] [phone] Phone number for both [place] and [place] locations is the same.  Urgent Medical and [facility] [address] [place] [phone]   [facility] [address] [place] or [address] [phone]   [facility] [address] [place] [phone], phone; [phone], fax Sees patients 1st and 3rd Saturday of every month.  Must not  qualify for public or private insurance (i.e. Medicaid, Medicare, [place] Health Choice, Veterans' Benefits)  Household income should be no more than 200% of the poverty level The clinic cannot treat you if you are pregnant or think you are pregnant  Sexually transmitted diseases are not treated at the clinic.    Dental Care: Organization         Address  Phone  Notes  [facility] Department of [facility] [phone] Accepts children up to age 83 who are enrolled in [place] or [place]; pregnant women with a Medicaid card; and children who have applied for Medicaid or [place] Health Choice, but were declined, whose parents can pay a reduced fee at time of service.  [facility] Department of [facility]  [address], [place] [phone] Accepts children up to age 80 who are enrolled in [place] or [place]; pregnant women with a Medicaid card; and children who have applied for Medicaid or [place] Health Choice, but were declined, whose parents can pay a reduced fee at time of service.  [place] Adult Dental Access PROGRAM  [place] [phone] Patients are seen by appointment only. Walk-ins are not accepted. [place] will see patients 75 years of age and older. Monday - Tuesday (8am-5pm) Most Wednesdays (8:30-5pm) $30 per visit,  cash only  [facility] Adult Dental Access PROGRAM  [address], [facility] [phone] Patients are seen by appointment only. Walk-ins are not accepted. [place] will see patients 67 years of age and older. One Wednesday Evening (Monthly: Volunteer Based).  $30 per visit, cash only  [place]  [phone] for adults; Children under age 73, call Graduate Pediatric Dentistry at [phone]. Children aged 13-14, please call [phone] to request a pediatric application.  Dental services are provided in all areas of dental care including fillings, crowns and bridges, complete and partial dentures, implants, gum treatment, root canals, and extractions. Preventive care is also provided. Treatment is provided to both adults and children. Patients are selected via a lottery and there is often a waiting list.   [facility] [address], [place]  [phone] [email or web address]   [facility] [address] [place], [place] [phone], Ext. [phone] Second and Fourth Thursday of each month, opens at 6:30 AM; Clinic ends at 9 AM.  Patients are seen on a first-come first-served basis, and a limited number are seen during each clinic.   [facility]  [address] Hillard Danker [place], [place] [phone]   Eligibility Requirements You must have lived in [place], [place], or [place] counties for at least the last three months.   You cannot be eligible for state or federal sponsored [employer], including [employer], [place], or [employer].   You generally cannot be eligible for healthcare insurance through your employer.    How to apply: Eligibility screenings are held every Tuesday and Wednesday afternoon from 1:00 pm until 4:00 pm. You do not need an appointment for the interview!  [facility] [address], [place], [place]   [place]   [place] Department  [place]  [phone]    Behavioral Health Resources in the Community: Intensive Outpatient Programs Organization         Address  Phone  Notes  [place] [address] [place], [place] [phone]   [facility] Outpatient [address], [place], [place]   ADS: Alcohol & Drug Svcs [address], [place], [place]   [place] [address],  [place], [place] or [phone]   Substance Abuse Resources Organization         Address  Phone  Notes  Alcohol and Drug Services  [phone]   [place]  [phone]   [facility] [place]   Chinita Pester  [phone]   Residential & Outpatient Substance Abuse Program  [phone]   Psychological Services Organization         Address  Phone  Notes  [facility]  [place]  [phone]   [place] [address] [place] or [phone]    Mobile Crisis Teams Organization         Address  Phone  Notes  [facility], Mobile Crisis Care Unit  [phone]   [facility] Psychotherapeutic [facility]  [address] [place], [place]   [place] [address], [place] [phone]    Self-Help/Support Groups Organization         Address  Phone             Notes  [place]. of [place] - variety of support groups  [place] Call for more information  Narcotics Anonymous (NA), [facility] [address], [employer] [place]  2 meetings at this location   Special educational needs teacher         Address  Phone  Notes  ASAP Residential Treatment [place],    [place]  [phone]   [facility]  [address], [place] [phone], [place], [place]    [place], [place] [phone] Admissions: 8am-3pm M-F  Incentives Substance [place] [address],    [place], [place] [phone]   [facility] [address] [place], [place], [place]   [facility] [address],  [place], [place]   Insight Programs - Intensive Outpatient [address]een [address], [place], [place]   [facility] ([place].) [address].,  [place], [place] [phone] or [phone]   [facility]idential Treatment Services (RTS) [address], [place], [place] Accepts Medicaid  [facility] [place] [address],  [place] [phone] Substance Abuse/Addiction Treatment   [facility] Organization         Address  Phone  Notes  [facility]  [phone]   Domenic Schwab, PhD [address] Arlis [place], [place]   [phone] or [phone]   [facility] [facility] [place], [place] [phone]   [place] [address], [place], [place] [phone] Insurance/Medicaid/sponsorship through [facility] and Families [address], [address] [place]                                    [place], [place] [phone] [place] [address], [place] [phone]    Dr. Adele Schilder  [phone]   [facility]  [place] Dept. 1) [address] [place] 2) [place] 3)  [place] [address], [place] [phone]  [phone]   [place] [phone] or [phone] (After Hours)

## 2013-11-17 NOTE — ED Notes (Signed)
Patient transported to CT 

## 2013-11-21 NOTE — ED Provider Notes (Signed)
  Medical screening examination/treatment/procedure(s) were performed by non-physician practitioner and as supervising physician I was immediately available for consultation/collaboration.      Gerhard Munchobert Denorris Reust, MD 11/21/13 [phone]

## 2014-02-05 ENCOUNTER — Emergency Department (HOSPITAL_COMMUNITY)
Admission: EM | Admit: 2014-02-05 | Discharge: 2014-02-06 | Disposition: A | Discharge status: Home / Self Care | Payer: Medicaid Other | Attending: Emergency Medicine | Admitting: Emergency Medicine

## 2014-02-05 ENCOUNTER — Encounter (HOSPITAL_COMMUNITY): Payer: Self-pay | Admitting: Emergency Medicine

## 2014-02-05 DIAGNOSIS — Z7982 Long term (current) use of aspirin: Secondary | ICD-10-CM | POA: Insufficient documentation

## 2014-02-05 DIAGNOSIS — B379 Candidiasis, unspecified: Secondary | ICD-10-CM | POA: Insufficient documentation

## 2014-02-05 DIAGNOSIS — Z791 Long term (current) use of non-steroidal anti-inflammatories (NSAID): Secondary | ICD-10-CM | POA: Insufficient documentation

## 2014-02-05 DIAGNOSIS — B3731 Acute candidiasis of vulva and vagina: Secondary | ICD-10-CM

## 2014-02-05 DIAGNOSIS — Z8739 Personal history of other diseases of the musculoskeletal system and connective tissue: Secondary | ICD-10-CM | POA: Insufficient documentation

## 2014-02-05 DIAGNOSIS — Z880 Allergy status to penicillin: Secondary | ICD-10-CM | POA: Insufficient documentation

## 2014-02-05 DIAGNOSIS — N760 Acute vaginitis: Secondary | ICD-10-CM | POA: Insufficient documentation

## 2014-02-05 DIAGNOSIS — L0390 Cellulitis, unspecified: Secondary | ICD-10-CM

## 2014-02-05 DIAGNOSIS — B373 Candidiasis of vulva and vagina: Secondary | ICD-10-CM

## 2014-02-05 DIAGNOSIS — Z3202 Encounter for pregnancy test, result negative: Secondary | ICD-10-CM | POA: Insufficient documentation

## 2014-02-05 DIAGNOSIS — F17200 Nicotine dependence, unspecified, uncomplicated: Secondary | ICD-10-CM | POA: Insufficient documentation

## 2014-02-05 DIAGNOSIS — B9689 Other specified bacterial agents as the cause of diseases classified elsewhere: Secondary | ICD-10-CM | POA: Insufficient documentation

## 2014-02-05 DIAGNOSIS — L0291 Cutaneous abscess, unspecified: Secondary | ICD-10-CM | POA: Insufficient documentation

## 2014-02-05 DIAGNOSIS — A499 Bacterial infection, unspecified: Secondary | ICD-10-CM | POA: Insufficient documentation

## 2014-02-05 NOTE — ED Notes (Signed)
Pt c/o abscess to upper inner L thigh, and vaginal discharge, white, thick discharge.

## 2014-02-06 LAB — URINE MICROSCOPIC-ADD ON

## 2014-02-06 LAB — URINALYSIS, ROUTINE W REFLEX MICROSCOPIC
Bilirubin Urine: NEGATIVE
GLUCOSE, UA: NEGATIVE mg/dL
HGB URINE DIPSTICK: NEGATIVE
Ketones, ur: NEGATIVE mg/dL
Nitrite: NEGATIVE
PH: 6 (ref 5.0–8.0)
Protein, ur: NEGATIVE mg/dL
SPECIFIC GRAVITY, URINE: 1.017 (ref 1.005–1.030)
Urobilinogen, UA: 1 mg/dL (ref 0.0–1.0)

## 2014-02-06 LAB — WET PREP, GENITAL: TRICH WET PREP: NONE SEEN

## 2014-02-06 LAB — POC URINE PREG, ED: PREG TEST UR: NEGATIVE

## 2014-02-06 LAB — GC/CHLAMYDIA PROBE AMP
CT Probe RNA: NEGATIVE
GC Probe RNA: NEGATIVE

## 2014-02-06 LAB — CBG MONITORING, ED: Glucose-Capillary: 105 mg/dL — ABNORMAL HIGH (ref 70–99)

## 2014-02-06 MED ORDER — FLUCONAZOLE 150 MG PO TABS
150.0000 mg | ORAL_TABLET | Freq: Once | ORAL | Status: AC
  Administered 2014-02-06: 150 mg via ORAL
  Filled 2014-02-06: qty 1

## 2014-02-06 MED ORDER — METRONIDAZOLE 500 MG PO TABS: 500.0000 mg | ORAL_TABLET | Freq: Two times a day (BID) | ORAL | Status: DC

## 2014-02-06 MED ORDER — IBUPROFEN 800 MG PO TABS
800.0000 mg | ORAL_TABLET | Freq: Once | ORAL | Status: AC
  Administered 2014-02-06: 800 mg via ORAL
  Filled 2014-02-06: qty 1

## 2014-02-06 MED ORDER — HYDROCODONE-ACETAMINOPHEN 5-325 MG PO TABS
1.0000 | ORAL_TABLET | Freq: Once | ORAL | Status: AC
  Administered 2014-02-06: 1 via ORAL
  Filled 2014-02-06: qty 1

## 2014-02-06 MED ORDER — HYDROCODONE-ACETAMINOPHEN 5-325 MG PO TABS: 1.0000 | ORAL_TABLET | ORAL | Status: DC | PRN

## 2014-02-06 NOTE — Discharge Instructions (Signed)
1. Medications: flagyl, vicodin, usual home medications 2. Treatment: rest, drink plenty of fluids, warm soaks at home 3. Follow Up: Please followup with your primary doctor for discussion of your diagnoses and further evaluation after today's visit; if you do not have a primary care doctor use the resource guide provided to find one;    Abscess Care After An abscess (also called a boil or furuncle) is an infected area that contains a collection of pus. Signs and symptoms of an abscess include pain, tenderness, redness, or hardness, or you may feel a moveable soft area under your skin. An abscess can occur anywhere in the body. The infection may spread to surrounding tissues causing cellulitis. A cut (incision) by the surgeon was made over your abscess and the pus was drained out. Gauze may have been packed into the space to provide a drain that will allow the cavity to heal from the inside outwards. The boil may be painful for 5 to 7 days. Most people with a boil do not have high fevers. Your abscess, if seen early, may not have localized, and may not have been lanced. If not, another appointment may be required for this if it does not get better on its own or with medications. HOME CARE INSTRUCTIONS   Only take over-the-counter or prescription medicines for pain, discomfort, or fever as directed by your caregiver.  When you bathe, soak and then remove gauze or iodoform packs at least daily or as directed by your caregiver. You may then wash the wound gently with mild soapy water. Repack with gauze or do as your caregiver directs. SEEK IMMEDIATE MEDICAL CARE IF:   You develop increased pain, swelling, redness, drainage, or bleeding in the wound site.  You develop signs of generalized infection including muscle aches, chills, fever, or a general ill feeling.  An oral temperature above 102 F (38.9 C) develops, not controlled by medication. See your caregiver for a recheck if you develop any of  the symptoms described above. If medications (antibiotics) were prescribed, take them as directed. Document Released: 04/03/2005 Document Revised: 12/08/2011 Document Reviewed: 11/29/2007 [facility] Patient Information 2014 [place], [place].    Bacterial Vaginosis Bacterial vaginosis is a vaginal infection that occurs when the normal balance of bacteria in the vagina is disrupted. It results from an overgrowth of certain bacteria. This is the most common vaginal infection in women of childbearing age. Treatment is important to prevent complications, especially in pregnant women, as it can cause a premature delivery. CAUSES  Bacterial vaginosis is caused by an increase in harmful bacteria that are normally present in smaller amounts in the vagina. Several different kinds of bacteria can cause bacterial vaginosis. However, the reason that the condition develops is not fully understood. RISK FACTORS Certain activities or behaviors can put you at an increased risk of developing bacterial vaginosis, including:  Having a new sex partner or multiple sex partners.  Douching.  Using an intrauterine device (IUD) for contraception. Women do not get bacterial vaginosis from toilet seats, bedding, swimming pools, or contact with objects around them. SIGNS AND SYMPTOMS  Some women with bacterial vaginosis have no signs or symptoms. Common symptoms include:  Grey vaginal discharge.  A fishlike odor with discharge, especially after sexual intercourse.  Itching or burning of the vagina and vulva.  Burning or pain with urination. DIAGNOSIS  Your health care provider will take a medical history and examine the vagina for signs of bacterial vaginosis. A sample of vaginal fluid may be  taken. Your health care provider will look at this sample under a microscope to check for bacteria and abnormal cells. A vaginal pH test may also be done.  TREATMENT  Bacterial vaginosis may be treated with antibiotic  medicines. These may be given in the form of a pill or a vaginal cream. A second round of antibiotics may be prescribed if the condition comes back after treatment.  HOME CARE INSTRUCTIONS   Only take over-the-counter or prescription medicines as directed by your health care provider.  If antibiotic medicine was prescribed, take it as directed. Make sure you finish it even if you start to feel better.  Do not have sex until treatment is completed.  Tell all sexual partners that you have a vaginal infection. They should see their health care provider and be treated if they have problems, such as a mild rash or itching.  Practice safe sex by using condoms and only having one sex partner. SEEK MEDICAL CARE IF:   Your symptoms are not improving after 3 days of treatment.  You have increased discharge or pain.  You have a fever. MAKE SURE YOU:   Understand these instructions.  Will watch your condition.  Will get help right away if you are not doing well or get worse. FOR MORE INFORMATION  Centers for Disease Control and Prevention, Division of STD Prevention: SolutionApps.co.zawww.cdc.gov/std American Sexual Health Association (ASHA): www.ashastd.org  Document Released: 09/15/2005 Document Revised: 07/06/2013 Document Reviewed: 04/27/2013 [facility] Patient Information 2014 [place].    Emergency Department Resource Guide 1) Find a Doctor and Pay Out of Pocket Although you won't have to find out who is covered by your insurance plan, it is a good idea to ask around and get recommendations. You will then need to call the office and see if the doctor you have chosen will accept you as a new patient and what types of options they offer for patients who are self-pay. Some doctors offer discounts or will set up payment plans for their patients who do not have insurance, but you will need to ask so you aren't surprised when you get to your appointment.  2) Contact Your Local Health Department Not all  health departments have doctors that can see patients for sick visits, but many do, so it is worth a call to see if yours does. If you don't know where your local health department is, you can check in your phone book. The CDC also has a tool to help you locate your state's health department, and many state websites also have listings of all of their local health departments.  3) Find a Walk-in Clinic If your illness is not likely to be very severe or complicated, you may want to try a walk in clinic. These are popping up all over the country in pharmacies, drugstores, and shopping centers. They're usually staffed by nurse practitioners or physician assistants that have been trained to treat common illnesses and complaints. They're usually fairly quick and inexpensive. However, if you have serious medical issues or chronic medical problems, these are probably not your best option.  No Primary Care Doctor: - Call Health Connect at  [phone] - they can help you locate a primary care doctor that  accepts your insurance, provides certain services, etc. - Physician Referral Service- [phone]  Chronic Pain Problems: Organization         Address  Phone   Notes  [facility]  [phone] Patients need to be referred  by their primary care doctor.   Medication Assistance: Organization         Address  Phone   Notes  [facility] Medication [facility] [address] [place] [place], [place] [address] [phone] --Must be a resident of [facility] -- Must have NO insurance coverage whatsoever (no Medicaid/ Medicare, etc.) -- The pt. MUST have a primary care doctor that directs their care regularly and follows them in the community   [facility]ist  [phone]   [employer]  [phone]    Agencies that provide inexpensive medical care: Organization         Address  Phone   Notes  [facility]  [phone]   [facility]     [phone]   [facility] [address] [place], [place] [address] [phone]   [facility] [address] [place] [phone]   [facility]anned Parenthood    [phone]   [facility]    [phone]   [facility] [employer] [facility]  [address], [place] Phone:  [phone], Fax:  [phone] Hours of Operation:  9 am - 6 pm, M-F.  Also accepts Medicaid/Medicare and self-pay.  [facility]  [address], [place] Phone: [phone], Fax: [phone]. Hours of Operation:  8:30 am - 5:30 pm, M-F.  Also accepts Medicaid and self-pay.  [facility] [place] [address], [place] Phone: [phone]   [facility] [address] Natasha Bence [place], [place] [phone], Ext. [phone] Mondays & Thursdays: 7-9 AM.  First 15 patients are seen on a first come, first serve basis.    Medicaid-accepting [facility] Providers:  Organization         Address  Phone   Notes  [facility] [address],  [phone] Also accepts self-pay patients.  [facility] [address] Laurell Josephs [place], [place]  [phone]   [facility] [address] [place] [address], [place] [phone]   [facility] [address], [place] [phone]   Renaye Rakers [address], [place]   [phone] Only accepts [place] Access [place] patients after they have their name applied to their card.   Self-Pay (no insurance) in [facility]:  Organization         Address  Phone   Notes  Sickle Cell Patients, [facility] Internal Medicine [address] [place], [place] [phone]   [facility] [address] [place], [place] [phone]   [facility] [place]  [address], [place] [phone]     [facility]/Dr. Osei-Bonsu  [address], [place] or [address], [place] [phone] Phone number for both [place] and [place] locations is the same.  [facility] [address] [place] [phone]   [facility] [address], [place] or [address] [phone]   [facility] [address] [place] [phone], phone; [phone], fax Sees patients 1st and 3rd Saturday of every month.  Must not qualify for public or private insurance (i.e. Medicaid, Medicare, [place] Health Choice, Veterans' Benefits)  Household income should be no  more than 200% of the poverty level The clinic cannot treat you if you are pregnant or think you are pregnant  Sexually transmitted diseases are not treated at the clinic.    Dental Care: Organization         Address  Phone  Notes  [facility] Department of [facility] [address] [place], [place] [phone] Accepts children up to age 83 who are enrolled in [place] or [place] Health Choice; pregnant women with a Medicaid card; and children who have applied for Medicaid or [place] Health Choice, but were declined, whose parents can pay a reduced fee at time of service.  [facility] Department of [facility]  [address], [place] [phone] Accepts children up to age 58 who are enrolled in [place] or [place] Health Choice; pregnant women with a Medicaid card; and children who have applied for Medicaid or  Health Choice, but were declined, whose parents can pay a reduced fee at time of service.  [facility]  [address] [place], [place] [phone] Patients are seen by appointment only. Walk-ins are not accepted. [facility] will see patients 75 years of age and older. Monday - Tuesday (8am-5pm) Most Wednesdays (8:30-5pm) $30 per visit,  cash only  [facility]  [address], [facility] [phone] Patients are seen by appointment only. Walk-ins are not accepted. [facility] will see patients 8 years of age and older. One Wednesday Evening (Monthly: Volunteer Based).  $30 per visit, cash only  [employer]  [phone] for adults; Children under age 80, call Graduate Pediatric Dentistry at [phone]. Children aged 26-14, please call [phone] to request a pediatric application.  Dental services are provided in all areas of dental care including fillings, crowns and bridges, complete and partial dentures, implants, gum treatment, root canals, and extractions. Preventive care is also provided. Treatment is provided to both adults and children. Patients are selected via a lottery and there is often a waiting list.   [facility] [address] [place]  [phone] www.drcivils.com   [facility] [address] [place], [place] [phone], Ext. [phone] Second and Fourth Thursday of each month, opens at 6:30 AM; Clinic ends at 9 AM.  Patients are seen on a first-come first-served basis, and a limited number are seen during each clinic.   [facility]  [address] Ether Griffins [place], [place] [phone]   Eligibility Requirements You must have lived in [place], [place], or [place] counties for at least the last three months.   You cannot be eligible for state or federal sponsored [place], including [employer], [place], or [employer].   You generally cannot be eligible for healthcare insurance through your employer.    How to apply: Eligibility screenings are held every Tuesday and Wednesday afternoon from 1:00 pm until 4:00 pm. You do not need an appointment for the interview!  [facility] [address] [place], [place] [phone]   [facility] Health [facility]   [phone]   [facility] Health Department  [phone]   [facility] Health Department  [phone]    Behavioral Health Resources in the Community: Intensive Outpatient Programs Organization         Address  Phone  Notes  [facility] Services [address] [place], [place] [phone]   [facility] Out[facility] [address], [place], [place] [phone]   [facility] [address] [place] [address] [place], [place]  [phone]   [facility] Mental Health [address],  [place], [place] [phone] or [phone]   Substance Abuse Resources Organization         Address  Phone  Notes  [facility]  [phone]   [facility]  [phone]   [facility]  [phone]   Floydene Flock  [phone]   Residential & Outpatient Substance Abuse Program  [phone]   Psychological Services Organization         Address  Phone  Notes  [facility] Behavioral Health  [phone]   [facility]  [phone]   [facility] Mental Health [address] [place] [phone] or [phone]    Mobile Crisis Teams Organization         Address  Phone  Notes  [facility], Mobile Crisis Care Unit  [phone]   [facility]  [address] [place], [place] [phone]   Doristine Locks [address] [place] [phone]    Self-Help/Support Groups Organization         Address  Phone             Notes  [facility] [place] - variety of support groups  336- I7437963 Call for more information  Narcotics Anonymous (NA), [facility] [address] [place] [address] [employer] [place]  2 meetings at this location   Statistician         Address  Phone  Notes  [facility] [address],    [place]  [phone]   [facility]  [address], [place] [address], [place], [place] [phone]    [facility] [address] [place], [place] [phone] Admissions: 8am-3pm M-F  [facility] [address],    [place], [place] [phone]   [facility] [address] [place], [place], [place] [phone]   [facility] [address],  [place], [place] [phone]   [facility] - Intensive Outpatient [address] Laurell Josephs [address], [place], [place] [phone]   [facility] ([facility] [address] [place].,  [place], [place] [phone] or [phone]   [facility] ([facility]) [address] [place], [place] [phone] Accepts Medicaid  [facility] [address],  [place] [phone] Substance Abuse/Addiction Treatment   [facility] Organization         Address  Phone  Notes  [facility]  [phone]   Angie Fava, PhD [address] Ervin Knack [place], [place]   [phone] or [phone]   [facility]   [address] [place], [place] [phone]   [facility] [address] [place], [place] [phone] Insurance/Medicaid/sponsorship through [facility] and Families [address]                                    [place], [place] [phone] Therapy/tele-psych/case  [facility] [address] [place], [place] [phone]    Dr. Lolly Mustache  [phone]   [facility]  [employer] [facility] Dept. 1) [address], [place] 2) [address] [place] 3)  [phone]   [address], [place] [phone]  [phone]   [facility] Child Abuse Hotline [phone]) [phone] or [phone]) [phone] (After Hours)

## 2014-02-06 NOTE — ED Provider Notes (Signed)
CSN: [id number]     Arrival date & time 02/05/14  2345 History   First MD Initiated Contact with Patient 02/05/14 2359     Chief Complaint  Patient presents with  . Abscess  . Vaginal Discharge     (Consider location/radiation/quality/duration/timing/severity/associated sxs/prior Treatment) Patient is a 23 y.o. female presenting with abscess and vaginal discharge. The history is provided by the patient and medical records. No language interpreter was used.  Abscess Associated symptoms: no fatigue, no fever, no headaches, no nausea and no vomiting   Vaginal Discharge Associated symptoms: no abdominal pain, no dysuria, no fever, no nausea and no vomiting     Janice Duarte is a 23 y.o. female  with a hx of scoliosis presents to the Emergency Department complaining of gradual, persistent, progressively worsening approximately 2 days ago. Patient reports she attempted to use over-the-counter Monistat without relief. She denies itching, dysuria, odor, abdominal pain, nausea, vomiting, diarrhea, weakness, dizziness, syncope. Patient also reports boil to the left inner thigh approximately 2 days ago as well. She states she recently had one significantly larger that resolved on its own but this one is more painful.  She denies swelling in her groin. She specifically denies use of steroids, and immunocompromise state, transplant or known diabetes.  She has not attempted any over-the-counter treatments for her boil. Nothing seems to make it better and walking and wearing clothes makes it worse.       Past Medical History  Diagnosis Date  . Scoliosis   . Bacterial vaginal infection   . Headaches, cluster   . Abscess    Past Surgical History  Procedure Laterality Date  . Wisdom tooth extraction     No family history on file. History  Substance Use Topics  . Smoking status: Current Every Day Smoker  . Smokeless tobacco: Never Used  . Alcohol Use: No   OB History   Grav Para Term  Preterm Abortions TAB SAB Ect Mult Living                 Review of Systems  Constitutional: Negative for fever, diaphoresis, appetite change, fatigue and unexpected weight change.  HENT: Negative for mouth sores.   Eyes: Negative for visual disturbance.  Respiratory: Negative for cough, chest tightness, shortness of breath and wheezing.   Cardiovascular: Negative for chest pain.  Gastrointestinal: Negative for nausea, vomiting, abdominal pain, diarrhea and constipation.  Endocrine: Negative for polydipsia, polyphagia and polyuria.  Genitourinary: Positive for vaginal discharge. Negative for dysuria, urgency, frequency and hematuria.  Musculoskeletal: Negative for back pain and neck stiffness.  Skin: Negative for rash.       Abscess  Allergic/Immunologic: Negative for immunocompromised state.  Neurological: Negative for syncope, light-headedness and headaches.  Hematological: Does not bruise/bleed easily.  Psychiatric/Behavioral: Negative for sleep disturbance. The patient is not nervous/anxious.       Allergies  Bactrim and Penicillins  Home Medications   Prior to Admission medications   Medication Sig Start Date End Date Taking? Authorizing Provider  Aspirin-Salicylamide-Caffeine (BC HEADACHE POWDER PO) Take 1 packet by mouth daily as needed (for headache).    Historical Provider, MD  ibuprofen (ADVIL,MOTRIN) 200 MG tablet Take 800 mg by mouth every 8 (eight) hours as needed for headache or mild pain.    Historical Provider, MD   BP 114/67  Pulse 75  Temp(Src) 98.8 F (37.1 C) (Oral)  Resp 16  Ht 5\' 5"  (1.651 m)  Wt 165 lb (74.844 kg)  BMI 27.46  kg/m2  SpO2 100%  LMP 01/11/2014 Physical Exam  Nursing note and vitals reviewed. Constitutional: She is oriented to person, place, and time. She appears well-developed and well-nourished. No distress.  Awake, alert, nontoxic appearance  HENT:  Head: Normocephalic and atraumatic.  Mouth/Throat: Oropharynx is clear and  moist. No oropharyngeal exudate.  Eyes: Conjunctivae are normal. No scleral icterus.  Neck: Normal range of motion. Neck supple.  Cardiovascular: Normal rate, regular rhythm and intact distal pulses.   Pulmonary/Chest: Effort normal and breath sounds normal. No respiratory distress. She has no wheezes.  Abdominal: Soft. Bowel sounds are normal. She exhibits no distension and no mass. There is no hepatosplenomegaly. There is no tenderness. There is no rebound, no guarding and no CVA tenderness. Hernia confirmed negative in the right inguinal area and confirmed negative in the left inguinal area.  Genitourinary: Uterus normal. Pelvic exam was performed with patient supine. There is no rash, tenderness or lesion on the right labia. There is no rash, tenderness or lesion on the left labia. Uterus is not deviated, not enlarged, not fixed and not tender. Cervix exhibits no motion tenderness, no discharge and no friability. Right adnexum displays no mass, no tenderness and no fullness. Left adnexum displays no mass, no tenderness and no fullness. No erythema, tenderness or bleeding around the vagina. No foreign body around the vagina. No signs of injury around the vagina. Vaginal discharge ( thick, clumpy, white, nonodorous) found.  No CMT or adnexal tenderness  Musculoskeletal: Normal range of motion. She exhibits no edema.  Lymphadenopathy:    She has no cervical adenopathy.       Right: No inguinal adenopathy present.       Left: No inguinal adenopathy present.  Neurological: She is alert and oriented to person, place, and time.  Speech is clear and goal oriented Moves extremities without ataxia  Skin: Skin is warm and dry. No rash noted. She is not diaphoretic. There is erythema.  2x2cm area of induration with mild fluctuance noted to the Left medial upper thigh; no streaking  Psychiatric: She has a normal mood and affect.    ED Course  INCISION AND DRAINAGE Date/Time: 02/06/2014 12:18  AM Performed by: Dierdre ForthMUTHERSBAUGH, Brittnee Gaetano Authorized by: Dierdre ForthMUTHERSBAUGH, Akif Weldy Consent: Verbal consent obtained. Risks and benefits: risks, benefits and alternatives were discussed Consent given by: patient Patient understanding: patient states understanding of the procedure being performed Patient consent: the patient's understanding of the procedure matches consent given Procedure consent: procedure consent matches procedure scheduled Relevant documents: relevant documents present and verified Site marked: the operative site was marked Imaging studies: imaging studies available Required items: required blood products, implants, devices, and special equipment available Patient identity confirmed: verbally with patient and arm band Time out: Immediately prior to procedure a "time out" was called to verify the correct patient, procedure, equipment, support staff and site/side marked as required. Type: abscess Body area: lower extremity Location details: left leg Anesthesia: local infiltration Local anesthetic: lidocaine 2% without epinephrine Anesthetic total: 3 ml Patient sedated: no Scalpel size: 11 Incision type: single straight Complexity: simple Drainage: purulent Drainage amount: moderate Wound treatment: wound left open Patient tolerance: Patient tolerated the procedure well with no immediate complications.   (including critical care time) Labs Review Labs Reviewed  WET PREP, GENITAL - Abnormal; Notable for the following:    Yeast Wet Prep HPF POC FEW (*)    Clue Cells Wet Prep HPF POC MANY (*)    WBC, Wet Prep HPF POC FEW (*)  All other components within normal limits  URINALYSIS, ROUTINE W REFLEX MICROSCOPIC - Abnormal; Notable for the following:    APPearance CLOUDY (*)    Leukocytes, UA SMALL (*)    All other components within normal limits  URINE MICROSCOPIC-ADD ON - Abnormal; Notable for the following:    Squamous Epithelial / LPF FEW (*)    All other components  within normal limits  CBG MONITORING, ED - Abnormal; Notable for the following:    Glucose-Capillary 105 (*)    All other components within normal limits  GC/CHLAMYDIA PROBE AMP  URINE CULTURE  POC URINE PREG, ED    Imaging Review No results found.   EKG Interpretation None      EMERGENCY DEPARTMENT US SOFT TISSUE INTERPRETATION "Study: Limited Ultrasound of the noted body part in comments below"  INDICATIONS: Pain and Soft tissue infection Multiple views of the body part are obtained with a multi-frequency linear probe  PERFORMED BY:  Myself  IMAGES ARCHIVED?: Yes  SIDE:Left  BODY PART:Lower extremity  FINDINGS: Abcess present and Cellulitis present  LIMITATIONS:  Body Habitus  INTERPRETATION:  Abcess present and Cellulitis present  COMMENT:  Small abscess with surrounding cellulitis present to the Left upper medial thigh   MDM   Final diagnoses:  Vaginal yeast infection  BV (bacterial vaginosis)  Abscess   Janice Duarte presents for vaginal discharge and left medial upper thigh abscess.  Pelvic exam with clinical evidence of yeast, possibly BV as well.  Pt also with small abscess which will be amenable to I&D.    Patient with skin abscess amenable to incision and drainage.  Abscess was not large enough to warrant packing or drain,  wound recheck in 2 days. Encouraged home warm soaks and flushing.  Mild signs of cellulitis is surrounding skin.  Will d/c to home.  No antibiotic therapy is indicated.  12:59 AM Pt without hx of immunocompromise or DM, but will check CBG as she has abscess and yeast.  CBG 105.  Wet prep with yeast and clue cells. Urinalysis with out evidence of urinary tract infection. Patient yeast infection treated with Diflucan here in the emergency department. We'll write prescription for Flagyl to treat BV.  Recommended patient followup with her primary care provider and or OB/GYN for further evaluation within one week. She's to return to  the emergency department for abdominal pain, intractable vomiting, fevers or enlargement of her abscess.  It has been determined that no acute conditions requiring further emergency intervention are present at this time. The patient/guardian have been advised of the diagnosis and plan. We have discussed signs and symptoms that warrant return to the ED, such as changes or worsening in symptoms.   Vital signs are stable at discharge.   BP 114/67  Pulse 75  Temp(Src) 98.8 F (37.1 C) (Oral)  Resp 16  Ht 5\' 5"  (1.651 m)  Wt 165 lb (74.844 kg)  BMI 27.46 kg/m2  SpO2 100%  LMP 01/11/2014  Patient/guardian has voiced understanding and agreed to follow-up with the PCP or specialist.      Dierdre Forth, PA-C 02/06/14 1610

## 2014-02-06 NOTE — ED Provider Notes (Signed)
Medical screening examination/treatment/procedure(s) were performed by non-physician practitioner and as supervising physician I was immediately available for consultation/collaboration.   EKG Interpretation None       Kvion Shapley, MD 02/06/14 0606 

## 2014-02-07 LAB — URINE CULTURE: Colony Count: 40000

## 2014-03-08 ENCOUNTER — Emergency Department (HOSPITAL_COMMUNITY)
Admission: EM | Admit: 2014-03-08 | Discharge: 2014-03-08 | Disposition: A | Discharge status: Home / Self Care | Payer: Medicaid Other | Attending: Emergency Medicine | Admitting: Emergency Medicine

## 2014-03-08 ENCOUNTER — Encounter (HOSPITAL_COMMUNITY): Payer: Self-pay | Admitting: Emergency Medicine

## 2014-03-08 DIAGNOSIS — Z880 Allergy status to penicillin: Secondary | ICD-10-CM | POA: Insufficient documentation

## 2014-03-08 DIAGNOSIS — L03319 Cellulitis of trunk, unspecified: Principal | ICD-10-CM

## 2014-03-08 DIAGNOSIS — Z792 Long term (current) use of antibiotics: Secondary | ICD-10-CM | POA: Insufficient documentation

## 2014-03-08 DIAGNOSIS — Z8742 Personal history of other diseases of the female genital tract: Secondary | ICD-10-CM | POA: Insufficient documentation

## 2014-03-08 DIAGNOSIS — L03314 Cellulitis of groin: Secondary | ICD-10-CM

## 2014-03-08 DIAGNOSIS — L02219 Cutaneous abscess of trunk, unspecified: Secondary | ICD-10-CM | POA: Insufficient documentation

## 2014-03-08 DIAGNOSIS — Z8619 Personal history of other infectious and parasitic diseases: Secondary | ICD-10-CM | POA: Insufficient documentation

## 2014-03-08 DIAGNOSIS — L731 Pseudofolliculitis barbae: Secondary | ICD-10-CM

## 2014-03-08 DIAGNOSIS — Z8739 Personal history of other diseases of the musculoskeletal system and connective tissue: Secondary | ICD-10-CM | POA: Insufficient documentation

## 2014-03-08 DIAGNOSIS — Z79899 Other long term (current) drug therapy: Secondary | ICD-10-CM | POA: Insufficient documentation

## 2014-03-08 DIAGNOSIS — L738 Other specified follicular disorders: Secondary | ICD-10-CM | POA: Insufficient documentation

## 2014-03-08 LAB — WET PREP, GENITAL
TRICH WET PREP: NONE SEEN
Yeast Wet Prep HPF POC: NONE SEEN

## 2014-03-08 LAB — GC/CHLAMYDIA PROBE AMP
CT Probe RNA: NEGATIVE
GC Probe RNA: NEGATIVE

## 2014-03-08 MED ORDER — CLINDAMYCIN HCL 300 MG PO CAPS
300.0000 mg | ORAL_CAPSULE | Freq: Once | ORAL | Status: AC
  Administered 2014-03-08: 300 mg via ORAL
  Filled 2014-03-08: qty 1

## 2014-03-08 MED ORDER — LIDOCAINE HCL 1 % IJ SOLN
INTRAMUSCULAR | Status: AC
  Administered 2014-03-08: 20 mL
  Filled 2014-03-08: qty 20

## 2014-03-08 MED ORDER — OXYCODONE-ACETAMINOPHEN 5-325 MG PO TABS
2.0000 | ORAL_TABLET | Freq: Once | ORAL | Status: AC
  Administered 2014-03-08: 2 via ORAL
  Filled 2014-03-08: qty 2

## 2014-03-08 MED ORDER — HYDROCODONE-ACETAMINOPHEN 5-325 MG PO TABS: 1.0000 | ORAL_TABLET | Freq: Four times a day (QID) | ORAL | Status: DC | PRN

## 2014-03-08 MED ORDER — CLINDAMYCIN HCL 150 MG PO CAPS: 300.0000 mg | ORAL_CAPSULE | Freq: Three times a day (TID) | ORAL | Status: DC

## 2014-03-08 NOTE — Discharge Instructions (Signed)
Take Clindamycin as prescribed. Keep the area clean and dry. I recommend continued warm soaks and compresses to the area. Follow up for a recheck in 48 hours.  Cellulitis Cellulitis is an infection of the skin and the tissue under the skin. The infected area is usually red and tender. This happens most often in the arms and lower legs. HOME CARE   Take your antibiotic medicine as told. Finish the medicine even if you start to feel better.  Keep the infected arm or leg raised (elevated).  Put a warm cloth on the area up to 4 times per day.  Only take medicines as told by your doctor.  Keep all doctor visits as told. GET HELP RIGHT AWAY IF:   You have a fever.  You feel very sleepy.  You throw up (vomit) or have watery poop (diarrhea).  You feel sick and have muscle aches and pains.  You see red streaks on the skin coming from the infected area.  Your red area gets bigger or turns a dark color.  Your bone or joint under the infected area is painful after the skin heals.  Your infection comes back in the same area or different area.  You have a puffy (swollen) bump in the infected area.  You have new symptoms. MAKE SURE YOU:   Understand these instructions.  Will watch your condition.  Will get help right away if you are not doing well or get worse. Document Released: 03/03/2008 Document Revised: 03/16/2012 Document Reviewed: 12/01/2011 [facility] Patient Information 2014 [place], [place].

## 2014-03-08 NOTE — ED Provider Notes (Signed)
CSN: [id number]     Arrival date & time 03/08/14  0316 History   First MD Initiated Contact with Patient 03/08/14 0321     Chief Complaint  Patient presents with  . Vaginal Discharge  . Recurrent Skin Infections    groin    (Consider location/radiation/quality/duration/timing/severity/associated sxs/prior Treatment) HPI Comments: Patient is a 23 y/o female who presents for swelling to her L groin. Patient states that swelling has been present over the last few days, worsening since onset. Patient has tried warm compresses to the area, but swelling has persisted without relief. Area associated with a constant aching, nonradiating pain. Pain not improved with NSAIDs. No associated fever, draining, weeping, abdominal pain, vomiting, vaginal bleeding, vaginal d/c, dysuria, hematuria, or red streaking.  Patient also concerned about a yeast infection. She denies vaginal d/c, but states the area has felt irritable. She thinks it may be secondary to the Flagyl course she completed 3 weeks ago for BV. No pelvic pain.  Patient is a 23 y.o. female presenting with vaginal discharge. The history is provided by the patient. No language interpreter was used.  Vaginal Discharge Associated symptoms: no abdominal pain, no dysuria, no fever and no vomiting     Past Medical History  Diagnosis Date  . Scoliosis   . Bacterial vaginal infection   . Headaches, cluster   . Abscess    Past Surgical History  Procedure Laterality Date  . Wisdom tooth extraction     History reviewed. No pertinent family history. History  Substance Use Topics  . Smoking status: Never Smoker   . Smokeless tobacco: Never Used  . Alcohol Use: No   OB History   Grav Para Term Preterm Abortions TAB SAB Ect Mult Living                  Review of Systems  Constitutional: Negative for fever.  Gastrointestinal: Negative for vomiting, abdominal pain and diarrhea.  Genitourinary: Positive for vaginal discharge. Negative for  dysuria, hematuria, vaginal bleeding and vaginal pain.       +swelling of L groin  Skin: Positive for color change.  Neurological: Negative for syncope.  All other systems reviewed and are negative.    Allergies  Bactrim and Penicillins  Home Medications   Prior to Admission medications   Medication Sig Start Date End Date Taking? Authorizing Provider  Aspirin-Salicylamide-Caffeine (BC HEADACHE POWDER PO) Take 1 packet by mouth daily as needed (for headache).    Historical Provider, MD  clindamycin (CLEOCIN) 150 MG capsule Take 2 capsules (300 mg total) by mouth 3 (three) times daily. Your next dose is due at 1PM today 03/08/14   Antony Madura, PA-C  HYDROcodone-acetaminophen (NORCO/VICODIN) 5-325 MG per tablet Take 1-2 tablets by mouth every 6 (six) hours as needed for moderate pain or severe pain. 03/08/14   Antony Madura, PA-C  metroNIDAZOLE (FLAGYL) 500 MG tablet Take 1 tablet (500 mg total) by mouth 2 (two) times daily. One po bid x 7 days 02/06/14   Dahlia Client Muthersbaugh, PA-C   BP 132/84  Pulse 64  Temp(Src) 98.3 F (36.8 C) (Oral)  Resp 18  Ht 5' 5.5" (1.664 m)  Wt 156 lb (70.761 kg)  BMI 25.56 kg/m2  SpO2 100%  LMP 02/19/2014  Physical Exam  Nursing note and vitals reviewed. Constitutional: She is oriented to person, place, and time. She appears well-developed and well-nourished. No distress.  HENT:  Head: Normocephalic and atraumatic.  Eyes: Conjunctivae and EOM are normal. No scleral icterus.  Neck: Normal range of motion.  Pulmonary/Chest: Effort normal. No respiratory distress.  Abdominal: Soft. She exhibits no distension. There is no tenderness. There is no rebound and no guarding.  Soft, nontender. No peritoneal signs.  Genitourinary:    There is no rash, tenderness, lesion or injury on the right labia. There is no rash, tenderness, lesion or injury on the left labia. Uterus is not tender. Cervix exhibits no motion tenderness, no discharge and no friability. Right  adnexum displays no mass, no tenderness and no fullness. Left adnexum displays no mass, no tenderness and no fullness. No erythema, tenderness or bleeding around the vagina. No signs of injury around the vagina. Vaginal discharge (opaque, clumped) found.  No CMT or adnexal TTP. Vaginal vault with moderate amount of white, opaque, clumped discharge.  Musculoskeletal: Normal range of motion.  Neurological: She is alert and oriented to person, place, and time.  Skin: Skin is warm and dry. No rash noted. She is not diaphoretic. No pallor.  See GU  Psychiatric: She has a normal mood and affect. Her behavior is normal.    ED Course  Procedures (including critical care time) Labs Review Labs Reviewed  WET PREP, GENITAL - Abnormal; Notable for the following:    Clue Cells Wet Prep HPF POC FEW (*)    WBC, Wet Prep HPF POC FEW (*)    All other components within normal limits  GC/CHLAMYDIA PROBE AMP    Imaging Review No results found.   EKG Interpretation None     INCISION AND DRAINAGE Performed by: Antony MaduraHUMES, Ron Junco Consent: Verbal consent obtained. Risks and benefits: risks, benefits and alternatives were discussed Type: abscess  Body area: L groin  Anesthesia: local infiltration  Incision was made with a scalpel.  Local anesthetic: lidocaine 2% without epinephrine  Anesthetic total: 6 ml  Complexity: complex Blunt dissection to break up loculations  Drainage: none  Drainage amount: 0cc  Packing material: none  Patient tolerance: Patient tolerated the procedure well with no immediate complications.   EMERGENCY DEPARTMENT US SOFT TISSUE INTERPRETATION "Study: Limited Soft Tissue Ultrasound"  INDICATIONS: Soft tissue infection Multiple views of the body part were obtained in real-time with a multi-frequency linear probe PERFORMED BY:  Myself IMAGES ARCHIVED?: Yes SIDE:Left BODY PART: L groin FINDINGS: No abcess noted and Cellulitis present INTERPRETATION:  No abcess  noted and Cellulitis present  MDM   Final diagnoses:  Cellulitis of groin, left  Ingrown hair    23 year old female presents for left groin swelling. I&D performed at bedside which was well tolerated. I&D revealed swelling of soft tissue with hair extracted from incision site. No purulent drainage with I&D, therefore, U/S performed at bedside to further evaluate for abscess pocket. No distinct abscess pocket seen on U/S. Areas of induration correlate with areas of soft tissue swelling on U/S imaging today. Given discomfort and hx of skin infections, will place on Clindamycin. Recheck advised in 48 hours.  Patient also c/o vaginal "irritation". Wet prep reveals only few clue cells; no yeast. Patient denies vaginal discharge and pelvic pain. No compelling findings or symptoms to warrant tx for BV at this time. Abdomen soft and nontender. No peritoneal signs. Results reviewed with patient who has chosen to manage symptoms with outpatient OTC remedies and to return if symptoms worsen. Return precautions provided and patient agreeable to plan with no unaddressed concerns.   Filed Vitals:   03/08/14 0323 03/08/14 0455  BP: 138/92 132/84  Pulse: 66 64  Temp: 98.3 F (36.8 C)  98.3 F (36.8 C)  TempSrc: Oral Oral  Resp: 18 18  Height: 5' 5.5" (1.664 m)   Weight: 156 lb (70.761 kg)   SpO2: 100% 100%      Antony Madura, PA-C 03/08/14 [phone]

## 2014-03-08 NOTE — ED Provider Notes (Signed)
Medical screening examination/treatment/procedure(s) were performed by non-physician practitioner and as supervising physician I was immediately available for consultation/collaboration.   EKG Interpretation None        Akari Defelice, MD 03/08/14 0647 

## 2014-03-08 NOTE — ED Notes (Signed)
Pelvic cart and I&D tray at bedside

## 2014-03-08 NOTE — ED Notes (Signed)
Patient c/o recurrent boil to her groin area after shaving. Patient also reports she believes she has a yeast infection, states it's "irritable" when asked what makes her believe she has a yeast infection.

## 2014-04-06 ENCOUNTER — Encounter (HOSPITAL_COMMUNITY): Payer: Self-pay | Admitting: Emergency Medicine

## 2014-04-06 ENCOUNTER — Emergency Department (HOSPITAL_COMMUNITY)
Admission: EM | Admit: 2014-04-06 | Discharge: 2014-04-06 | Disposition: A | Discharge status: Home / Self Care | Payer: Medicaid Other | Attending: Emergency Medicine | Admitting: Emergency Medicine

## 2014-04-06 DIAGNOSIS — Z3202 Encounter for pregnancy test, result negative: Secondary | ICD-10-CM | POA: Insufficient documentation

## 2014-04-06 DIAGNOSIS — Z872 Personal history of diseases of the skin and subcutaneous tissue: Secondary | ICD-10-CM | POA: Insufficient documentation

## 2014-04-06 DIAGNOSIS — N730 Acute parametritis and pelvic cellulitis: Secondary | ICD-10-CM | POA: Insufficient documentation

## 2014-04-06 DIAGNOSIS — Z880 Allergy status to penicillin: Secondary | ICD-10-CM | POA: Insufficient documentation

## 2014-04-06 DIAGNOSIS — Z8742 Personal history of other diseases of the female genital tract: Secondary | ICD-10-CM | POA: Insufficient documentation

## 2014-04-06 DIAGNOSIS — Z8619 Personal history of other infectious and parasitic diseases: Secondary | ICD-10-CM | POA: Insufficient documentation

## 2014-04-06 DIAGNOSIS — Z8739 Personal history of other diseases of the musculoskeletal system and connective tissue: Secondary | ICD-10-CM | POA: Insufficient documentation

## 2014-04-06 LAB — WET PREP, GENITAL
Clue Cells Wet Prep HPF POC: NONE SEEN
Trich, Wet Prep: NONE SEEN
WBC, Wet Prep HPF POC: NONE SEEN
Yeast Wet Prep HPF POC: NONE SEEN

## 2014-04-06 LAB — URINALYSIS, ROUTINE W REFLEX MICROSCOPIC
Bilirubin Urine: NEGATIVE
Glucose, UA: NEGATIVE mg/dL
Hgb urine dipstick: NEGATIVE
Ketones, ur: NEGATIVE mg/dL
LEUKOCYTES UA: NEGATIVE
NITRITE: NEGATIVE
PH: 7 (ref 5.0–8.0)
Protein, ur: NEGATIVE mg/dL
SPECIFIC GRAVITY, URINE: 1.021 (ref 1.005–1.030)
Urobilinogen, UA: 0.2 mg/dL (ref 0.0–1.0)

## 2014-04-06 LAB — GC/CHLAMYDIA PROBE AMP
CT PROBE, AMP APTIMA: NEGATIVE
GC PROBE AMP APTIMA: NEGATIVE

## 2014-04-06 LAB — POC URINE PREG, ED: PREG TEST UR: NEGATIVE

## 2014-04-06 MED ORDER — LIDOCAINE HCL 1 % IJ SOLN
INTRAMUSCULAR | Status: AC
  Administered 2014-04-06: 20 mL
  Filled 2014-04-06: qty 20

## 2014-04-06 MED ORDER — DOXYCYCLINE HYCLATE 100 MG PO CAPS: 100.0000 mg | ORAL_CAPSULE | Freq: Two times a day (BID) | ORAL | Status: DC

## 2014-04-06 MED ORDER — CEFTRIAXONE SODIUM 250 MG IJ SOLR
250.0000 mg | Freq: Once | INTRAMUSCULAR | Status: AC
  Administered 2014-04-06: 250 mg via INTRAMUSCULAR
  Filled 2014-04-06: qty 250

## 2014-04-06 NOTE — Discharge Instructions (Signed)
Pelvic Inflammatory Disease °Pelvic inflammatory disease (PID) refers to an infection in some or all of the female organs. The infection can be in the uterus, ovaries, fallopian tubes, or the surrounding tissues in the pelvis. PID can cause abdominal or pelvic pain that comes on suddenly (acute pelvic pain). PID is a serious infection because it can lead to lasting (chronic) pelvic pain or the inability to have children (infertile).  °CAUSES  °The infection is often caused by the normal bacteria found in the vaginal tissues. PID may also be caused by an infection that is spread during sexual contact. PID can also occur following:  °· The birth of a baby.   °· A miscarriage.   °· An abortion.   °· Major pelvic surgery.   °· The use of an intrauterine device (IUD).   °· A sexual assault.   °RISK FACTORS °Certain factors can put a person at higher risk for PID, such as: °· Being younger than 25 years. °· Being sexually active at a young age. °· Using nonbarrier contraception. °· Having multiple sexual partners. °· Having sex with someone who has symptoms of a genital infection. °· Using oral contraception. °Other times, certain behaviors can increase the possibility of getting PID, such as: °· Having sex during your period. °· Using a vaginal douche. °· Having an intrauterine device (IUD) in place. °SYMPTOMS  °· Abdominal or pelvic pain.   °· Fever.   °· Chills.   °· Abnormal vaginal discharge. °· Abnormal uterine bleeding.   °· Unusual pain shortly after finishing your period. °DIAGNOSIS  °Your caregiver will choose some of the following methods to make a diagnosis, such as:  °· Performing a physical exam and history. A pelvic exam typically reveals a very tender uterus and surrounding pelvis.   °· Ordering laboratory tests including a pregnancy test, blood tests, and urine test.  °· Ordering cultures of the vagina and cervix to check for a sexually transmitted infection (STI). °· Performing an ultrasound.    °· Performing a laparoscopic procedure to look inside the pelvis.   °TREATMENT  °· Antibiotic medicines may be prescribed and taken by mouth.   °· Sexual partners may be treated when the infection is caused by a sexually transmitted disease (STD).   °· Hospitalization may be needed to give antibiotics intravenously. °· Surgery may be needed, but this is rare. °It may take weeks until you are completely well. If you are diagnosed with PID, you should also be checked for human immunodeficiency virus (HIV).   °HOME CARE INSTRUCTIONS  °· If given, take your antibiotics as directed. Finish the medicine even if you start to feel better.   °· Only take over-the-counter or prescription medicines for pain, discomfort, or fever as directed by your caregiver.   °· Do not have sexual intercourse until treatment is completed or as directed by your caregiver. If PID is confirmed, your recent sexual partner(s) will need treatment.   °· Keep your follow-up appointments. °SEEK MEDICAL CARE IF:  °· You have increased or abnormal vaginal discharge.   °· You need prescription medicine for your pain.   °· You vomit.   °· You cannot take your medicines.   °· Your partner has an STD.   °SEEK IMMEDIATE MEDICAL CARE IF:  °· You have a fever.   °· You have increased abdominal or pelvic pain.   °· You have chills.   °· You have pain when you urinate.   °· You are not better after 72 hours following treatment.   °MAKE SURE YOU:  °· Understand these instructions. °· Will watch your condition. °· Will get help right away if you are not doing well or get worse. °  Document Released: 09/15/2005 Document Revised: 01/10/2013 Document Reviewed: 09/11/2011 °[employer]® Patient Information ©2015 [employer]. This information is not intended to replace advice given to you by your health care provider. Make sure you discuss any questions you have with your health care provider. ° °

## 2014-04-06 NOTE — ED Notes (Signed)
Pt states that she has had LLQ ABD pain and a white, thick vaginal discharge for the past three days. Denies burning or itching.

## 2014-04-06 NOTE — ED Provider Notes (Signed)
CSN: [id number]     Arrival date & time 04/06/14  0038 History   First MD Initiated Contact with Patient 04/06/14 0115     Chief Complaint  Patient presents with  . Abdominal Pain  . Vaginal Discharge     (Consider location/radiation/quality/duration/timing/severity/associated sxs/prior Treatment) Patient is a 23 y.o. female presenting with abdominal pain and vaginal discharge. The history is provided by the patient. No language interpreter was used.  Abdominal Pain Pain location: pelvic. Pain quality: aching   Pain radiates to:  Does not radiate Pain severity:  Moderate Duration:  2 days Timing:  Constant Progression:  Waxing and waning Chronicity:  Recurrent Relieved by:  Nothing Worsened by:  Nothing tried Ineffective treatments:  None tried Associated symptoms: vaginal discharge   Associated symptoms: no anorexia, no chest pain, no chills, no constipation, no cough, no diarrhea, no dysuria, no fatigue, no fever, no melena, no nausea, no shortness of breath, no sore throat and no vomiting   Vaginal discharge:    Quality:  White   Severity:  Moderate   Duration:  2 days   Timing:  Constant   Progression:  Unchanged   Chronicity:  Recurrent Risk factors: not pregnant and no recent hospitalization   Vaginal Discharge Associated symptoms: abdominal pain   Associated symptoms: no dysuria, no fever, no nausea and no vomiting     Past Medical History  Diagnosis Date  . Scoliosis   . Bacterial vaginal infection   . Headaches, cluster   . Abscess    Past Surgical History  Procedure Laterality Date  . Wisdom tooth extraction     No family history on file. History  Substance Use Topics  . Smoking status: Never Smoker   . Smokeless tobacco: Never Used  . Alcohol Use: No   OB History   Grav Para Term Preterm Abortions TAB SAB Ect Mult Living                 Review of Systems  Constitutional: Negative for fever, chills, diaphoresis, activity change, appetite change  and fatigue.  HENT: Negative for congestion, facial swelling, rhinorrhea and sore throat.   Eyes: Negative for photophobia and discharge.  Respiratory: Negative for cough, chest tightness and shortness of breath.   Cardiovascular: Negative for chest pain, palpitations and leg swelling.  Gastrointestinal: Positive for abdominal pain. Negative for nausea, vomiting, diarrhea, constipation, melena and anorexia.  Endocrine: Negative for polydipsia and polyuria.  Genitourinary: Positive for vaginal discharge. Negative for dysuria, frequency, difficulty urinating and pelvic pain.  Musculoskeletal: Positive for back pain. Negative for arthralgias, neck pain and neck stiffness.  Skin: Negative for color change and wound.  Allergic/Immunologic: Negative for immunocompromised state.  Neurological: Negative for facial asymmetry, weakness, numbness and headaches.  Hematological: Does not bruise/bleed easily.  Psychiatric/Behavioral: Negative for confusion and agitation.      Allergies  Bactrim and Penicillins  Home Medications   Prior to Admission medications   Medication Sig Start Date End Date Taking? Authorizing Provider  Aspirin-Salicylamide-Caffeine (BC HEADACHE POWDER PO) Take 1 packet by mouth daily as needed (for headache).   Yes Historical Provider, MD  doxycycline (VIBRAMYCIN) 100 MG capsule Take 1 capsule (100 mg total) by mouth 2 (two) times daily. One po bid x 14 days 04/06/14   Shanna Cisco, MD   BP 128/89  Pulse 78  Temp(Src) 98.2 F (36.8 C) (Oral)  Resp 16  SpO2 100%  LMP 03/19/2014 Physical Exam  Constitutional: She is oriented to person,  place, and time. She appears well-developed and well-nourished. No distress.  HENT:  Head: Normocephalic and atraumatic.  Mouth/Throat: No oropharyngeal exudate.  Eyes: Pupils are equal, round, and reactive to light.  Neck: Normal range of motion. Neck supple.  Cardiovascular: Normal rate, regular rhythm and normal heart sounds.   Exam reveals no gallop and no friction rub.   No murmur heard. Pulmonary/Chest: Effort normal and breath sounds normal. No respiratory distress. She has no wheezes. She has no rales.  Abdominal: Soft. Bowel sounds are normal. She exhibits no distension and no mass. There is tenderness in the suprapubic area. There is no rigidity, no rebound and no guarding.  Genitourinary: Cervix exhibits no motion tenderness and no friability. Right adnexum displays no mass, no tenderness and no fullness. Left adnexum displays tenderness. Left adnexum displays no mass and no fullness. Vaginal discharge found.  Musculoskeletal: Normal range of motion. She exhibits no edema and no tenderness.  Neurological: She is alert and oriented to person, place, and time.  Skin: Skin is warm and dry.  Psychiatric: She has a normal mood and affect.    ED Course  Procedures (including critical care time) Labs Review Labs Reviewed  URINALYSIS, ROUTINE W REFLEX MICROSCOPIC - Abnormal; Notable for the following:    APPearance CLOUDY (*)    All other components within normal limits  WET PREP, GENITAL  GC/CHLAMYDIA PROBE AMP  POC URINE PREG, ED    Imaging Review No results found.   EKG Interpretation None      MDM   Final diagnoses:  PID (acute pelvic inflammatory disease)    Pt is a 23 y.o. female with Pmhx as above who presents with 2 days of thick, white vaginal d/c, low pelvic pain, and low back pain. Denies fever, chills, n/v/d, urinary symptoms, coital pain. On PE, VSS, pt in NAD. She has mild suprapubic pain, no rebound or guarding. Pelvic exam w/ moderate amt of white d/c, no friable cervix, no CMT, mild L adnexal tenderness w/o mass.   Wet prep nml, UA nml. With complaint of d/c and adnexal tenderness on pelvic exam, will treat for presumed PID w/ rocephin and 14d doxy.       Shanna CiscoMegan E Docherty, MD 04/06/14 [phone]

## 2014-05-29 ENCOUNTER — Emergency Department (HOSPITAL_COMMUNITY)
Admission: EM | Admit: 2014-05-29 | Discharge: 2014-05-29 | Disposition: A | Discharge status: Home / Self Care | Payer: Medicaid Other | Attending: Emergency Medicine | Admitting: Emergency Medicine

## 2014-05-29 ENCOUNTER — Encounter (HOSPITAL_COMMUNITY): Payer: Self-pay | Admitting: Emergency Medicine

## 2014-05-29 DIAGNOSIS — Z8739 Personal history of other diseases of the musculoskeletal system and connective tissue: Secondary | ICD-10-CM | POA: Insufficient documentation

## 2014-05-29 DIAGNOSIS — Z8669 Personal history of other diseases of the nervous system and sense organs: Secondary | ICD-10-CM | POA: Insufficient documentation

## 2014-05-29 DIAGNOSIS — Z792 Long term (current) use of antibiotics: Secondary | ICD-10-CM | POA: Insufficient documentation

## 2014-05-29 DIAGNOSIS — K089 Disorder of teeth and supporting structures, unspecified: Secondary | ICD-10-CM | POA: Insufficient documentation

## 2014-05-29 DIAGNOSIS — Z8742 Personal history of other diseases of the female genital tract: Secondary | ICD-10-CM | POA: Insufficient documentation

## 2014-05-29 DIAGNOSIS — Z8619 Personal history of other infectious and parasitic diseases: Secondary | ICD-10-CM | POA: Insufficient documentation

## 2014-05-29 DIAGNOSIS — Z872 Personal history of diseases of the skin and subcutaneous tissue: Secondary | ICD-10-CM | POA: Insufficient documentation

## 2014-05-29 DIAGNOSIS — K0889 Other specified disorders of teeth and supporting structures: Secondary | ICD-10-CM

## 2014-05-29 DIAGNOSIS — Z880 Allergy status to penicillin: Secondary | ICD-10-CM | POA: Insufficient documentation

## 2014-05-29 MED ORDER — IBUPROFEN 600 MG PO TABS: 600.0000 mg | ORAL_TABLET | Freq: Four times a day (QID) | ORAL | Status: DC | PRN

## 2014-05-29 MED ORDER — TRAMADOL HCL 50 MG PO TABS: 50.0000 mg | ORAL_TABLET | Freq: Four times a day (QID) | ORAL | Status: DC | PRN

## 2014-05-29 MED ORDER — TRAMADOL HCL 50 MG PO TABS
50.0000 mg | ORAL_TABLET | Freq: Once | ORAL | Status: AC
  Administered 2014-05-29: 50 mg via ORAL
  Filled 2014-05-29: qty 1

## 2014-05-29 NOTE — Discharge Instructions (Signed)
Dental Pain Toothache is pain in or around a tooth. It may get worse with chewing or with cold or heat.  HOME CARE  Your dentist may use a numbing medicine during treatment. If so, you may need to avoid eating until the medicine wears off. Ask your dentist about this.  Only take medicine as told by your dentist or doctor.  Avoid chewing food near the painful tooth until after all treatment is done. Ask your dentist about this. GET HELP RIGHT AWAY IF:   The problem gets worse or new problems appear.  You have a fever.  There is redness and puffiness (swelling) of the face, jaw, or neck.  You cannot open your mouth.  There is pain in the jaw.  There is very bad pain that is not helped by medicine. MAKE SURE YOU:   Understand these instructions.  Will watch your condition.  Will get help right away if you are not doing well or get worse. Document Released: 03/03/2008 Document Revised: 12/08/2011 Document Reviewed: 03/03/2008 [facility] Patient Information 2015 [place], [place]. This information is not intended to replace advice given to you by your health care provider. Make sure you discuss any questions you have with your health care provider. Call the dentist first thing this morning telling them you are being referred through the ED

## 2014-05-29 NOTE — ED Notes (Signed)
Patient is alert and oriented x3.  She is complaining of right upper and lower jaw pain that Started two days ago.  Currently she rates her pain 10 of 10.

## 2014-05-29 NOTE — ED Provider Notes (Signed)
CSN: [id number]     Arrival date & time 05/29/14  4098 History   First MD Initiated Contact with Patient 05/29/14 [phone]     Chief Complaint  Patient presents with  . Dental Pain     (Consider location/radiation/quality/duration/timing/severity/associated sxs/prior Treatment) HPI Comments: 2 dasy of pain upper and lower L posterior teeth not relieved by 1 Ibuprofen take 2 hours ago   Patient is a 23 y.o. female presenting with tooth pain. The history is provided by the patient.  Dental Pain Location:  Upper and lower Upper teeth location: Unable to identify. Lower teeth location: Unable to identify. Quality:  Aching Onset quality:  Gradual Duration:  2 days Timing:  Constant Progression:  Unchanged Chronicity:  New Relieved by:  Nothing Worsened by:  Nothing tried Ineffective treatments:  NSAIDs Associated symptoms: no difficulty swallowing, no facial pain, no facial swelling, no fever, no headaches, no neck pain, no neck swelling, no oral bleeding and no trismus     Past Medical History  Diagnosis Date  . Scoliosis   . Bacterial vaginal infection   . Headaches, cluster   . Abscess    Past Surgical History  Procedure Laterality Date  . Wisdom tooth extraction     History reviewed. No pertinent family history. History  Substance Use Topics  . Smoking status: Never Smoker   . Smokeless tobacco: Never Used  . Alcohol Use: No   OB History   Grav Para Term Preterm Abortions TAB SAB Ect Mult Living                 Review of Systems  Constitutional: Negative for fever.  HENT: Negative for facial swelling, sore throat and trouble swallowing.   Gastrointestinal: Negative for nausea.  Musculoskeletal: Negative for neck pain.  Neurological: Negative for dizziness and headaches.  All other systems reviewed and are negative.     Allergies  Bactrim and Penicillins  Home Medications   Prior to Admission medications   Medication Sig Start Date End Date Taking?  Authorizing Provider  Aspirin-Salicylamide-Caffeine (BC HEADACHE POWDER PO) Take 1 packet by mouth daily as needed (for headache).    Historical Provider, MD  doxycycline (VIBRAMYCIN) 100 MG capsule Take 1 capsule (100 mg total) by mouth 2 (two) times daily. One po bid x 14 days 04/06/14   Toy Cookey, MD  ibuprofen (ADVIL,MOTRIN) 600 MG tablet Take 1 tablet (600 mg total) by mouth every 6 (six) hours as needed. 05/29/14   Arman Filter, NP  traMADol (ULTRAM) 50 MG tablet Take 1 tablet (50 mg total) by mouth every 6 (six) hours as needed. 05/29/14   Arman Filter, NP   BP 132/103  Pulse 69  Temp(Src) 97.9 F (36.6 C)  Resp 20  SpO2 100%  LMP 05/29/2014 Physical Exam  Nursing note and vitals reviewed. Constitutional: She is oriented to person, place, and time. She appears well-developed and well-nourished.  HENT:  Head: Normocephalic.  Eyes: Pupils are equal, round, and reactive to light.  Neck: Normal range of motion.  Cardiovascular: Normal rate.   Pulmonary/Chest: Effort normal.  Musculoskeletal: Normal range of motion.  Lymphadenopathy:    She has no cervical adenopathy.  Neurological: She is alert and oriented to person, place, and time.  Skin: Skin is warm.    ED Course  Procedures (including critical care time) Labs Review Labs Reviewed - No data to display  Imaging Review No results found.   EKG Interpretation None      MDM  Final diagnoses:  Pain, dental         Arman Filter, NP 05/29/14 [phone]

## 2014-05-29 NOTE — ED Provider Notes (Signed)
Medical screening examination/treatment/procedure(s) were performed by non-physician practitioner and as supervising physician I was immediately available for consultation/collaboration.   EKG Interpretation None       Natale Barba M Luismario Coston, MD 05/29/14 0525 

## 2014-09-14 ENCOUNTER — Encounter (HOSPITAL_COMMUNITY): Payer: Self-pay | Admitting: Emergency Medicine

## 2014-09-14 ENCOUNTER — Emergency Department (HOSPITAL_COMMUNITY)
Admission: EM | Admit: 2014-09-14 | Discharge: 2014-09-14 | Disposition: A | Discharge status: Home / Self Care | Payer: Medicaid Other | Attending: Emergency Medicine | Admitting: Emergency Medicine

## 2014-09-14 DIAGNOSIS — Z8669 Personal history of other diseases of the nervous system and sense organs: Secondary | ICD-10-CM | POA: Insufficient documentation

## 2014-09-14 DIAGNOSIS — Z8739 Personal history of other diseases of the musculoskeletal system and connective tissue: Secondary | ICD-10-CM | POA: Insufficient documentation

## 2014-09-14 DIAGNOSIS — N898 Other specified noninflammatory disorders of vagina: Secondary | ICD-10-CM | POA: Insufficient documentation

## 2014-09-14 DIAGNOSIS — Z880 Allergy status to penicillin: Secondary | ICD-10-CM | POA: Insufficient documentation

## 2014-09-14 DIAGNOSIS — Z872 Personal history of diseases of the skin and subcutaneous tissue: Secondary | ICD-10-CM | POA: Insufficient documentation

## 2014-09-14 DIAGNOSIS — Z3202 Encounter for pregnancy test, result negative: Secondary | ICD-10-CM | POA: Insufficient documentation

## 2014-09-14 LAB — URINALYSIS, ROUTINE W REFLEX MICROSCOPIC
Bilirubin Urine: NEGATIVE
Glucose, UA: NEGATIVE mg/dL
Hgb urine dipstick: NEGATIVE
Ketones, ur: NEGATIVE mg/dL
Nitrite: NEGATIVE
Protein, ur: NEGATIVE mg/dL
Specific Gravity, Urine: 1.017 (ref 1.005–1.030)
Urobilinogen, UA: 0.2 mg/dL (ref 0.0–1.0)
pH: 6 (ref 5.0–8.0)

## 2014-09-14 LAB — GC/CHLAMYDIA PROBE AMP
CT PROBE, AMP APTIMA: NEGATIVE
GC PROBE AMP APTIMA: NEGATIVE

## 2014-09-14 LAB — WET PREP, GENITAL
Trich, Wet Prep: NONE SEEN
Yeast Wet Prep HPF POC: NONE SEEN

## 2014-09-14 LAB — URINE MICROSCOPIC-ADD ON

## 2014-09-14 LAB — PREGNANCY, URINE: Preg Test, Ur: NEGATIVE

## 2014-09-14 NOTE — Discharge Instructions (Signed)
YOUR LAB STUDIES AND EXAM ARE ESSENTIALLY NORMAL. YOU CAN BE DISCHARGED HOME AND SHOULD FO[facility] FOR ANY RECURRENT SYMPTOMS.

## 2014-09-14 NOTE — ED Notes (Signed)
Pt. reports vaginal discharge onset yesterday , denies dysuria , no fever or chills.

## 2014-09-14 NOTE — ED Provider Notes (Signed)
CSN: [id number]     Arrival date & time 09/14/14  0142 History   First MD Initiated Contact with Patient 09/14/14 0310     Chief Complaint  Patient presents with  . Vaginal Discharge     (Consider location/radiation/quality/duration/timing/severity/associated sxs/prior Treatment) Patient is a 23 y.o. female presenting with vaginal discharge. The history is provided by the patient. No language interpreter was used.  Vaginal Discharge Quality:  Milky Severity:  Moderate Onset quality:  Gradual Associated symptoms: no abdominal pain, no dysuria, no fever, no nausea and no vomiting   Associated symptoms comment:  Vaginal discharge without pain, fever, dysuria or irregular bleeding. She denies abdominal pain.   Past Medical History  Diagnosis Date  . Scoliosis   . Bacterial vaginal infection   . Headaches, cluster   . Abscess    Past Surgical History  Procedure Laterality Date  . Wisdom tooth extraction     No family history on file. History  Substance Use Topics  . Smoking status: Never Smoker   . Smokeless tobacco: Never Used  . Alcohol Use: No   OB History    No data available     Review of Systems  Constitutional: Negative for fever and chills.  Gastrointestinal: Negative.  Negative for nausea, vomiting and abdominal pain.  Genitourinary: Positive for vaginal discharge. Negative for dysuria, vaginal pain and pelvic pain.  Musculoskeletal: Negative.   Skin: Negative.   Neurological: Negative.       Allergies  Bactrim and Penicillins  Home Medications   Prior to Admission medications   Medication Sig Start Date End Date Taking? Authorizing Provider  ibuprofen (ADVIL,MOTRIN) 200 MG tablet Take 200-800 mg by mouth every 6 (six) hours as needed for headache or moderate pain.   Yes Historical Provider, MD  doxycycline (VIBRAMYCIN) 100 MG capsule Take 1 capsule (100 mg total) by mouth 2 (two) times daily. One po bid x 14 days 04/06/14   Toy CookeyMegan Docherty, MD   ibuprofen (ADVIL,MOTRIN) 600 MG tablet Take 1 tablet (600 mg total) by mouth every 6 (six) hours as needed. 05/29/14   Arman FilterGail K Schulz, NP  traMADol (ULTRAM) 50 MG tablet Take 1 tablet (50 mg total) by mouth every 6 (six) hours as needed. 05/29/14   Arman FilterGail K Schulz, NP   BP 101/66 mmHg  Pulse 81  Temp(Src) 98.1 F (36.7 C) (Oral)  Resp 24  Ht 5\' 6"  (1.676 m)  Wt 143 lb (64.864 kg)  BMI 23.09 kg/m2  SpO2 100%  LMP 08/27/2014 Physical Exam  Constitutional: She is oriented to person, place, and time. She appears well-developed and well-nourished.  Neck: Normal range of motion.  Pulmonary/Chest: Effort normal.  Abdominal: Soft. There is no tenderness.  Genitourinary:  White vaginal discharge. No CMT, adnexal mass or tenderness.   Neurological: She is alert and oriented to person, place, and time.  Skin: Skin is warm and dry.    ED Course  Procedures (including critical care time) Labs Review Labs Reviewed  URINALYSIS, ROUTINE W REFLEX MICROSCOPIC - Abnormal; Notable for the following:    APPearance CLOUDY (*)    Leukocytes, UA TRACE (*)    All other components within normal limits  URINE MICROSCOPIC-ADD ON - Abnormal; Notable for the following:    Squamous Epithelial / LPF MANY (*)    Bacteria, UA FEW (*)    All other components within normal limits  WET PREP, GENITAL  GC/CHLAMYDIA PROBE AMP  PREGNANCY, URINE   Results for orders placed or performed  during the hospital encounter of 09/14/14  Wet prep, genital  Result Value Ref Range   Yeast Wet Prep HPF POC NONE SEEN NONE SEEN   Trich, Wet Prep NONE SEEN NONE SEEN   Clue Cells Wet Prep HPF POC FEW (A) NONE SEEN   WBC, Wet Prep HPF POC FEW (A) NONE SEEN  Urinalysis, Routine w reflex microscopic  Result Value Ref Range   Color, Urine YELLOW YELLOW   APPearance CLOUDY (A) CLEAR   Specific Gravity, Urine 1.017 1.005 - 1.030   pH 6.0 5.0 - 8.0   Glucose, UA NEGATIVE NEGATIVE mg/dL   Hgb urine dipstick NEGATIVE NEGATIVE    Bilirubin Urine NEGATIVE NEGATIVE   Ketones, ur NEGATIVE NEGATIVE mg/dL   Protein, ur NEGATIVE NEGATIVE mg/dL   Urobilinogen, UA 0.2 0.0 - 1.0 mg/dL   Nitrite NEGATIVE NEGATIVE   Leukocytes, UA TRACE (A) NEGATIVE  Pregnancy, urine  Result Value Ref Range   Preg Test, Ur NEGATIVE NEGATIVE  Urine microscopic-add on  Result Value Ref Range   Squamous Epithelial / LPF MANY (A) RARE   WBC, UA 3-6 <3 WBC/hpf   RBC / HPF 0-2 <3 RBC/hpf   Bacteria, UA FEW (A) RARE   Urine-Other MUCOUS PRESENT     Imaging Review No results found.   EKG Interpretation None      MDM   Final diagnoses:  None    1. Vaginal discharge  She has no pelvic tenderness, no purulent discharge, and an unremarkable wet prep - doubt acute infection. Suspect normal physiologic discharge. Cultures pending.   Arnoldo HookerShari A Mishal Probert, PA-C 09/14/14 [id number]  Derwood KaplanAnkit Nanavati, MD 09/14/14 [phone]

## 2014-11-09 ENCOUNTER — Encounter (HOSPITAL_COMMUNITY): Payer: Self-pay | Admitting: Emergency Medicine

## 2014-11-09 ENCOUNTER — Emergency Department (HOSPITAL_COMMUNITY)
Admission: EM | Admit: 2014-11-09 | Discharge: 2014-11-09 | Disposition: A | Discharge status: Home / Self Care | Payer: Medicaid Other | Attending: Emergency Medicine | Admitting: Emergency Medicine

## 2014-11-09 DIAGNOSIS — Z880 Allergy status to penicillin: Secondary | ICD-10-CM | POA: Insufficient documentation

## 2014-11-09 DIAGNOSIS — M419 Scoliosis, unspecified: Secondary | ICD-10-CM | POA: Insufficient documentation

## 2014-11-09 DIAGNOSIS — Z8742 Personal history of other diseases of the female genital tract: Secondary | ICD-10-CM | POA: Insufficient documentation

## 2014-11-09 DIAGNOSIS — Z8669 Personal history of other diseases of the nervous system and sense organs: Secondary | ICD-10-CM | POA: Insufficient documentation

## 2014-11-09 DIAGNOSIS — J069 Acute upper respiratory infection, unspecified: Secondary | ICD-10-CM | POA: Insufficient documentation

## 2014-11-09 DIAGNOSIS — Z872 Personal history of diseases of the skin and subcutaneous tissue: Secondary | ICD-10-CM | POA: Insufficient documentation

## 2014-11-09 DIAGNOSIS — Z79899 Other long term (current) drug therapy: Secondary | ICD-10-CM | POA: Insufficient documentation

## 2014-11-09 DIAGNOSIS — Z792 Long term (current) use of antibiotics: Secondary | ICD-10-CM | POA: Insufficient documentation

## 2014-11-09 DIAGNOSIS — G43809 Other migraine, not intractable, without status migrainosus: Secondary | ICD-10-CM

## 2014-11-09 MED ORDER — SODIUM CHLORIDE 0.9 % IV BOLUS (SEPSIS)
1000.0000 mL | Freq: Once | INTRAVENOUS | Status: AC
  Administered 2014-11-09: 1000 mL via INTRAVENOUS

## 2014-11-09 MED ORDER — DIPHENHYDRAMINE HCL 50 MG/ML IJ SOLN
12.5000 mg | Freq: Once | INTRAMUSCULAR | Status: AC
  Administered 2014-11-09: 12.5 mg via INTRAVENOUS
  Filled 2014-11-09: qty 1

## 2014-11-09 MED ORDER — ONDANSETRON HCL 4 MG PO TABS: 4.0000 mg | ORAL_TABLET | Freq: Four times a day (QID) | ORAL | Status: DC

## 2014-11-09 MED ORDER — METOCLOPRAMIDE HCL 5 MG/ML IJ SOLN
10.0000 mg | Freq: Once | INTRAMUSCULAR | Status: AC
  Administered 2014-11-09: 10 mg via INTRAVENOUS
  Filled 2014-11-09: qty 2

## 2014-11-09 MED ORDER — KETOROLAC TROMETHAMINE 30 MG/ML IJ SOLN
30.0000 mg | Freq: Once | INTRAMUSCULAR | Status: AC
  Administered 2014-11-09: 30 mg via INTRAVENOUS
  Filled 2014-11-09: qty 1

## 2014-11-09 NOTE — ED Notes (Signed)
Pt is c/o cough, headache, and generalized body aches for 3 days

## 2014-11-09 NOTE — Discharge Instructions (Signed)
Upper Respiratory Infection, Adult °An upper respiratory infection (URI) is also sometimes known as the common cold. The upper respiratory tract includes the nose, sinuses, throat, trachea, and bronchi. Bronchi are the airways leading to the lungs. Most people improve within 1 week, but symptoms can last up to 2 weeks. A residual cough may last even longer.  °CAUSES °Many different viruses can infect the tissues lining the upper respiratory tract. The tissues become irritated and inflamed and often become very moist. Mucus production is also common. A cold is contagious. You can easily spread the virus to others by oral contact. This includes kissing, sharing a glass, coughing, or sneezing. Touching your mouth or nose and then touching a surface, which is then touched by another person, can also spread the virus. °SYMPTOMS  °Symptoms typically develop 1 to 3 days after you come in contact with a cold virus. Symptoms vary from person to person. They may include: °· Runny nose. °· Sneezing. °· Nasal congestion. °· Sinus irritation. °· Sore throat. °· Loss of voice (laryngitis). °· Cough. °· Fatigue. °· Muscle aches. °· Loss of appetite. °· Headache. °· Low-grade fever. °DIAGNOSIS  °You might diagnose your own cold based on familiar symptoms, since most people get a cold 2 to 3 times a year. Your caregiver can confirm this based on your exam. Most importantly, your caregiver can check that your symptoms are not due to another disease such as strep throat, sinusitis, pneumonia, asthma, or epiglottitis. Blood tests, throat tests, and X-rays are not necessary to diagnose a common cold, but they may sometimes be helpful in excluding other more serious diseases. Your caregiver will decide if any further tests are required. °RISKS AND COMPLICATIONS  °You may be at risk for a more severe case of the common cold if you smoke cigarettes, have chronic heart disease (such as heart failure) or lung disease (such as asthma), or if  you have a weakened immune system. The very young and very old are also at risk for more serious infections. Bacterial sinusitis, middle ear infections, and bacterial pneumonia can complicate the common cold. The common cold can worsen asthma and chronic obstructive pulmonary disease (COPD). Sometimes, these complications can require emergency medical care and may be life-threatening. °PREVENTION  °The best way to protect against getting a cold is to practice good hygiene. Avoid oral or hand contact with people with cold symptoms. Wash your hands often if contact occurs. There is no clear evidence that vitamin C, vitamin E, echinacea, or exercise reduces the chance of developing a cold. However, it is always recommended to get plenty of rest and practice good nutrition. °TREATMENT  °Treatment is directed at relieving symptoms. There is no cure. Antibiotics are not effective, because the infection is caused by a virus, not by bacteria. Treatment may include: °· Increased fluid intake. Sports drinks offer valuable electrolytes, sugars, and fluids. °· Breathing heated mist or steam (vaporizer or shower). °· Eating chicken soup or other clear broths, and maintaining good nutrition. °· Getting plenty of rest. °· Using gargles or lozenges for comfort. °· Controlling fevers with ibuprofen or acetaminophen as directed by your caregiver. °· Increasing usage of your inhaler if you have asthma. °Zinc gel and zinc lozenges, taken in the first 24 hours of the common cold, can shorten the duration and lessen the severity of symptoms. Pain medicines may help with fever, muscle aches, and throat pain. A variety of non-prescription medicines are available to treat congestion and runny nose. Your caregiver   can make recommendations and may suggest nasal or lung inhalers for other symptoms.  HOME CARE INSTRUCTIONS   Only take over-the-counter or prescription medicines for pain, discomfort, or fever as directed by your  caregiver.  Use a warm mist humidifier or inhale steam from a shower to increase air moisture. This may keep secretions moist and make it easier to breathe.  Drink enough water and fluids to keep your urine clear or pale yellow.  Rest as needed.  Return to work when your temperature has returned to normal or as your caregiver advises. You may need to stay home longer to avoid infecting others. You can also use a face mask and careful hand washing to prevent spread of the virus. SEEK MEDICAL CARE IF:   After the first few days, you feel you are getting worse rather than better.  You need your caregiver's advice about medicines to control symptoms.  You develop chills, worsening shortness of breath, or brown or red sputum. These may be signs of pneumonia.  You develop yellow or brown nasal discharge or pain in the face, especially when you bend forward. These may be signs of sinusitis.  You develop a fever, swollen neck glands, pain with swallowing, or white areas in the back of your throat. These may be signs of strep throat. SEEK IMMEDIATE MEDICAL CARE IF:   You have a fever.  You develop severe or persistent headache, ear pain, sinus pain, or chest pain.  You develop wheezing, a prolonged cough, cough up blood, or have a change in your usual mucus (if you have chronic lung disease).  You develop sore muscles or a stiff neck. Document Released: 03/11/2001 Document Revised: 12/08/2011 Document Reviewed: 12/21/2013 [facility] Patient Information 2015 [place], [place]. This information is not intended to replace advice given to you by your health care provider. Make sure you discuss any questions you have with your health care provider. Migraine Headache A migraine headache is an intense, throbbing pain on one or both sides of your head. A migraine can last for 30 minutes to several hours. CAUSES  The exact cause of a migraine headache is not always known. However, a migraine may be  caused when nerves in the brain become irritated and release chemicals that cause inflammation. This causes pain. Certain things may also trigger migraines, such as:  Alcohol.  Smoking.  Stress.  Menstruation.  Aged cheeses.  Foods or drinks that contain nitrates, glutamate, aspartame, or tyramine.  Lack of sleep.  Chocolate.  Caffeine.  Hunger.  Physical exertion.  Fatigue.  Medicines used to treat chest pain (nitroglycerine), birth control pills, estrogen, and some blood pressure medicines. SIGNS AND SYMPTOMS  Pain on one or both sides of your head.  Pulsating or throbbing pain.  Severe pain that prevents daily activities.  Pain that is aggravated by any physical activity.  Nausea, vomiting, or both.  Dizziness.  Pain with exposure to bright lights, loud noises, or activity.  General sensitivity to bright lights, loud noises, or smells. Before you get a migraine, you may get warning signs that a migraine is coming (aura). An aura may include:  Seeing flashing lights.  Seeing bright spots, halos, or zigzag lines.  Having tunnel vision or blurred vision.  Having feelings of numbness or tingling.  Having trouble talking.  Having muscle weakness. DIAGNOSIS  A migraine headache is often diagnosed based on:  Symptoms.  Physical exam.  A CT scan or MRI of your head. These imaging tests cannot diagnose migraines,  but they can help rule out other causes of headaches. TREATMENT Medicines may be given for pain and nausea. Medicines can also be given to help prevent recurrent migraines.  HOME CARE INSTRUCTIONS  Only take over-the-counter or prescription medicines for pain or discomfort as directed by your health care provider. The use of long-term narcotics is not recommended.  Lie down in a dark, quiet room when you have a migraine.  Keep a journal to find out what may trigger your migraine headaches. For example, write down:  What you eat and  drink.  How much sleep you get.  Any change to your diet or medicines.  Limit alcohol consumption.  Quit smoking if you smoke.  Get 7-9 hours of sleep, or as recommended by your health care provider.  Limit stress.  Keep lights dim if bright lights bother you and make your migraines worse. SEEK IMMEDIATE MEDICAL CARE IF:   Your migraine becomes severe.  You have a fever.  You have a stiff neck.  You have vision loss.  You have muscular weakness or loss of muscle control.  You start losing your balance or have trouble walking.  You feel faint or pass out.  You have severe symptoms that are different from your first symptoms. MAKE SURE YOU:   Understand these instructions.  Will watch your condition.  Will get help right away if you are not doing well or get worse. Document Released: 09/15/2005 Document Revised: 01/30/2014 Document Reviewed: 05/23/2013 [facility]tonExitCare Patient Information 2015 [place]ExitCare, [place]LL[place]. This information is not intended to replace advice given to you by your health care provider. Make sure you discuss any questions you have with your health care provider.

## 2014-11-09 NOTE — ED Provider Notes (Signed)
CSN: [id number]     Arrival date & time 11/09/14  0257 History   First MD Initiated Contact with Patient 11/09/14 [phone]     Chief Complaint  Patient presents with  . Headache  . Cough     (Consider location/radiation/quality/duration/timing/severity/associated sxs/prior Treatment) Patient is a 24 y.o. female presenting with headaches and cough. The history is provided by the patient. No language interpreter was used.  Headache Pain location:  Frontal Associated symptoms: congestion, cough, myalgias, photophobia and sinus pressure   Associated symptoms: no abdominal pain, no fever and no nausea   Associated symptoms comment:  She reports symptoms of generalized body aches, headache, congestion, cough for the past 3-4 days. No known fever. She states the headache has become worse and is like previous migraine headaches. No vomiting, diarrhea, dysuria. Cough Associated symptoms: headaches and myalgias   Associated symptoms: no chills, no fever and no rash     Past Medical History  Diagnosis Date  . Scoliosis   . Bacterial vaginal infection   . Headaches, cluster   . Abscess    Past Surgical History  Procedure Laterality Date  . Wisdom tooth extraction     Family History  Problem Relation Age of Onset  . Diabetes Other   . Hypertension Other    History  Substance Use Topics  . Smoking status: Never Smoker   . Smokeless tobacco: Never Used  . Alcohol Use: No   OB History    No data available     Review of Systems  Constitutional: Negative for fever and chills.  HENT: Positive for congestion and sinus pressure.   Eyes: Positive for photophobia.  Respiratory: Positive for cough.   Cardiovascular: Negative.   Gastrointestinal: Negative.  Negative for nausea and abdominal pain.  Genitourinary: Negative for dysuria.  Musculoskeletal: Positive for myalgias.  Skin: Negative.  Negative for rash.  Neurological: Positive for headaches.      Allergies  Bactrim and  Penicillins  Home Medications   Prior to Admission medications   Medication Sig Start Date End Date Taking? Authorizing Provider  Aspirin-Salicylamide-Caffeine (BC HEADACHE POWDER PO) Take 1 each by mouth daily.   Yes Historical Provider, MD  ibuprofen (ADVIL,MOTRIN) 200 MG tablet Take 200-800 mg by mouth every 6 (six) hours as needed for headache or moderate pain.   Yes Historical Provider, MD  doxycycline (VIBRAMYCIN) 100 MG capsule Take 1 capsule (100 mg total) by mouth 2 (two) times daily. One po bid x 14 days Patient not taking: Reported on 11/09/2014 04/06/14   Toy Cookey, MD  ibuprofen (ADVIL,MOTRIN) 600 MG tablet Take 1 tablet (600 mg total) by mouth every 6 (six) hours as needed. Patient not taking: Reported on 11/09/2014 05/29/14   Arman Filter, NP  traMADol (ULTRAM) 50 MG tablet Take 1 tablet (50 mg total) by mouth every 6 (six) hours as needed. Patient not taking: Reported on 11/09/2014 05/29/14   Arman Filter, NP   BP 137/85 mmHg  Pulse 95  Temp(Src) 98.5 F (36.9 C) (Oral)  Resp 18  SpO2 99%  LMP 10/30/2014 (Approximate) Physical Exam  Constitutional: She is oriented to person, place, and time. She appears well-developed and well-nourished.  HENT:  Head: Normocephalic.  Right Ear: Tympanic membrane normal.  Left Ear: Tympanic membrane normal.  Nose: Mucosal edema present. Right sinus exhibits no maxillary sinus tenderness and no frontal sinus tenderness. Left sinus exhibits no maxillary sinus tenderness and no frontal sinus tenderness.  Mouth/Throat: Uvula is midline. Mucous membranes  are not dry. No oropharyngeal exudate.  Eyes: Pupils are equal, round, and reactive to light.  Neck: Normal range of motion. Neck supple.  Cardiovascular: Normal rate and regular rhythm.   Pulmonary/Chest: Effort normal and breath sounds normal.  Abdominal: Soft. Bowel sounds are normal. There is no tenderness. There is no rebound and no guarding.  Musculoskeletal: Normal range of motion.   Neurological: She is alert and oriented to person, place, and time. She has normal strength. No sensory deficit. She displays a negative Romberg sign. Coordination normal.  No coordination deficits. Speech clear and focused. CN's 3-12 grossly intact.   Skin: Skin is warm and dry. No rash noted.  Psychiatric: She has a normal mood and affect.    ED Course  Procedures (including critical care time) Labs Review Labs Reviewed - No data to display  Imaging Review No results found.   EKG Interpretation None      MDM   Final diagnoses:  None    1. Migraine headache 2. Viral URI  Headache resolved with medications. She is tolerating PO fluids and reports feeling better. Will discharge home, symptomatic care.    Arnoldo HookerShari A Avalina Benko, PA-C 11/09/14 [id number]  Loren Raceravid Yelverton, MD 11/09/14 [phone]

## 2015-01-27 ENCOUNTER — Emergency Department (HOSPITAL_COMMUNITY)
Admission: EM | Admit: 2015-01-27 | Discharge: 2015-01-27 | Disposition: A | Discharge status: Home / Self Care | Payer: Medicaid Other | Attending: Emergency Medicine | Admitting: Emergency Medicine

## 2015-01-27 ENCOUNTER — Encounter (HOSPITAL_COMMUNITY): Payer: Self-pay | Admitting: Emergency Medicine

## 2015-01-27 DIAGNOSIS — R112 Nausea with vomiting, unspecified: Secondary | ICD-10-CM | POA: Insufficient documentation

## 2015-01-27 DIAGNOSIS — Z8669 Personal history of other diseases of the nervous system and sense organs: Secondary | ICD-10-CM | POA: Insufficient documentation

## 2015-01-27 DIAGNOSIS — R0789 Other chest pain: Secondary | ICD-10-CM | POA: Insufficient documentation

## 2015-01-27 DIAGNOSIS — Z79899 Other long term (current) drug therapy: Secondary | ICD-10-CM | POA: Insufficient documentation

## 2015-01-27 DIAGNOSIS — Z8742 Personal history of other diseases of the female genital tract: Secondary | ICD-10-CM | POA: Insufficient documentation

## 2015-01-27 DIAGNOSIS — R51 Headache: Secondary | ICD-10-CM | POA: Insufficient documentation

## 2015-01-27 DIAGNOSIS — Z3202 Encounter for pregnancy test, result negative: Secondary | ICD-10-CM | POA: Insufficient documentation

## 2015-01-27 DIAGNOSIS — R110 Nausea: Secondary | ICD-10-CM

## 2015-01-27 DIAGNOSIS — Z880 Allergy status to penicillin: Secondary | ICD-10-CM | POA: Insufficient documentation

## 2015-01-27 DIAGNOSIS — Z872 Personal history of diseases of the skin and subcutaneous tissue: Secondary | ICD-10-CM | POA: Insufficient documentation

## 2015-01-27 DIAGNOSIS — Z792 Long term (current) use of antibiotics: Secondary | ICD-10-CM | POA: Insufficient documentation

## 2015-01-27 LAB — CBC WITH DIFFERENTIAL/PLATELET
BASOS ABS: 0 10*3/uL (ref 0.0–0.1)
BASOS PCT: 0 % (ref 0–1)
EOS ABS: 0.1 10*3/uL (ref 0.0–0.7)
Eosinophils Relative: 0 % (ref 0–5)
HCT: 36.6 % (ref 36.0–46.0)
Hemoglobin: 12.4 g/dL (ref 12.0–15.0)
Lymphocytes Relative: 33 % (ref 12–46)
Lymphs Abs: 3.7 10*3/uL (ref 0.7–4.0)
MCH: 25.5 pg — ABNORMAL LOW (ref 26.0–34.0)
MCHC: 33.9 g/dL (ref 30.0–36.0)
MCV: 75.3 fL — AB (ref 78.0–100.0)
MONO ABS: 0.9 10*3/uL (ref 0.1–1.0)
Monocytes Relative: 8 % (ref 3–12)
NEUTROS ABS: 6.6 10*3/uL (ref 1.7–7.7)
NEUTROS PCT: 59 % (ref 43–77)
PLATELETS: 288 10*3/uL (ref 150–400)
RBC: 4.86 MIL/uL (ref 3.87–5.11)
RDW: 13.9 % (ref 11.5–15.5)
WBC: 11.3 10*3/uL — ABNORMAL HIGH (ref 4.0–10.5)

## 2015-01-27 LAB — URINALYSIS, ROUTINE W REFLEX MICROSCOPIC
BILIRUBIN URINE: NEGATIVE
GLUCOSE, UA: NEGATIVE mg/dL
HGB URINE DIPSTICK: NEGATIVE
KETONES UR: NEGATIVE mg/dL
Leukocytes, UA: NEGATIVE
Nitrite: NEGATIVE
PH: 7 (ref 5.0–8.0)
Protein, ur: NEGATIVE mg/dL
SPECIFIC GRAVITY, URINE: 1.02 (ref 1.005–1.030)
Urobilinogen, UA: 0.2 mg/dL (ref 0.0–1.0)

## 2015-01-27 LAB — COMPREHENSIVE METABOLIC PANEL
ALK PHOS: 50 U/L (ref 39–117)
ALT: 15 U/L (ref 0–35)
AST: 22 U/L (ref 0–37)
Albumin: 4.3 g/dL (ref 3.5–5.2)
Anion gap: 9 (ref 5–15)
BILIRUBIN TOTAL: 0.9 mg/dL (ref 0.3–1.2)
BUN: 8 mg/dL (ref 6–23)
CO2: 27 mmol/L (ref 19–32)
Calcium: 9.2 mg/dL (ref 8.4–10.5)
Chloride: 103 mmol/L (ref 96–112)
Creatinine, Ser: 0.68 mg/dL (ref 0.50–1.10)
GFR calc Af Amer: >90 mL/min (ref >90)
GFR calc non Af Amer: >90 mL/min (ref >90)
GLUCOSE: 81 mg/dL (ref 70–99)
POTASSIUM: 3.6 mmol/L (ref 3.5–5.1)
Sodium: 139 mmol/L (ref 135–145)
Total Protein: 7.9 g/dL (ref 6.0–8.3)

## 2015-01-27 LAB — POC URINE PREG, ED: PREG TEST UR: NEGATIVE

## 2015-01-27 MED ORDER — ONDANSETRON 4 MG PO TBDP
4.0000 mg | ORAL_TABLET | Freq: Once | ORAL | Status: AC
  Administered 2015-01-27: 4 mg via ORAL
  Filled 2015-01-27: qty 1

## 2015-01-27 MED ORDER — ONDANSETRON HCL 4 MG PO TABS: 4.0000 mg | ORAL_TABLET | Freq: Four times a day (QID) | ORAL | Status: DC

## 2015-01-27 MED ORDER — ACETAMINOPHEN 325 MG PO TABS
650.0000 mg | ORAL_TABLET | Freq: Once | ORAL | Status: AC
  Administered 2015-01-27: 650 mg via ORAL
  Filled 2015-01-27: qty 2

## 2015-01-27 NOTE — ED Notes (Signed)
Nurse is going to start a IV. 

## 2015-01-27 NOTE — ED Notes (Signed)
Per pt, states she started vomiting last night-states chest discomfort from vomiting-has not vomited today

## 2015-01-27 NOTE — Discharge Instructions (Signed)
Nausea and Vomiting °Nausea is a sick feeling that often comes before throwing up (vomiting). Vomiting is a reflex where stomach contents come out of your mouth. Vomiting can cause severe loss of body fluids (dehydration). Children and elderly adults can become dehydrated quickly, especially if they also have diarrhea. Nausea and vomiting are symptoms of a condition or disease. It is important to find the cause of your symptoms. °CAUSES  °· Direct irritation of the stomach lining. This irritation can result from increased acid production (gastroesophageal reflux disease), infection, food poisoning, taking certain medicines (such as nonsteroidal anti-inflammatory drugs), alcohol use, or tobacco use. °· Signals from the brain. These signals could be caused by a headache, heat exposure, an inner ear disturbance, increased pressure in the brain from injury, infection, a tumor, or a concussion, pain, emotional stimulus, or metabolic problems. °· An obstruction in the gastrointestinal tract (bowel obstruction). °· Illnesses such as diabetes, hepatitis, gallbladder problems, appendicitis, kidney problems, cancer, sepsis, atypical symptoms of a heart attack, or eating disorders. °· Medical treatments such as chemotherapy and radiation. °· Receiving medicine that makes you sleep (general anesthetic) during surgery. °DIAGNOSIS °Your caregiver may ask for tests to be done if the problems do not improve after a few days. Tests may also be done if symptoms are severe or if the reason for the nausea and vomiting is not clear. Tests may include: °· Urine tests. °· Blood tests. °· Stool tests. °· Cultures (to look for evidence of infection). °· X-rays or other imaging studies. °Test results can help your caregiver make decisions about treatment or the need for additional tests. °TREATMENT °You need to stay well hydrated. Drink frequently but in small amounts. You may wish to drink water, sports drinks, clear broth, or eat frozen  ice pops or gelatin dessert to help stay hydrated. When you eat, eating slowly may help prevent nausea. There are also some antinausea medicines that may help prevent nausea. °HOME CARE INSTRUCTIONS  °· Take all medicine as directed by your caregiver. °· If you do not have an appetite, do not force yourself to eat. However, you must continue to drink fluids. °· If you have an appetite, eat a normal diet unless your caregiver tells you differently. °¨ Eat a variety of complex carbohydrates (rice, wheat, potatoes, bread), lean meats, yogurt, fruits, and vegetables. °¨ Avoid high-fat foods because they are more difficult to digest. °· Drink enough water and fluids to keep your urine clear or pale yellow. °· If you are dehydrated, ask your caregiver for specific rehydration instructions. Signs of dehydration may include: °¨ Severe thirst. °¨ Dry lips and mouth. °¨ Dizziness. °¨ Dark urine. °¨ Decreasing urine frequency and amount. °¨ Confusion. °¨ Rapid breathing or pulse. °SEEK IMMEDIATE MEDICAL CARE IF:  °· You have blood or brown flecks (like coffee grounds) in your vomit. °· You have black or bloody stools. °· You have a severe headache or stiff neck. °· You are confused. °· You have severe abdominal pain. °· You have chest pain or trouble breathing. °· You do not urinate at least once every 8 hours. °· You develop cold or clammy skin. °· You continue to vomit for longer than 24 to 48 hours. °· You have a fever. °MAKE SURE YOU:  °· Understand these instructions. °· Will watch your condition. °· Will get help right away if you are not doing well or get worse. °Document Released: 09/15/2005 Document Revised: 12/08/2011 Document Reviewed: 02/12/2011 °Exit[employer]® Patient Information ©2015 [employer]. This information is not intended   to replace advice given to you by your health care provider. Make sure you discuss any questions you have with your health care provider.  Please monitor for worsening signs or symptoms,  use Tylenol or ibuprofen as needed for pain please follow-up with [facility] if symptoms do not improve.

## 2015-01-27 NOTE — ED Notes (Addendum)
Awake. Verbally responsive. Resp even and unlabored. No audible adventitious breath sounds noted. ABC's intact. Abd soft/nondistended but tender to palpate. BS (+) and active x4 quadrants. Pt reported havingf nausea x3 in 24hrs and no diarrhea. Pt denies urinary frequency/urgency, dysuria, and burning with void but has lower back pain and pressure with void. LBM was 01/26/2015.

## 2015-01-27 NOTE — ED Notes (Signed)
Awake. Verbally responsive. A/O x4. Resp even and unlabored. No audible adventitious breath sounds noted. ABC's intact.  

## 2015-01-27 NOTE — ED Notes (Signed)
Awake. Verbally responsive. Resp even and unlabored. No audible adventitious breath sounds noted. ABC's intact. Abd soft/nondistended but tender to palpate. BS (+) and active x4 quadrants. No N/V/D reported. 

## 2015-01-27 NOTE — ED Provider Notes (Signed)
CSN: [id number]     Arrival date & time 01/27/15  1402 History   First MD Initiated Contact with Patient 01/27/15 1507     Chief Complaint  Patient presents with  . Emesis   HPI   24 year old female presents today with nausea vomiting. Patient reports yesterday she experienced episodes of nausea and vomiting starting late afternoon, not associated with food. She reports approximately 3 episodes throughout the evening and this of nonbloody emesis. She reports associated chest tightness and pain associated with vomiting, describes it as "soreness", made worse with palpation. Pain is located in the lower substernal/ribs, no radiation of pain, not associated with shortness of breath, diaphoresis, or palpitations. Patient states that she also has associated headache, describes it as frontal with no associated neurological deficits or concerning signs or symptoms. Patient states that she attempted using BC powder for headache relief yesterday but vomited after taking it. She notes that she has not had any episodes of vomiting today, but still complains of chest soreness from vomiting. Patient denies neck stiffness, upper respiratory symptoms including cough, shortness of breath, palpitations, abdominal pain, changes in bowel or bladder characteristics or frequency, lower extremity swelling or edema. Patient reports that she does not take medication at home, no history of cardiac disease, does not smoke, or use drugs.  Past Medical History  Diagnosis Date  . Scoliosis   . Bacterial vaginal infection   . Headaches, cluster   . Abscess    Past Surgical History  Procedure Laterality Date  . Wisdom tooth extraction     Family History  Problem Relation Age of Onset  . Diabetes Other   . Hypertension Other    History  Substance Use Topics  . Smoking status: Never Smoker   . Smokeless tobacco: Never Used  . Alcohol Use: No   OB History    No data available     Review of Systems  All other  systems reviewed and are negative.   Allergies  Bactrim and Penicillins  Home Medications   Prior to Admission medications   Medication Sig Start Date End Date Taking? Authorizing Provider  Aspirin-Salicylamide-Caffeine (BC HEADACHE POWDER PO) Take 1 each by mouth daily.   Yes Historical Provider, MD  doxycycline (VIBRAMYCIN) 100 MG capsule Take 1 capsule (100 mg total) by mouth 2 (two) times daily. One po bid x 14 days Patient not taking: Reported on 11/09/2014 04/06/14   Toy CookeyMegan Docherty, MD  ibuprofen (ADVIL,MOTRIN) 600 MG tablet Take 1 tablet (600 mg total) by mouth every 6 (six) hours as needed. Patient not taking: Reported on 11/09/2014 05/29/14   Earley FavorGail Schulz, NP  ondansetron (ZOFRAN) 4 MG tablet Take 1 tablet (4 mg total) by mouth every 6 (six) hours. 01/27/15   Eyvonne MechanicJeffrey Prayan Ulin, PA-C  traMADol (ULTRAM) 50 MG tablet Take 1 tablet (50 mg total) by mouth every 6 (six) hours as needed. Patient not taking: Reported on 11/09/2014 05/29/14   Earley FavorGail Schulz, NP   BP 140/92 mmHg  Pulse 65  Temp(Src) 98.1 F (36.7 C) (Oral)  Resp 16  SpO2 100%  LMP 01/02/2015 Physical Exam  Constitutional: She is oriented to person, place, and time. She appears well-developed and well-nourished.  HENT:  Head: Normocephalic and atraumatic.  Eyes: Pupils are equal, round, and reactive to light.  Neck: Normal range of motion. Neck supple. No JVD present. No tracheal deviation present. No thyromegaly present.  Cardiovascular: Normal rate, regular rhythm, normal heart sounds and intact distal pulses.  Exam reveals no  gallop and no friction rub.   No murmur heard. Pulmonary/Chest: Effort normal and breath sounds normal. No stridor. No respiratory distress. She has no wheezes. She has no rales. She exhibits no tenderness.  Mild tenderness to palpation of anterior chest wall  Abdominal: Soft. Bowel sounds are normal. There is no tenderness. There is no rebound.  Musculoskeletal: Normal range of motion.  Lymphadenopathy:     She has no cervical adenopathy.  Neurological: She is alert and oriented to person, place, and time. She has normal strength. No cranial nerve deficit or sensory deficit. Coordination normal.  Skin: Skin is warm and dry.  Psychiatric: She has a normal mood and affect. Her behavior is normal. Judgment and thought content normal.  Nursing note and vitals reviewed.   ED Course  Procedures (including critical care time) Labs Review Labs Reviewed  URINALYSIS, ROUTINE W REFLEX MICROSCOPIC - Abnormal; Notable for the following:    APPearance CLOUDY (*)    All other components within normal limits  CBC WITH DIFFERENTIAL/PLATELET - Abnormal; Notable for the following:    WBC 11.3 (*)    MCV 75.3 (*)    MCH 25.5 (*)    All other components within normal limits  COMPREHENSIVE METABOLIC PANEL  POC URINE PREG, ED    Imaging Review No results found.   EKG Interpretation None      MDM   Final diagnoses:  Nausea    Labs: CBC, CMP, urine pregnancy, urinalysis- no significant findings  Imaging: None indicated  Consults: None indicated  Therapeutics: Zofran, Tylenol  Assessment: Nausea vomiting  Plan: Since with nausea and vomiting started yesterday, no episodes today. She reported a headache and chest soreness from vomiting. She was tender to palpation of the chest, neurological exam benign. No significant risk factors for cardiopulmonary disease, this is unlikely related. Patient was given Zofran and Tylenol, and reports near complete resolution of chest soreness and headache. Patient was instructed to use Tylenol or ibuprofen as needed for pain, Zofran for nausea, and monitor for worsening signs or symptoms. The event symptoms worsen or continue she is advised to follow-up for further evaluation and management. Patient understood and agreed to today's plan and had no further questions or concerns at time of discharge.      Eyvonne Mechanic, PA-C 01/29/15 1438  Azalia Bilis,  MD 01/30/15 [phone]

## 2015-10-02 ENCOUNTER — Encounter (HOSPITAL_COMMUNITY): Payer: Self-pay | Admitting: Emergency Medicine

## 2015-10-02 ENCOUNTER — Emergency Department (HOSPITAL_COMMUNITY)
Admission: EM | Admit: 2015-10-02 | Discharge: 2015-10-02 | Disposition: A | Discharge status: Home / Self Care | Payer: Medicaid Other | Attending: Emergency Medicine | Admitting: Emergency Medicine

## 2015-10-02 DIAGNOSIS — N611 Abscess of the breast and nipple: Secondary | ICD-10-CM

## 2015-10-02 DIAGNOSIS — M419 Scoliosis, unspecified: Secondary | ICD-10-CM | POA: Insufficient documentation

## 2015-10-02 DIAGNOSIS — Z8742 Personal history of other diseases of the female genital tract: Secondary | ICD-10-CM | POA: Insufficient documentation

## 2015-10-02 DIAGNOSIS — Z8669 Personal history of other diseases of the nervous system and sense organs: Secondary | ICD-10-CM | POA: Insufficient documentation

## 2015-10-02 DIAGNOSIS — Z880 Allergy status to penicillin: Secondary | ICD-10-CM | POA: Insufficient documentation

## 2015-10-02 DIAGNOSIS — Z23 Encounter for immunization: Secondary | ICD-10-CM | POA: Insufficient documentation

## 2015-10-02 MED ORDER — IBUPROFEN 800 MG PO TABS: 800.0000 mg | ORAL_TABLET | Freq: Three times a day (TID) | ORAL | Status: DC

## 2015-10-02 MED ORDER — TETANUS-DIPHTH-ACELL PERTUSSIS 5-2.5-18.5 LF-MCG/0.5 IM SUSP
0.5000 mL | Freq: Once | INTRAMUSCULAR | Status: AC
  Administered 2015-10-02: 0.5 mL via INTRAMUSCULAR
  Filled 2015-10-02: qty 0.5

## 2015-10-02 MED ORDER — FLUCONAZOLE 100 MG PO TABS
150.0000 mg | ORAL_TABLET | Freq: Once | ORAL | Status: AC
  Administered 2015-10-02: 150 mg via ORAL
  Filled 2015-10-02 (×2): qty 2

## 2015-10-02 MED ORDER — DOXYCYCLINE HYCLATE 100 MG PO TABS
100.0000 mg | ORAL_TABLET | Freq: Once | ORAL | Status: AC
  Administered 2015-10-02: 100 mg via ORAL
  Filled 2015-10-02: qty 1

## 2015-10-02 MED ORDER — OXYCODONE-ACETAMINOPHEN 5-325 MG PO TABS
2.0000 | ORAL_TABLET | Freq: Once | ORAL | Status: AC
  Administered 2015-10-02: 2 via ORAL
  Filled 2015-10-02: qty 2

## 2015-10-02 MED ORDER — BUPIVACAINE HCL (PF) 0.5 % IJ SOLN
10.0000 mL | Freq: Once | INTRAMUSCULAR | Status: AC
  Administered 2015-10-02: 10 mL
  Filled 2015-10-02: qty 10

## 2015-10-02 MED ORDER — DOXYCYCLINE HYCLATE 100 MG PO CAPS: 100.0000 mg | ORAL_CAPSULE | Freq: Two times a day (BID) | ORAL | Status: DC

## 2015-10-02 NOTE — ED Notes (Signed)
See PA assessment 

## 2015-10-02 NOTE — ED Notes (Signed)
Patient states abscess under R breast since yesterday.   Patient denies other symptoms. Patient states has been using warm compresses at home.

## 2015-10-02 NOTE — ED Notes (Signed)
Declined W/C at D/C and was escorted to lobby by RN. 

## 2015-10-02 NOTE — Discharge Instructions (Signed)
You have been seen today for breast abscess. Follow up with PCP for chronic management of this issue. Return to ED should symptoms worsen. Regardless, return to the ED or go to a PCP in 3 days for a wound recheck.   Emergency Department Resource Guide 1) Find a Doctor and Pay Out of Pocket Although you won't have to find out who is covered by your insurance plan, it is a good idea to ask around and get recommendations. You will then need to call the office and see if the doctor you have chosen will accept you as a new patient and what types of options they offer for patients who are self-pay. Some doctors offer discounts or will set up payment plans for their patients who do not have insurance, but you will need to ask so you aren't surprised when you get to your appointment.  2) Contact Your Local Health Department Not all health departments have doctors that can see patients for sick visits, but many do, so it is worth a call to see if yours does. If you don't know where your local health department is, you can check in your phone book. The CDC also has a tool to help you locate your state's health department, and many state websites also have listings of all of their local health departments.  3) Find a Walk-in Clinic If your illness is not likely to be very severe or complicated, you may want to try a walk in clinic. These are popping up all over the country in pharmacies, drugstores, and shopping centers. They're usually staffed by nurse practitioners or physician assistants that have been trained to treat common illnesses and complaints. They're usually fairly quick and inexpensive. However, if you have serious medical issues or chronic medical problems, these are probably not your best option.  No Primary Care Doctor: - Call Health Connect at  [phone] - they can help you locate a primary care doctor that  accepts your insurance, provides certain services, etc. - Physician Referral Service-  [phone]  Chronic Pain Problems: Organization         Address  Phone   Notes  [facility]  [phone] Patients need to be referred by their primary care doctor.   Medication Assistance: Organization         Address  Phone   Notes  [facility] [address] [place], [place] [address] [phone]) [phone] --Must be a resident of [place] -- Must have NO insurance coverage whatsoever (no Medicaid/ Medicare, etc.) -- The pt. MUST have a primary care doctor that directs their care regularly and follows them in the community   MedAssist  [phone]   [employer]  [phone]    Agencies that provide inexpensive medical care: Organization         Address  Phone   Notes  [facility]  [phone]   [facility]    [phone]) [phone]   [facility] [address] [place], [place] [address] [phone]) [phone]   [facility] [address], [place] [phone]) [phone]   [facility]anned Parenthood    [phone]   [facility]    [phone]) [phone]   [facility]  [address], [place] Phone:  [phone], Fax:  [phone] Hours of Operation:  9 am - 6 pm, M-F.  Also accepts Medicaid/Medicare  and self-pay.  [facility]  [address], [place] Phone: [phone], Fax: [phone]. Hours of Operation:  8:30 am - 5:30 pm, M-F.  Also accepts Medicaid and self-pay.  [facility] [place] [address], [place] Phone: [phone]   [facility] [address] Natasha Bence [place], [place] [phone], Ext. [phone] Mondays & Thursdays: 7-9 AM.  First 15 patients are seen on a first come, first serve basis.    Medicaid-accepting [facility] Providers:  Organization         Address  Phone   Notes  [facility] [address],  [place] [phone] Also accepts self-pay patients.  [facility] [address] Laurell Josephs [place], [place]  [phone]   [facility] [address], [place] [phone]   [facility] [address], [place] [phone]   Renaye Rakers [address], [place]   [phone] Only accepts [place] Access [place] patients after they have their name applied to their card.   Self-Pay (no insurance) in [facility]:  Organization         Address  Phone   Notes  Sickle Cell Patients, [facility] [address] [place], [place] [phone]   [facility] [address] [place], [place] [phone]   [facility] [place]  [address], [place] [phone]   [facility]/Dr. Osei-Bonsu  [address] [place] or [address], [place] [phone] Phone number for both [place] and [place] locations is the same.  Urgent Medical and [facility] [address], [place] [phone]   [facility] [address], [place] or [address] [phone]   [facility] [address] [place] [phone], phone; [phone], fax Sees patients 1st and 3rd Saturday of every month.  Must not qualify for public or private insurance (i.e. Medicaid, Medicare, [place] Health Choice, Veterans' Benefits)  Household income should be no more than 200% of the poverty level The clinic cannot treat you if you are pregnant or think you are pregnant  Sexually transmitted diseases are not treated at the clinic.    Dental Care: Organization         Address  Phone  Notes  [facility] [address] [place], [place] [phone] Accepts children up to age 57 who are enrolled in  [place] or [place] Health Choice; pregnant women with a Medicaid card; and children who have applied for Medicaid or [place] Health Choice, but were declined, whose parents can pay a reduced fee at time of service.  [facility] of [facility]  [address], [place] [phone] Accepts children up to age 13 who are enrolled in [place] or [place] Health Choice; pregnant women with a Medicaid card; and children who have applied for Medicaid or [place] Health Choice, but were declined, whose parents can pay a reduced fee at time of service.  [facility]  [address] [place], [place] [phone] Patients are seen by appointment only. Walk-ins are not accepted. [facility] will see patients 21 years of age and older. Monday - Tuesday (8am-5pm) Most Wednesdays (  8:30-5pm) $30 per visit, cash only  [facility] Adult [facility] Access PRO[facility]  [address], [facility] [phone] Patients are seen by appointment only. Walk-ins are not accepted. [place] will see patients 72 years of age and older. One Wednesday Evening (Monthly: Volunteer Based).  $30 per visit, cash only  [place]  [phone] for adults; Children under age 24, call Graduate Pediatric Dentistry at [phone]. Children aged 47-14, please call [phone] to request a pediatric application.  Dental services are provided in all areas of dental care including fillings, crowns and bridges, complete and partial dentures, implants, gum treatment, root canals, and extractions. Preventive care is also provided. Treatment is provided to both adults and children. Patients are selected via a lottery and there is often a waiting list.   [facility] [address], [place]  [phone] [email or web address]   [facility] [address] [place], [place] [phone], Ext. [phone] Second and Fourth Thursday of each month, opens at 6:30  AM; Clinic ends at 9 AM.  Patients are seen on a first-come first-served basis, and a limited number are seen during each clinic.   [facility]  [address] Hillard Danker [place], [place] [phone]   Eligibility Requirements You must have lived in [place], [place], or [place] counties for at least the last three months.   You cannot be eligible for state or federal sponsored [employer], including [employer], [place], or [employer].   You generally cannot be eligible for healthcare insurance through your employer.    How to apply: Eligibility screenings are held every Tuesday and Wednesday afternoon from 1:00 pm until 4:00 pm. You do not need an appointment for the interview!  [facility] [address] [place], [place]   [place]  [place] Department  [place]  [phone]    Behavioral Health Resources in the Community: Intensive Outpatient Programs Organization         Address  Phone  Notes  [place]. [address] [place], [place] [phone]   [facility] Outpatient [address] [place], [place]   ADS: Alcohol [facility] Drug Svcs [address] [place], [place]   [place] [address],  [place], [place] or [phone]   Substance Abuse Resources Organization         Address  Phone  Notes  Alcohol and Drug Services  [phone]   [place]  [phone]   The [place]   Chinita Pester  [phone]   Residential & Outpatient Substance Abuse Program  [phone]   Psychological Services Organization         Address  Phone  Notes  [facility] [place]  [place]  [phone]   [place] [address] [place] or  [phone]    Mobile Crisis Teams Organization         Address  Phone  Notes  [facility], Mobile Crisis Care Unit  [phone]   [facility]  [address] [place], [place]   [place] Levels [address], [place] [phone]    Self-Help/Support Groups Organization         Address  Phone  Notes  [facility] [place] - variety of support groups  [place] Call for more information  Narcotics Anonymous (NA), [facility] [address], [employer] [place]  2 meetings at this location   Special educational needs teacher         Address  Phone  Notes  ASAP Residential Treatment [place],    [place]  [phone]   [facility]  [address] [place] [phone], [place], [place]   [place], [place] [phone] Admissions: 8am-3pm M-F  Incentives Substance [place] [address]    [place], [place] [phone]   [facility] [address] [place], [place], [place]   [facility] [address],  [place], [place]   [facility]ight Programs - Intensive Outpatient [address] Kristeen Mans [address], [place], [place]   [facility] [address].,  [place], [place] [phone] or [phone]   [facility] (RTS) [address] [place], [place] Accepts Medicaid  [facility] [place] [address],  [place] [phone] Substance Abuse/Addiction Treatment   [facility] Organization         Address  Phone  Notes  [facility]  [phone]   Domenic Schwab, PhD [address] Arlis Porta [place], [place]   [phone] or [phone]   [place], [place] [phone]   [facility] [address],  [place], [place] [phone] Insurance/Medicaid/sponsorship through [facility] and Families [address] [place] [address] [place]                                    [place], [place] [phone] [place] [address], [place] [phone]    Dr. Adele Schilder  [phone]   [facility] Dept. 1) [address], [place] 2) [place] 3)  [place] [address], [place] [phone]  [phone]   [place] [phone] or [phone] (After Hours)

## 2015-10-02 NOTE — ED Provider Notes (Signed)
CSN: [id number]     Arrival date & time 10/02/15  1032 History  By signing my name below, I, Janice Duarte, attest that this documentation has been prepared under the direction and in the presence of Janice Joy, PA-C. Electronically Signed: Tanda Duarte, ED Scribe. 10/02/2015. 11:19 AM.   Chief Complaint  Patient presents with  . Abscess   The history is provided by the patient. No language interpreter was used.     HPI Comments: Janice Duarte is a 25 y.o. female who presents to the Emergency Department complaining of gradual onset, constant, 10/10, pain from abscess underneath right breast x 1 day, worsening today. Pt has been applying warm compresses without relief. 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 Denies fever, chills, nausea, vomiting, difficulty breathing, discharge from the abscess, changes in breast, or any other associated symptoms.   Past Medical History  Diagnosis Date  . Scoliosis   . Bacterial vaginal infection   . Headaches, cluster   . Abscess    Past Surgical History  Procedure Laterality Date  . Wisdom tooth extraction     Family History  Problem Relation Age of Onset  . Diabetes Other   . Hypertension Other    Social History  Substance Use Topics  . Smoking status: Never Smoker   . Smokeless tobacco: Never Used  . Alcohol Use: Yes     Comment: socially   OB History    No data available     Review of Systems  Constitutional: Negative for fever and chills.  Respiratory: Negative for shortness of breath.   Gastrointestinal: Negative for nausea and vomiting.  Skin:       + Abscess to right breast Negative for discharge  All other systems reviewed and are negative.  Allergies  Bactrim and Penicillins  Home Medications   Prior to Admission medications   Medication Sig Start Date End Date Taking? Authorizing Provider  ibuprofen (ADVIL,MOTRIN) 600 MG tablet Take 1 tablet (600 mg total) by mouth every  6 (six) hours as needed. 05/29/14  Yes Earley Favor, NP  doxycycline (VIBRAMYCIN) 100 MG capsule Take 1 capsule (100 mg total) by mouth 2 (two) times daily. 10/02/15   Janice C Joy, PA-C  ibuprofen (ADVIL,MOTRIN) 800 MG tablet Take 1 tablet (800 mg total) by mouth 3 (three) times daily. 10/02/15   Janice C Joy, PA-C   Triage Vitals:  BP 133/98 mmHg  Pulse 101  Temp(Src) 98 F (36.7 C) (Oral)  Resp 20  SpO2 98%   Physical Exam  Constitutional: She is oriented to person, place, and time. She appears well-developed and well-nourished. No distress.  HENT:  Head: Normocephalic and atraumatic.  Eyes: Conjunctivae and EOM are normal.  Neck: Neck supple. No tracheal deviation present.  Cardiovascular: Normal rate.   Pulmonary/Chest: Effort normal. No respiratory distress.  Musculoskeletal: Normal range of motion.  Neurological: She is alert and oriented to person, place, and time.  Skin: Skin is warm and dry.  Quarter sized fluctuant mass with erythema located on the right inframammary fold with tenderness noted. No signs of surrounding cellulitis.  Psychiatric: She has a normal mood and affect. Her behavior is normal.  Nursing note and vitals reviewed.   ED Course  .Marland KitchenIncision and Drainage Date/Time: 10/02/2015 12:00 PM Performed by: Anselm Pancoast Authorized by: Harolyn Rutherford C Consent: Verbal consent obtained. Risks and benefits: risks, benefits and alternatives were discussed Consent given by: patient Patient understanding:  patient states understanding of the procedure being performed Patient consent: the patient's understanding of the procedure matches consent given Procedure consent: procedure consent matches procedure scheduled Patient identity confirmed: verbally with patient and arm band Time out: Immediately prior to procedure a "time out" was called to verify the correct patient, procedure, equipment, support staff and site/side marked as required. Type: abscess Body area:  trunk Location details: right breast Anesthesia: local infiltration Local anesthetic: bupivacaine 0.5% without epinephrine Anesthetic total: 4 ml Patient sedated: no Scalpel size: 11 Incision type: single straight Incision depth: subcutaneous Complexity: simple Drainage: purulent and  bloody Drainage amount: copious Wound treatment: wound left open Packing material: 1/4 in iodoform gauze Patient tolerance: Patient tolerated the procedure well with no immediate complications    (including critical care time)  DIAGNOSTIC STUDIES: Oxygen Saturation is 98% on RA, normal by my interpretation.    COORDINATION OF CARE: 11:19 AM-Discussed treatment plan which includes I&D with pt at bedside and pt agreed to plan.   Labs Review Labs Reviewed  CULTURE, ROUTINE-ABSCESS    Imaging Review No results found.   EKG Interpretation None      MDM   Final diagnoses:  Breast abscess    [name] presents with the abscess under her right breast for one day.  Patient has a history of recurrent abscesses. Patient is nontoxic appearing, afebrile, not tachycardic, not tachypneic, maintains SPO2 100% on room air, and is in no apparent distress. Patient has no indication of systemic illness or deep abscess into the chest. No indication for imaging at this time. Doxycycline chosen due to the patient's allergy to Bactrim. Patient tolerated I&D well, but due to the size of the abscess and its location patient was told that she would need to follow-up with a PCP or return to the ED in 3 days for wound check. The patient was given instructions for home care as well as return precautions. Patient voices understanding of these instructions, accepts the plan, and is comfortable with discharge.  I personally performed the services described in this documentation, which was scribed in my presence. The recorded information has been reviewed and is accurate.   Filed Vitals:   10/02/15 1035  10/02/15 1247  BP: 133/98 132/68  Pulse: 101 78  Temp: 98 F (36.7 C)   TempSrc: Oral   Resp: 20 18  SpO2: 98% 100%      Anselm PancoastShawn C Joy, PA-C 10/02/15 1450  Leta BaptistEmily Roe Nguyen, MD 10/06/15 [phone]

## 2015-10-05 LAB — CULTURE, ROUTINE-ABSCESS: Culture: NO GROWTH

## 2016-01-15 ENCOUNTER — Encounter (HOSPITAL_COMMUNITY): Payer: Self-pay

## 2016-01-15 ENCOUNTER — Emergency Department (HOSPITAL_COMMUNITY)
Admission: EM | Admit: 2016-01-15 | Discharge: 2016-01-15 | Disposition: A | Discharge status: Home / Self Care | Payer: Medicaid Other | Attending: Emergency Medicine | Admitting: Emergency Medicine

## 2016-01-15 ENCOUNTER — Encounter (HOSPITAL_COMMUNITY): Payer: Self-pay | Admitting: Emergency Medicine

## 2016-01-15 DIAGNOSIS — Z872 Personal history of diseases of the skin and subcutaneous tissue: Secondary | ICD-10-CM | POA: Insufficient documentation

## 2016-01-15 DIAGNOSIS — Z880 Allergy status to penicillin: Secondary | ICD-10-CM | POA: Insufficient documentation

## 2016-01-15 DIAGNOSIS — M419 Scoliosis, unspecified: Secondary | ICD-10-CM | POA: Insufficient documentation

## 2016-01-15 DIAGNOSIS — Z8742 Personal history of other diseases of the female genital tract: Secondary | ICD-10-CM | POA: Insufficient documentation

## 2016-01-15 DIAGNOSIS — G43909 Migraine, unspecified, not intractable, without status migrainosus: Secondary | ICD-10-CM

## 2016-01-15 MED ORDER — METOCLOPRAMIDE HCL 5 MG/ML IJ SOLN
10.0000 mg | Freq: Once | INTRAMUSCULAR | Status: AC
  Administered 2016-01-15: 10 mg via INTRAVENOUS
  Filled 2016-01-15: qty 2

## 2016-01-15 MED ORDER — KETOROLAC TROMETHAMINE 30 MG/ML IJ SOLN
30.0000 mg | Freq: Once | INTRAMUSCULAR | Status: AC
  Administered 2016-01-15: 30 mg via INTRAVENOUS
  Filled 2016-01-15: qty 1

## 2016-01-15 MED ORDER — SODIUM CHLORIDE 0.9 % IV BOLUS (SEPSIS)
500.0000 mL | Freq: Once | INTRAVENOUS | Status: AC
  Administered 2016-01-15: 500 mL via INTRAVENOUS

## 2016-01-15 MED ORDER — DEXAMETHASONE SODIUM PHOSPHATE 10 MG/ML IJ SOLN
10.0000 mg | Freq: Once | INTRAMUSCULAR | Status: AC
  Administered 2016-01-15: 10 mg via INTRAVENOUS
  Filled 2016-01-15: qty 1

## 2016-01-15 MED ORDER — DIPHENHYDRAMINE HCL 50 MG/ML IJ SOLN
12.5000 mg | Freq: Once | INTRAMUSCULAR | Status: AC
  Administered 2016-01-15: 12.5 mg via INTRAVENOUS
  Filled 2016-01-15: qty 1

## 2016-01-15 NOTE — ED Notes (Signed)
Per pt, states headache since yesterday-went to [facility] early this am but there was a wait

## 2016-01-15 NOTE — Discharge Instructions (Signed)
Migraine Headache A migraine headache is an intense, throbbing pain on one or both sides of your head. A migraine can last for 30 minutes to several hours. CAUSES  The exact cause of a migraine headache is not always known. However, a migraine may be caused when nerves in the brain become irritated and release chemicals that cause inflammation. This causes pain. Certain things may also trigger migraines, such as:  Alcohol.  Smoking.  Stress.  Menstruation.  Aged cheeses.  Foods or drinks that contain nitrates, glutamate, aspartame, or tyramine.  Lack of sleep.  Chocolate.  Caffeine.  Hunger.  Physical exertion.  Fatigue.  Medicines used to treat chest pain (nitroglycerine), birth control pills, estrogen, and some blood pressure medicines. SIGNS AND SYMPTOMS  Pain on one or both sides of your head.  Pulsating or throbbing pain.  Severe pain that prevents daily activities.  Pain that is aggravated by any physical activity.  Nausea, vomiting, or both.  Dizziness.  Pain with exposure to bright lights, loud noises, or activity.  General sensitivity to bright lights, loud noises, or smells. Before you get a migraine, you may get warning signs that a migraine is coming (aura). An aura may include:  Seeing flashing lights.  Seeing bright spots, halos, or zigzag lines.  Having tunnel vision or blurred vision.  Having feelings of numbness or tingling.  Having trouble talking.  Having muscle weakness. DIAGNOSIS  A migraine headache is often diagnosed based on:  Symptoms.  Physical exam.  A CT scan or MRI of your head. These imaging tests cannot diagnose migraines, but they can help rule out other causes of headaches. TREATMENT Medicines may be given for pain and nausea. Medicines can also be given to help prevent recurrent migraines.  HOME CARE INSTRUCTIONS  Only take over-the-counter or prescription medicines for pain or discomfort as directed by your  health care provider. The use of long-term narcotics is not recommended.  Lie down in a dark, quiet room when you have a migraine.  Keep a journal to find out what may trigger your migraine headaches. For example, write down:  What you eat and drink.  How much sleep you get.  Any change to your diet or medicines.  Limit alcohol consumption.  Quit smoking if you smoke.  Get 7-9 hours of sleep, or as recommended by your health care provider.  Limit stress.  Keep lights dim if bright lights bother you and make your migraines worse. SEEK IMMEDIATE MEDICAL CARE IF:   Your migraine becomes severe.  You have a fever.  You have a stiff neck.  You have vision loss.  You have muscular weakness or loss of muscle control.  You start losing your balance or have trouble walking.  You feel faint or pass out.  You have severe symptoms that are different from your first symptoms. MAKE SURE YOU:   Understand these instructions.  Will watch your condition.  Will get help right away if you are not doing well or get worse.   This information is not intended to replace advice given to you by your health care provider. Make sure you discuss any questions you have with your health care provider.  Encourage adequate hydration, drink plenty of fluids. Take ibuprofen at home as needed for headache. Follow-up with [facility] if her symptoms are not improved. Return to the ER if you experience blurry vision, vomiting, neck pain or stiffness, fever, chills.

## 2016-01-15 NOTE — ED Provider Notes (Signed)
CSN: [id number]     Arrival date & time 01/15/16  1139 History   First MD Initiated Contact with Patient 01/15/16 1146     Chief Complaint  Patient presents with  . Headache     (Consider location/radiation/quality/duration/timing/severity/associated sxs/prior Treatment) HPI   Janice Duarte is a 25 year old female with a history of migraines, scoliosis who presents the ED today complaining of headache. Patient states that last night around 4 PM she developed the left-sided headache that is progressively worsened. She has associated nausea, photophobia, phonophobia. Patient went to the ED last night to be treated but states that there was a 5 hour wait so she did not get seen. Patient states that she went to work last night but was sent home by her manager due to the headache. She has tried taking BC powder without relief for symptoms. She denies paresthesias, weakness, blurry vision, fever, neck pain or stiffness, vomiting.   Past Medical History  Diagnosis Date  . Scoliosis   . Bacterial vaginal infection   . Headaches, cluster   . Abscess    Past Surgical History  Procedure Laterality Date  . Wisdom tooth extraction     Family History  Problem Relation Age of Onset  . Diabetes Other   . Hypertension Other    Social History  Substance Use Topics  . Smoking status: Never Smoker   . Smokeless tobacco: Never Used  . Alcohol Use: Yes     Comment: socially   OB History    No data available     Review of Systems  All other systems reviewed and are negative.     Allergies  Penicillins and Bactrim  Home Medications   Prior to Admission medications   Medication Sig Start Date End Date Taking? Authorizing Provider  acetaminophen (TYLENOL) 325 MG tablet Take 975 mg by mouth every 3 (three) hours as needed for mild pain, moderate pain, fever or headache.    Yes Historical Provider, MD  Aspirin-Salicylamide-Caffeine (BC HEADACHE POWDER PO) Take 1 packet by mouth once.    Yes Historical Provider, MD  ibuprofen (ADVIL,MOTRIN) 200 MG tablet Take 800 mg by mouth every 4 (four) hours as needed for fever, headache, mild pain, moderate pain or cramping.   Yes Historical Provider, MD  doxycycline (VIBRAMYCIN) 100 MG capsule Take 1 capsule (100 mg total) by mouth 2 (two) times daily. Patient not taking: Reported on 01/15/2016 10/02/15   Shawn C Joy, PA-C  ibuprofen (ADVIL,MOTRIN) 800 MG tablet Take 1 tablet (800 mg total) by mouth 3 (three) times daily. Patient not taking: Reported on 01/15/2016 10/02/15   Shawn C Joy, PA-C   BP 139/100 mmHg  Pulse 77  Temp(Src) 97.9 F (36.6 C) (Oral)  Resp 16  SpO2 100%  LMP 01/10/2016 (Exact Date) Physical Exam  Constitutional: She is oriented to person, place, and time. She appears well-developed and well-nourished. No distress.  HENT:  Head: Normocephalic and atraumatic.  Mouth/Throat: No oropharyngeal exudate.  Eyes: Conjunctivae and EOM are normal. Pupils are equal, round, and reactive to light. Right eye exhibits no discharge. Left eye exhibits no discharge. No scleral icterus.  Neck: Neck supple.  No meningismus.  Cardiovascular: Normal rate, regular rhythm, normal heart sounds and intact distal pulses.  Exam reveals no gallop and no friction rub.   No murmur heard. Pulmonary/Chest: Effort normal and breath sounds normal. No respiratory distress. She has no wheezes. She has no rales. She exhibits no tenderness.  Abdominal: Soft. She exhibits no  distension. There is no tenderness. There is no guarding.  Musculoskeletal: Normal range of motion. She exhibits no edema.  Lymphadenopathy:    She has no cervical adenopathy.  Neurological: She is alert and oriented to person, place, and time. No cranial nerve deficit.  Strength 5/5 throughout. No sensory deficits.  No dysmetria. NO slurred speech.  Skin: Skin is warm and dry. No rash noted. She is not diaphoretic. No erythema. No pallor.  Psychiatric: She has a normal mood and  affect. Her behavior is normal.  Nursing note and vitals reviewed.   ED Course  Procedures (including critical care time) Labs Review Labs Reviewed - No data to display  Imaging Review No results found. I have personally reviewed and evaluated these images and lab results as part of my medical decision-making.   EKG Interpretation None      MDM   Final diagnoses:  Migraine without status migrainosus, not intractable, unspecified migraine type    The patient arrived with signs and symptoms consistent with a migraine headache. The patient has history of migraines. This feels like previous migraines.The patient has a reassuring neuro exam lessening chances of intracranial abnormalities. The patient was given decadron, Reglan, Benadryl with patient's pain significantly improved and is ready for discharge. The patient remained in no acute distress, hemodynamically stable with reassuring neuro exam. She is requesting a work note. The patient was then discharged from the Emergency Department with no further acute issues. Recommend follow up with neurologist or PCP within the next week.      Lester Kinsman [place], PA-C 01/15/16 1526  Rolland Porter, MD 01/25/16 Aretha Parrot

## 2016-01-15 NOTE — ED Notes (Signed)
Pt complaining of migraine. Pt state L sided pain with sensitivity to light and sound.

## 2016-01-16 ENCOUNTER — Emergency Department (HOSPITAL_COMMUNITY): Payer: Medicaid Other

## 2016-01-16 ENCOUNTER — Encounter (HOSPITAL_COMMUNITY): Payer: Self-pay | Admitting: Emergency Medicine

## 2016-01-16 ENCOUNTER — Emergency Department (HOSPITAL_COMMUNITY)
Admission: EM | Admit: 2016-01-16 | Discharge: 2016-01-16 | Disposition: A | Discharge status: Home / Self Care | Payer: Medicaid Other | Attending: Emergency Medicine | Admitting: Emergency Medicine

## 2016-01-16 DIAGNOSIS — Z880 Allergy status to penicillin: Secondary | ICD-10-CM | POA: Insufficient documentation

## 2016-01-16 DIAGNOSIS — I10 Essential (primary) hypertension: Secondary | ICD-10-CM | POA: Insufficient documentation

## 2016-01-16 DIAGNOSIS — R51 Headache: Secondary | ICD-10-CM | POA: Diagnosis not present

## 2016-01-16 DIAGNOSIS — D72829 Elevated white blood cell count, unspecified: Secondary | ICD-10-CM | POA: Diagnosis not present

## 2016-01-16 DIAGNOSIS — Z8669 Personal history of other diseases of the nervous system and sense organs: Secondary | ICD-10-CM | POA: Diagnosis not present

## 2016-01-16 DIAGNOSIS — M419 Scoliosis, unspecified: Secondary | ICD-10-CM | POA: Insufficient documentation

## 2016-01-16 DIAGNOSIS — R0602 Shortness of breath: Secondary | ICD-10-CM | POA: Insufficient documentation

## 2016-01-16 DIAGNOSIS — R0789 Other chest pain: Secondary | ICD-10-CM | POA: Diagnosis not present

## 2016-01-16 DIAGNOSIS — Z872 Personal history of diseases of the skin and subcutaneous tissue: Secondary | ICD-10-CM | POA: Diagnosis not present

## 2016-01-16 DIAGNOSIS — R519 Headache, unspecified: Secondary | ICD-10-CM

## 2016-01-16 DIAGNOSIS — R079 Chest pain, unspecified: Secondary | ICD-10-CM | POA: Diagnosis present

## 2016-01-16 DIAGNOSIS — Z8742 Personal history of other diseases of the female genital tract: Secondary | ICD-10-CM | POA: Diagnosis not present

## 2016-01-16 LAB — BASIC METABOLIC PANEL
Anion gap: 7 (ref 5–15)
BUN: 10 mg/dL (ref 6–20)
CO2: 25 mmol/L (ref 22–32)
CREATININE: 0.68 mg/dL (ref 0.44–1.00)
Calcium: 9.4 mg/dL (ref 8.9–10.3)
Chloride: 107 mmol/L (ref 101–111)
GFR calc Af Amer: >60 mL/min (ref >60)
Glucose, Bld: 101 mg/dL — ABNORMAL HIGH (ref 65–99)
Potassium: 3.8 mmol/L (ref 3.5–5.1)
SODIUM: 139 mmol/L (ref 135–145)

## 2016-01-16 LAB — DIFFERENTIAL
BASOS PCT: 0 %
Basophils Absolute: 0 10*3/uL (ref 0.0–0.1)
EOS PCT: 0 %
Eosinophils Absolute: 0 10*3/uL (ref 0.0–0.7)
LYMPHS PCT: 16 %
Lymphs Abs: 3.5 10*3/uL (ref 0.7–4.0)
MONO ABS: 1.7 10*3/uL — AB (ref 0.1–1.0)
Monocytes Relative: 8 %
Neutro Abs: 17.2 10*3/uL — ABNORMAL HIGH (ref 1.7–7.7)
Neutrophils Relative %: 76 %

## 2016-01-16 LAB — CBC
HCT: 35.1 % — ABNORMAL LOW (ref 36.0–46.0)
Hemoglobin: 12.2 g/dL (ref 12.0–15.0)
MCH: 24.4 pg — ABNORMAL LOW (ref 26.0–34.0)
MCHC: 34.8 g/dL (ref 30.0–36.0)
MCV: 70.2 fL — ABNORMAL LOW (ref 78.0–100.0)
PLATELETS: 380 10*3/uL (ref 150–400)
RBC: 5 MIL/uL (ref 3.87–5.11)
RDW: 14.4 % (ref 11.5–15.5)
WBC: 22.5 10*3/uL — AB (ref 4.0–10.5)

## 2016-01-16 LAB — I-STAT TROPONIN, ED
TROPONIN I, POC: 0 ng/mL (ref 0.00–0.08)
Troponin i, poc: 0 ng/mL (ref 0.00–0.08)

## 2016-01-16 LAB — D-DIMER, QUANTITATIVE (NOT AT ARMC): D DIMER QUANT: 0.27 ug{FEU}/mL (ref 0.00–0.50)

## 2016-01-16 MED ORDER — OXYCODONE-ACETAMINOPHEN 5-325 MG PO TABS
2.0000 | ORAL_TABLET | Freq: Once | ORAL | Status: AC
  Administered 2016-01-16: 1 via ORAL
  Filled 2016-01-16: qty 2

## 2016-01-16 MED ORDER — ONDANSETRON HCL 4 MG/2ML IJ SOLN: 4.0000 mg | Freq: Once | INTRAMUSCULAR | Status: DC

## 2016-01-16 MED ORDER — NAPROXEN 500 MG PO TABS: 500.0000 mg | ORAL_TABLET | Freq: Two times a day (BID) | ORAL | Status: DC

## 2016-01-16 MED ORDER — METHOCARBAMOL 500 MG PO TABS: 500.0000 mg | ORAL_TABLET | Freq: Two times a day (BID) | ORAL | Status: DC

## 2016-01-16 MED ORDER — ALBUTEROL SULFATE HFA 108 (90 BASE) MCG/ACT IN AERS
2.0000 | INHALATION_SPRAY | Freq: Once | RESPIRATORY_TRACT | Status: AC
  Administered 2016-01-16: 2 via RESPIRATORY_TRACT
  Filled 2016-01-16: qty 6.7

## 2016-01-16 MED ORDER — NAPROXEN 500 MG PO TABS
500.0000 mg | ORAL_TABLET | Freq: Once | ORAL | Status: AC
  Administered 2016-01-16: 500 mg via ORAL
  Filled 2016-01-16: qty 1

## 2016-01-16 MED ORDER — MORPHINE SULFATE (PF) 4 MG/ML IV SOLN: 4.0000 mg | Freq: Once | INTRAVENOUS | Status: DC

## 2016-01-16 MED ORDER — LORATADINE 10 MG PO TABS: 10.0000 mg | ORAL_TABLET | Freq: Every day | ORAL | Status: DC

## 2016-01-16 NOTE — ED Provider Notes (Signed)
CSN: [id number]     Arrival date & time 01/16/16  1352 History   First MD Initiated Contact with Patient 01/16/16 1504     Chief Complaint  Patient presents with  . Chest Pain  . Headache     (Consider location/radiation/quality/duration/timing/severity/associated sxs/prior Treatment) HPI Comments: Patient is a 25 year old female with a history of bacterial vaginosis and scoliosis who presents to the emergency department for complaints of chest pain. Patient states that she has had chest pain since waking from sleep this morning. Pain is intermittent, lasting for approximately 10 minutes before spontaneously resolving. Pain has been recurrent throughout the day and is present near the patient's bilateral anterior shoulders. She has not noted any modifying factors of her chest pain. Patient also noted some dyspnea on exertion today while at work. She took her blood pressure and found herself to be hypertensive to 150/90 with a subsequent read of 170s/100s approximately one hour later. She states that she has never had a history of hypertension or required any antihypertensives. Patient has also noted an occipital, mild, throbbing headache today. She was seen in the emergency department for headache yesterday, but states that her headache greatly improved with a migraine cocktail. She reports that her headache today is not nearly as bad as the pain she was seen for yesterday. Patient denies associated fever, syncope, nausea, vomiting, vision changes or vision loss, tinnitus or hearing loss, dizziness, lightheadedness, extremity numbness, extremity weakness, leg swelling, neck pain, or back pain. Patient denies the use of birth control pills. She denies any recent surgeries or hospitalizations. No personal or family history of blood clots.  Patient is a 25 y.o. female presenting with chest pain and headaches. The history is provided by the patient. No language interpreter was used.  Chest Pain Associated  symptoms: headache and shortness of breath   Associated symptoms: no diaphoresis, no nausea, no numbness, not vomiting and no weakness   Headache Associated symptoms: no nausea, no numbness, no vomiting and no weakness     Past Medical History  Diagnosis Date  . Scoliosis   . Bacterial vaginal infection   . Headaches, cluster   . Abscess    Past Surgical History  Procedure Laterality Date  . Wisdom tooth extraction     Family History  Problem Relation Age of Onset  . Diabetes Other   . Hypertension Other    Social History  Substance Use Topics  . Smoking status: Never Smoker   . Smokeless tobacco: Never Used  . Alcohol Use: Yes     Comment: socially   OB History    No data available      Review of Systems  Constitutional: Negative for diaphoresis.  Eyes: Negative for visual disturbance.  Respiratory: Positive for shortness of breath.   Cardiovascular: Positive for chest pain.  Gastrointestinal: Negative for nausea and vomiting.  Neurological: Positive for headaches. Negative for syncope, weakness and numbness.  All other systems reviewed and are negative.   Allergies  Penicillins and Bactrim  Home Medications   Prior to Admission medications   Medication Sig Start Date End Date Taking? Authorizing Provider  Aspirin-Salicylamide-Caffeine (BC HEADACHE POWDER PO) Take 1 packet by mouth daily as needed (head pain).    Yes Historical Provider, MD  acetaminophen (TYLENOL) 325 MG tablet Take 975 mg by mouth every 3 (three) hours as needed for mild pain, moderate pain, fever or headache.     Historical Provider, MD  doxycycline (VIBRAMYCIN) 100 MG capsule Take 1 capsule (  100 mg total) by mouth 2 (two) times daily. Patient not taking: Reported on 01/15/2016 10/02/15   Shawn C Joy, PA-C  ibuprofen (ADVIL,MOTRIN) 200 MG tablet Take 800 mg by mouth every 4 (four) hours as needed for fever, headache, mild pain, moderate pain or cramping.    Historical Provider, MD  ibuprofen  (ADVIL,MOTRIN) 800 MG tablet Take 1 tablet (800 mg total) by mouth 3 (three) times daily. Patient not taking: Reported on 01/15/2016 10/02/15   Shawn C Joy, PA-C  loratadine (CLARITIN) 10 MG tablet Take 1 tablet (10 mg total) by mouth daily. 01/16/16   Antony MaduraKelly Brynja Marker, PA-C  methocarbamol (ROBAXIN) 500 MG tablet Take 1 tablet (500 mg total) by mouth 2 (two) times daily. 01/16/16   Antony MaduraKelly Saba Gomm, PA-C  naproxen (NAPROSYN) 500 MG tablet Take 1 tablet (500 mg total) by mouth 2 (two) times daily. 01/16/16   Antony MaduraKelly Taneah Masri, PA-C   BP 146/103 mmHg  Pulse 85  Temp(Src) 98.4 F (36.9 C) (Oral)  Resp 14  SpO2 100%  LMP 01/10/2016 (Exact Date)   Physical Exam  Constitutional: She is oriented to person, place, and time. She appears well-developed and well-nourished. No distress.  Nontoxic appearing. In no acute distress.  HENT:  Head: Normocephalic and atraumatic.  Eyes: Conjunctivae and EOM are normal. No scleral icterus.  Neck: Normal range of motion.  No JVD No nuchal rigidity or meningismus.  Cardiovascular: Normal rate, regular rhythm and intact distal pulses.   Pulmonary/Chest: Effort normal and breath sounds normal. No respiratory distress. She has no wheezes. She has no rales. She exhibits tenderness. She exhibits no crepitus, no deformity and no swelling.    Lungs clear to auscultation bilaterally. No accessory muscle use. Chest expansion symmetric  Abdominal: Soft. She exhibits no distension. There is no tenderness. There is no rebound.  Soft, obese, nontender abdomen  Musculoskeletal: Normal range of motion.  Neurological: She is alert and oriented to person, place, and time. No cranial nerve deficit. She exhibits normal muscle tone. Coordination normal.  GCS 15. No focal neurologic deficits appreciated.  Skin: Skin is warm and dry. No rash noted. She is not diaphoretic. No erythema. No pallor.  Psychiatric: She has a normal mood and affect. Her behavior is normal.  Nursing note and vitals  reviewed.   ED Course  Procedures (including critical care time) Labs Review Labs Reviewed  BASIC METABOLIC PANEL - Abnormal; Notable for the following:    Glucose, Bld 101 (*)    All other components within normal limits  CBC - Abnormal; Notable for the following:    WBC 22.5 (*)    HCT 35.1 (*)    MCV 70.2 (*)    MCH 24.4 (*)    All other components within normal limits  DIFFERENTIAL - Abnormal; Notable for the following:    Neutro Abs 17.2 (*)    Monocytes Absolute 1.7 (*)    All other components within normal limits  D-DIMER, QUANTITATIVE (NOT AT [facility])  Rosezena SensorI-STAT TROPOININ, ED  Rosezena SensorI-STAT TROPOININ, ED    Imaging Review Dg Chest 2 View  01/16/2016  CLINICAL DATA:  25 year old female with a history of headache. EXAM: CHEST - 2 VIEW COMPARISON:  12/26/2011 FINDINGS: Cardiomediastinal silhouette projects within normal limits in size and contour. No confluent airspace disease, pneumothorax, or pleural effusion. Scoliotic curvature of the thoracic spine, with apex right curvature centered at T8. Unremarkable appearance of the upper abdomen. IMPRESSION: No radiographic evidence of acute cardiopulmonary disease. Signed, Yvone NeuJaime S. Loreta AveWagner, DO [facility] Radiology Electronically Signed   By: Gilmer Mor D.O.   On: 01/16/2016 14:46   I have personally reviewed and evaluated these images and lab results as part of my medical decision-making.   EKG Interpretation   Date/Time:  Wednesday January 16 2016 14:04:08 EDT Ventricular Rate:  93 PR Interval:  139 QRS Duration: 79 QT Interval:  340 QTC Calculation: 423 R Axis:   35 Text Interpretation:  Sinus rhythm Borderline T abnormalities, anterior  leads Confirmed by Ranae Palms  MD, DAVID ([id number]) on 01/16/2016 5:48:19 PM      MDM   Final diagnoses:  Chest wall pain  Leukocytosis  Occipital headache  Essential hypertension    25 year old female presents to the emergency department for  evaluation of chest pain with associated dyspnea on exertion. Lungs are clear to auscultation bilaterally and patient has no hypoxia. She is PERC negative with a negative D dimer. Doubt PE. Patient also with a negative chest x-ray, without signs of pneumothorax, pleural effusion, pneumonia, or other acute cardiopulmonary abnormality. Cardiac workup is reassuring with negative troponin 2. Patient is at low risk for ACS, especially in light of age. Heart score is 1. Doubt cardiac etiology.  Chest pain is reproducible on palpation making musculoskeletal etiology much more likely. Patient with a mild, residual headache in her occiput. She is afebrile and without nuchal rigidity or meningismus. Doubt meningitis. Patient is noted to have a leukocytosis today; however, she was treated in the emergency department for headache yesterday with Decadron which could cause her white blood cell count to rise. Laboratory workup is otherwise noncontributory.  I do not believe further emergent workup is indicated. Plan to manage symptoms supportively with albuterol inhaler, Claritin, Robaxin, and naproxen. Information given on primary care follow-up. Return precautions discussed and provided. Patient discharged in satisfactory condition with no unaddressed concerns.   Filed Vitals:   01/16/16 1400 01/16/16 1521 01/16/16 1730  BP: 152/94 144/97 146/103  Pulse: 55 68 85  Temp: 98.4 F (36.9 C)    TempSrc: Oral    Resp: SpO2: 100% 100% 100%     Antony Madura, PA-C 01/16/16 2358  Loren Racer, MD 01/19/16 [phone]

## 2016-01-16 NOTE — Progress Notes (Signed)
Pt reports working at [employer] for eight months and just changed positions "a month ago" and my coverage has not kicked in yet"  Pt has attempted to get orange card services via [facility] at the DSS but has not completed the process at this time Pt has medicaid family planning only therefore Cm reviewed uninsured pcps, medication resources, DSS and financial resources [place]  CM spoke [place] who [place]cp.  CM discussed and provided written information for uninsured accepting pcps, discussed the importance of pcp vs EDP services for f/u care, www.needymeds.org, www.goodrx.com, discounted pharmacies and other [place] resources such as [facility] , [facility], affordable care act, financial assistance, uninsured dental services,  med assist, DSS and  health department  Reviewed resources for [place] uninsured accepting pcps like [facility], family medicine at [address], [facility], [facility], local urgent care centers, [facility], [facility] family practice, general medical clinics, family services of the [place], [facility] urgent care plus others, medication resources, [facility] out patient pharmacies and housing Pt voiced understanding and appreciation of resources provided   Provided [facility] contact information Pt agreed to a referral Cm completed referral Pt to be contact by [facility] clinical liaison Pt encouraged to use uninsured resources prior to her job coverage kicking in Encouraged f/u for headache and other s/s for this ED visit  Female family visitor with pt  Pt states she has a medication discount card

## 2016-01-16 NOTE — Progress Notes (Signed)
Entered in d/c instructions  Please use the resources provided to you in emergency room by case manager to assist with doctor for follow up A referral for you has been sent to [facility] if you have not received a call in 3 days you may contact them Call Karen Andrianos at [phone] Tuesday-Friday [email or web address] These [place] uninsured resources provide possible primary care providers, resources for discounted medications, housing, dental resources, affordable care act information, plus other resources for [place]   

## 2016-01-16 NOTE — ED Notes (Signed)
PA to speak with patient about plan.

## 2016-01-16 NOTE — ED Notes (Signed)
Ambulated with pulse ox. Lowest being 98% on room air.

## 2016-01-16 NOTE — ED Notes (Signed)
MD at bedside. 

## 2016-01-16 NOTE — ED Notes (Signed)
PA at bedside. Pt given Sprite to drink.

## 2016-01-16 NOTE — Progress Notes (Addendum)
Pt given also a list of medicaid providers in [place] for her to check with dss to see if she has availability to see Pt with 4 ED visits in the last 6 months

## 2016-01-16 NOTE — Discharge Instructions (Signed)
Use an albuterol inhaler, 2 puffs every 4-6 hours, as needed for cough and shortness of breath. Take Claritin as prescribed for congestion. Take Naproxen and Robaxin for chest discomfort. Follow up with a primary care doctor for further evaluation of your symptoms and for blood pressure recheck.  Chest Wall Pain Chest wall pain is pain in or around the bones and muscles of your chest. Sometimes, an injury causes this pain. Sometimes, the cause may not be known. This pain may take several weeks or longer to get better. HOME CARE INSTRUCTIONS  Pay attention to any changes in your symptoms. Take these actions to help with your pain:   Rest as told by your health care provider.   Avoid activities that cause pain. These include any activities that use your chest muscles or your abdominal and side muscles to lift heavy items.   If directed, apply ice to the painful area:  Put ice in a plastic bag.  Place a towel between your skin and the bag.  Leave the ice on for 20 minutes, 2-3 times per day.  Take over-the-counter and prescription medicines only as told by your health care provider.  Do not use tobacco products, including cigarettes, chewing tobacco, and e-cigarettes. If you need help quitting, ask your health care provider.  Keep all follow-up visits as told by your health care provider. This is important. SEEK MEDICAL CARE IF:  You have a fever.  Your chest pain becomes worse.  You have new symptoms. SEEK IMMEDIATE MEDICAL CARE IF:  You have nausea or vomiting.  You feel sweaty or light-headed.  You have a cough with phlegm (sputum) or you cough up blood.  You develop shortness of breath.   This information is not intended to replace advice given to you by your health care provider. Make sure you discuss any questions you have with your health care provider.   Document Released: 09/15/2005 Document Revised: 06/06/2015 Document Reviewed: 12/11/2014 Elsevier Interactive  Patient Education 2016 Elsevier Inc. Bronchospasm, Adult A bronchospasm is when the tubes that carry air in and out of your lungs (airways) spasm or tighten. During a bronchospasm it is hard to breathe. This is because the airways get smaller. A bronchospasm can be triggered by:  Allergies. These may be to animals, pollen, food, or mold.  Infection. This is a common cause of bronchospasm.  Exercise.  Irritants. These include pollution, cigarette smoke, strong odors, aerosol sprays, and paint fumes.  Weather changes.  Stress.  Being emotional. HOME CARE   Always have a plan for getting help. Know when to call your doctor and local emergency services (911 in the [place] Know where you can get emergency care.  Only take medicines as told by your doctor.  If you were prescribed an inhaler or nebulizer machine, ask your doctor how to use it correctly. Always use a spacer with your inhaler if you were given one.  Stay calm during an attack. Try to relax and breathe more slowly.  Control your home environment:  Change your heating and air conditioning filter at least once a month.  Limit your use of fireplaces and wood stoves.  Do not  smoke. Do not  allow smoking in your home.  Avoid perfumes and fragrances.  Get rid of pests (such as roaches and mice) and their droppings.  Throw away plants if you see mold on them.  Keep your house clean and dust free.  Replace carpet with wood, tile, or vinyl flooring. Carpet can  trap dander and dust.  Use allergy-proof pillows, mattress covers, and box spring covers.  Wash bed sheets and blankets every week in hot water. Dry them in a dryer.  Use blankets that are made of polyester or cotton.  Wash hands frequently. GET HELP IF:  You have muscle aches.  You have chest pain.  The thick spit you spit or cough up (sputum) changes from clear or white to yellow, green, gray, or bloody.  The thick spit you spit or cough up gets  thicker.  There are problems that may be related to the medicine you are given such as:  A rash.  Itching.  Swelling.  Trouble breathing. GET HELP RIGHT AWAY IF:  You feel you cannot breathe or catch your breath.  You cannot stop coughing.  Your treatment is not helping you breathe better.  You have very bad chest pain. MAKE SURE YOU:   Understand these instructions.  Will watch your condition.  Will get help right away if you are not doing well or get worse.   This information is not intended to replace advice given to you by your health care provider. Make sure you discuss any questions you have with your health care provider.   Document Released: 07/13/2009 Document Revised: 10/06/2014 Document Reviewed: 03/08/2013 Elsevier Interactive Patient Education [employer]

## 2016-01-16 NOTE — ED Notes (Signed)
Pt states she was seen here yesterday for a headache. Today her headache isn't feeling any better. She's also having right sided chest pain that started this morning with some SOB, the pain radiating into bilateral shoulders. Denies N/V, dizziness, back pain

## 2016-11-24 ENCOUNTER — Emergency Department (HOSPITAL_COMMUNITY)
Admission: EM | Admit: 2016-11-24 | Discharge: 2016-11-24 | Disposition: A | Discharge status: Home / Self Care | Payer: Medicaid Other | Attending: Emergency Medicine | Admitting: Emergency Medicine

## 2016-11-24 ENCOUNTER — Encounter (HOSPITAL_COMMUNITY): Payer: Self-pay

## 2016-11-24 DIAGNOSIS — Z79899 Other long term (current) drug therapy: Secondary | ICD-10-CM | POA: Insufficient documentation

## 2016-11-24 DIAGNOSIS — Z7982 Long term (current) use of aspirin: Secondary | ICD-10-CM | POA: Insufficient documentation

## 2016-11-24 DIAGNOSIS — L0291 Cutaneous abscess, unspecified: Secondary | ICD-10-CM

## 2016-11-24 DIAGNOSIS — L02412 Cutaneous abscess of left axilla: Secondary | ICD-10-CM | POA: Insufficient documentation

## 2016-11-24 MED ORDER — LIDOCAINE-EPINEPHRINE (PF) 2 %-1:200000 IJ SOLN
10.0000 mL | Freq: Once | INTRAMUSCULAR | Status: DC
Start: 2016-11-24 — End: 2016-11-24
  Filled 2016-11-24: qty 20

## 2016-11-24 MED ORDER — SODIUM BICARBONATE 4 % IV SOLN
5.0000 mL | Freq: Once | INTRAVENOUS | Status: DC
  Filled 2016-11-24: qty 5

## 2016-11-24 NOTE — ED Notes (Signed)
Pt not found in room.  Notify PA

## 2016-11-24 NOTE — ED Triage Notes (Signed)
Pt states HX of "boils". Boil under left arm for 3 days. Tylenol, compress used without success. Here for evaluation. Body aches

## 2016-11-24 NOTE — ED Provider Notes (Signed)
[facility]-EMERGENCY DEPT Provider Note   CSN: [id number] Arrival date & time: 11/24/16  4098     History   Chief Complaint Chief Complaint  Patient presents with  . Generalized Body Aches  . Recurrent Skin Infections    HPI Janice Duarte is a 26 y.o. female complains of a "boil" the size of a "golf ball" under left arm for 3 days. She describes it as worsening, constant pain, throbbing, sharp pain 10/10. She states pressing on it and laying on that side makes it worse. Nothing has made it better. She reports associated chills and diarrhea 2 episodes with no blood. She denies fevers, nausea vomiting. She states this happened before in different areas and states she gets them 4 or 5 times a month. She states she usually gets this and tries to sqeeze them and relieve them herself. She states this one is in a spot where she has had an I&D done before. She states she has tried warm compress and tylenol with no relief. She states it hurt so bad she wasn't able to sleep last night. She denies history IVDU.   She also complains of nasal congestions  The history is provided by the patient. No language interpreter was used.    Past Medical History:  Diagnosis Date  . Abscess   . Bacterial vaginal infection   . Headaches, cluster   . Scoliosis     There are no active problems to display for this patient.   Past Surgical History:  Procedure Laterality Date  . WISDOM TOOTH EXTRACTION      OB History    No data available       Home Medications    Prior to Admission medications   Medication Sig Start Date End Date Taking? Authorizing Provider  acetaminophen (TYLENOL) 325 MG tablet Take 975 mg by mouth every 3 (three) hours as needed for mild pain, moderate pain, fever or headache.     Historical Provider, MD  Aspirin-Salicylamide-Caffeine (BC HEADACHE POWDER PO) Take 1 packet by mouth daily as needed (head pain).     Historical Provider, MD  doxycycline (VIBRAMYCIN) 100 MG  capsule Take 1 capsule (100 mg total) by mouth 2 (two) times daily. Patient not taking: Reported on 01/15/2016 10/02/15   Shawn C Joy, PA-C  ibuprofen (ADVIL,MOTRIN) 200 MG tablet Take 800 mg by mouth every 4 (four) hours as needed for fever, headache, mild pain, moderate pain or cramping.    Historical Provider, MD  ibuprofen (ADVIL,MOTRIN) 800 MG tablet Take 1 tablet (800 mg total) by mouth 3 (three) times daily. Patient not taking: Reported on 01/15/2016 10/02/15   Shawn C Joy, PA-C  loratadine (CLARITIN) 10 MG tablet Take 1 tablet (10 mg total) by mouth daily. 01/16/16   Antony Madura, PA-C  methocarbamol (ROBAXIN) 500 MG tablet Take 1 tablet (500 mg total) by mouth 2 (two) times daily. 01/16/16   Antony Madura, PA-C  naproxen (NAPROSYN) 500 MG tablet Take 1 tablet (500 mg total) by mouth 2 (two) times daily. 01/16/16   Antony Madura, PA-C    Family History Family History  Problem Relation Age of Onset  . Diabetes Other   . Hypertension Other     Social History Social History  Substance Use Topics  . Smoking status: Never Smoker  . Smokeless tobacco: Never Used  . Alcohol use Yes     Comment: socially     Allergies   Penicillins and Bactrim [sulfamethoxazole-trimethoprim]   Review of Systems Review  of Systems  Gastrointestinal: Negative for abdominal pain and vomiting.  Genitourinary: Negative for difficulty urinating and dysuria.     Physical Exam Updated Vital Signs BP 138/84   Pulse 97   Temp 98.4 F (36.9 C) (Oral)   Resp 15   Ht 5' 5.5" (1.664 m)   Wt 74.8 kg   LMP 10/31/2016   SpO2 100%   BMI 27.04 kg/m   Physical Exam  Constitutional: She appears well-developed and well-nourished.  Well appearing  HENT:  Head: Normocephalic and atraumatic.  Nose: Nose normal.  Eyes: Conjunctivae and EOM are normal.  Neck: Normal range of motion.  Cardiovascular: Normal rate and normal heart sounds.   Pulmonary/Chest: Effort normal. No respiratory distress. She has no wheezes.  She has no rales.  Normal work of breathing. No respiratory distress noted.   Abdominal: Soft. There is no tenderness. There is no rebound and no guarding.  Musculoskeletal: Normal range of motion.  Neurological: She is alert.  Skin: Skin is warm.  About 4 cm Abscess noted at left axilla. Redness to the area, without surrounding erythema. Tenderness to palpation.   Psychiatric: She has a normal mood and affect. Her behavior is normal.  Nursing note and vitals reviewed.    ED Treatments / Results  Labs (all labs ordered are listed, but only abnormal results are displayed) Labs Reviewed - No data to display  EKG  EKG Interpretation None       Radiology No results found.  Procedures Procedures (including critical care time)  Medications Ordered in ED Medications  sodium bicarbonate (NEUT) 4 % injection 5 mL (not administered)  lidocaine-EPINEPHrine (XYLOCAINE W/EPI) 2 %-1:200000 (PF) injection 10 mL (not administered)     Initial Impression / Assessment and Plan / ED Course  I have reviewed the triage vital signs and the nursing notes.  Pertinent labs & imaging results that were available during my care of the patient were reviewed by me and considered in my medical decision making (see chart for details).    Patient left after history and assessment taken. I was not able to perform I&D.  She did not inform me or anyone else in her care regarding any issues or that she was leaving.   Patient had visible abscess at her left axilla that needed I&D.    Final Clinical Impressions(s) / ED Diagnoses   Final diagnoses:  Abscess    New Prescriptions Discharge Medication List as of 11/24/2016 11:50 AM       [address], [place] 11/24/16 1205    Arby BarretteMarcy Pfeiffer, MD 11/24/16 [phone]

## 2016-11-24 NOTE — ED Notes (Signed)
Still not found in room. PA aware

## 2017-01-04 ENCOUNTER — Encounter (HOSPITAL_COMMUNITY): Payer: Self-pay

## 2017-01-04 ENCOUNTER — Emergency Department (HOSPITAL_COMMUNITY)
Admission: EM | Admit: 2017-01-04 | Discharge: 2017-01-05 | Disposition: A | Discharge status: Home / Self Care | Payer: Medicaid Other | Attending: Emergency Medicine | Admitting: Emergency Medicine

## 2017-01-04 DIAGNOSIS — N939 Abnormal uterine and vaginal bleeding, unspecified: Secondary | ICD-10-CM | POA: Insufficient documentation

## 2017-01-04 DIAGNOSIS — R112 Nausea with vomiting, unspecified: Secondary | ICD-10-CM | POA: Insufficient documentation

## 2017-01-04 DIAGNOSIS — Z79899 Other long term (current) drug therapy: Secondary | ICD-10-CM | POA: Insufficient documentation

## 2017-01-04 DIAGNOSIS — T7840XA Allergy, unspecified, initial encounter: Secondary | ICD-10-CM | POA: Insufficient documentation

## 2017-01-04 DIAGNOSIS — R1030 Lower abdominal pain, unspecified: Secondary | ICD-10-CM

## 2017-01-04 LAB — URINALYSIS, ROUTINE W REFLEX MICROSCOPIC
BACTERIA UA: NONE SEEN
Bilirubin Urine: NEGATIVE
Glucose, UA: NEGATIVE mg/dL
KETONES UR: NEGATIVE mg/dL
LEUKOCYTES UA: NEGATIVE
NITRITE: NEGATIVE
PH: 7 (ref 5.0–8.0)
Protein, ur: NEGATIVE mg/dL
SPECIFIC GRAVITY, URINE: 1.019 (ref 1.005–1.030)

## 2017-01-04 LAB — COMPREHENSIVE METABOLIC PANEL
ALBUMIN: 4 g/dL (ref 3.5–5.0)
ALK PHOS: 50 U/L (ref 38–126)
ALT: 12 U/L — ABNORMAL LOW (ref 14–54)
ANION GAP: 5 (ref 5–15)
AST: 19 U/L (ref 15–41)
BILIRUBIN TOTAL: 0.9 mg/dL (ref 0.3–1.2)
BUN: 6 mg/dL (ref 6–20)
CALCIUM: 9.1 mg/dL (ref 8.9–10.3)
CO2: 28 mmol/L (ref 22–32)
Chloride: 103 mmol/L (ref 101–111)
Creatinine, Ser: 0.7 mg/dL (ref 0.44–1.00)
GFR calc Af Amer: >60 mL/min (ref >60)
GFR calc non Af Amer: >60 mL/min (ref >60)
GLUCOSE: 100 mg/dL — AB (ref 65–99)
Potassium: 3.8 mmol/L (ref 3.5–5.1)
Sodium: 136 mmol/L (ref 135–145)
TOTAL PROTEIN: 8.2 g/dL — AB (ref 6.5–8.1)

## 2017-01-04 LAB — CBC
HEMATOCRIT: 32.8 % — AB (ref 36.0–46.0)
Hemoglobin: 10.9 g/dL — ABNORMAL LOW (ref 12.0–15.0)
MCH: 22.8 pg — ABNORMAL LOW (ref 26.0–34.0)
MCHC: 33.2 g/dL (ref 30.0–36.0)
MCV: 68.6 fL — ABNORMAL LOW (ref 78.0–100.0)
Platelets: 433 10*3/uL — ABNORMAL HIGH (ref 150–400)
RBC: 4.78 MIL/uL (ref 3.87–5.11)
RDW: 15 % (ref 11.5–15.5)
WBC: 10.5 10*3/uL (ref 4.0–10.5)

## 2017-01-04 LAB — LIPASE, BLOOD: Lipase: 28 U/L (ref 11–51)

## 2017-01-04 LAB — PREGNANCY, URINE: PREG TEST UR: NEGATIVE

## 2017-01-04 MED ORDER — MORPHINE SULFATE (PF) 2 MG/ML IV SOLN
4.0000 mg | Freq: Once | INTRAVENOUS | Status: AC
  Administered 2017-01-04: 4 mg via INTRAVENOUS
  Filled 2017-01-04: qty 2

## 2017-01-04 MED ORDER — METHYLPREDNISOLONE SODIUM SUCC 125 MG IJ SOLR
125.0000 mg | Freq: Once | INTRAMUSCULAR | Status: AC
  Administered 2017-01-04: 125 mg via INTRAVENOUS
  Filled 2017-01-04: qty 2

## 2017-01-04 MED ORDER — SODIUM CHLORIDE 0.9 % IV BOLUS (SEPSIS)
1000.0000 mL | Freq: Once | INTRAVENOUS | Status: AC
  Administered 2017-01-04: 1000 mL via INTRAVENOUS

## 2017-01-04 MED ORDER — DIPHENHYDRAMINE HCL 50 MG/ML IJ SOLN
25.0000 mg | Freq: Once | INTRAMUSCULAR | Status: AC
  Administered 2017-01-04: 25 mg via INTRAVENOUS
  Filled 2017-01-04: qty 1

## 2017-01-04 MED ORDER — ONDANSETRON HCL 4 MG/2ML IJ SOLN
4.0000 mg | Freq: Once | INTRAMUSCULAR | Status: AC
  Administered 2017-01-04: 4 mg via INTRAVENOUS
  Filled 2017-01-04: qty 2

## 2017-01-04 MED ORDER — FAMOTIDINE IN NACL 20-0.9 MG/50ML-% IV SOLN
20.0000 mg | Freq: Once | INTRAVENOUS | Status: AC
  Administered 2017-01-04: 20 mg via INTRAVENOUS
  Filled 2017-01-04: qty 50

## 2017-01-04 NOTE — ED Provider Notes (Signed)
[facility]-EMERGENCY DEPT Provider Note   CSN: [id number] Arrival date & time: 01/04/17  1914   By signing my name below, I, Clarisse Gouge, attest that this documentation has been prepared under the direction and in the presence of [facility], PA-C. Electronically Signed: Clarisse Gouge, Scribe. 01/04/17. 11:48 PM.   History   Chief Complaint Chief Complaint  Patient presents with  . Abdominal Pain   The history is provided by the patient and medical records. No language interpreter was used.    HPI Comments: Janice Duarte is a 26 y.o. female who presents to the Emergency Department complaining of 2 episodes vomiting since 12AM today. Pt adds abdominal pain associated with her menstrual period that is worse than usual and nausea waking her from sleep this morning followed by another episode of vomiting. Pt describes 10/10, gradually worsening, constant, cramping, L > R lower abdominal pain with no improving factors and worsened with movment. She states she is unable to tolerate fluids or solids and she believes she is vomiting stomach contents. She reports emesis is NBNB.  No melena or hematochezia. No Hx of similar noted. Pt notes her LNMP began 12/08/2016. Pt denies irregular vaginal discharge, fever, dysuria, abdominal pain before her current menstrual period, recent abx use and recent travel.  Past Medical History:  Diagnosis Date  . Abscess   . Bacterial vaginal infection   . Headaches, cluster   . Scoliosis     There are no active problems to display for this patient.   Past Surgical History:  Procedure Laterality Date  . WISDOM TOOTH EXTRACTION      OB History    No data available       Home Medications    Prior to Admission medications   Medication Sig Start Date End Date Taking? Authorizing Provider  acetaminophen (TYLENOL) 325 MG tablet Take 975 mg by mouth every 3 (three) hours as needed for mild pain, moderate pain, fever or headache.    Yes Historical  Provider, MD  ibuprofen (ADVIL,MOTRIN) 200 MG tablet Take 400-800 mg by mouth every 4 (four) hours as needed for fever, headache, mild pain, moderate pain or cramping.    Yes Historical Provider, MD  diphenhydrAMINE (BENADRYL) 25 MG tablet Take 1 tablet (25 mg total) by mouth every 6 (six) hours as needed for itching (Rash). 01/05/17   Rocklin Soderquist, PA-C  ondansetron (ZOFRAN ODT) 8 MG disintegrating tablet  ODT q4 hours prn nausea 01/05/17   Dierdre Forth, PA-C    Family History Family History  Problem Relation Age of Onset  . Diabetes Other   . Hypertension Other     Social History Social History  Substance Use Topics  . Smoking status: Never Smoker  . Smokeless tobacco: Never Used  . Alcohol use Yes     Comment: socially     Allergies   Penicillins; Morphine and related; and Bactrim [sulfamethoxazole-trimethoprim]   Review of Systems Review of Systems  Constitutional: Negative for fever.  Gastrointestinal: Positive for abdominal pain, nausea and vomiting.  Genitourinary: Positive for vaginal bleeding. Negative for dysuria and vaginal discharge.  All other systems reviewed and are negative.    Physical Exam Updated Vital Signs BP (!) 143/90 (BP Location: Left Arm)   Pulse 76   Temp 98.1 F (36.7 C) (Oral)   Resp 18   Ht  (1.651 m)   Wt 170 lb (77.1 kg)   SpO2 100%   BMI 28.29 kg/m   Physical Exam  Constitutional:  She appears well-developed and well-nourished. No distress.  Awake, alert, nontoxic appearance  HENT:  Head: Normocephalic and atraumatic.  Mouth/Throat: Oropharynx is clear and moist. No oropharyngeal exudate.  Eyes: Conjunctivae are normal. No scleral icterus.  Neck: Normal range of motion. Neck supple.  Cardiovascular: Normal rate, regular rhythm, normal heart sounds and intact distal pulses.   No murmur heard. Pulmonary/Chest: Effort normal and breath sounds normal. No respiratory distress. She has no wheezes.  Equal chest  expansion  Abdominal: Soft. Bowel sounds are normal. She exhibits no distension and no mass. There is tenderness. There is no rebound and no guarding. Hernia confirmed negative in the right inguinal area and confirmed negative in the left inguinal area.  Genitourinary: Uterus normal. No labial fusion. There is no rash, tenderness or lesion on the right labia. There is no rash, tenderness or lesion on the left labia. Uterus is not deviated, not enlarged, not fixed and not tender. Cervix exhibits no motion tenderness, no discharge and no friability. Right adnexum displays no mass, no tenderness and no fullness. Left adnexum displays tenderness. Left adnexum displays no mass and no fullness. There is bleeding ( Small amount) in the vagina. No erythema or tenderness in the vagina. No foreign body in the vagina. No signs of injury around the vagina. No vaginal discharge found.  Genitourinary Comments: Chaperone present  Musculoskeletal: Normal range of motion. She exhibits no edema.  Lymphadenopathy:       Right: No inguinal adenopathy present.       Left: No inguinal adenopathy present.  Neurological: She is alert.  Speech is clear and goal oriented Moves extremities without ataxia  Skin: Skin is warm and dry. She is not diaphoretic. No erythema.  Psychiatric: She has a normal mood and affect.  Nursing note and vitals reviewed.    ED Treatments / Results  DIAGNOSTIC STUDIES: Oxygen Saturation is 100% on RA, normal by my interpretation.    COORDINATION OF CARE: 10:52 PM Discussed treatment plan with pt at bedside and pt agreed to plan. Pt prepared for pelvic exam  Labs (all labs ordered are listed, but only abnormal results are displayed) Labs Reviewed  WET PREP, GENITAL - Abnormal; Notable for the following:       Result Value   Clue Cells Wet Prep HPF POC PRESENT (*)    WBC, Wet Prep HPF POC RARE (*)    All other components within normal limits  COMPREHENSIVE METABOLIC PANEL - Abnormal;  Notable for the following:    Glucose, Bld 100 (*)    Total Protein 8.2 (*)    ALT 12 (*)    All other components within normal limits  CBC - Abnormal; Notable for the following:    Hemoglobin 10.9 (*)    HCT 32.8 (*)    MCV 68.6 (*)    MCH 22.8 (*)    Platelets 433 (*)    All other components within normal limits  URINALYSIS, ROUTINE W REFLEX MICROSCOPIC - Abnormal; Notable for the following:    APPearance HAZY (*)    Hgb urine dipstick LARGE (*)    Squamous Epithelial / LPF 0-5 (*)    All other components within normal limits  LIPASE, BLOOD  PREGNANCY, URINE  GC/CHLAMYDIA PROBE AMP ([place]) NOT AT [facility]     Procedures Procedures (including critical care time)  Medications Ordered in ED Medications  sodium chloride 0.9 % bolus 1,000 mL (0 mLs Intravenous Stopped 01/05/17 0127)  ondansetron (ZOFRAN) injection 4 mg (4  mg Intravenous Given 01/04/17 2309)  morphine 2 MG/ML injection 4 mg (4 mg Intravenous Given 01/04/17 2309)  diphenhydrAMINE (BENADRYL) injection 25 mg (25 mg Intravenous Given 01/04/17 2344)  methylPREDNISolone sodium succinate (SOLU-MEDROL) 125 mg/2 mL injection 125 mg (125 mg Intravenous Given 01/04/17 2342)  famotidine (PEPCID) IVPB 20 mg premix (0 mg Intravenous Stopped 01/05/17 0104)     Initial Impression / Assessment and Plan / ED Course  I have reviewed the triage vital signs and the nursing notes.  Pertinent labs & imaging results that were available during my care of the patient were reviewed by me and considered in my medical decision making (see chart for details).  Clinical Course as of Jan 06 147  Wynelle Link Jan 04, 2017  2333 Pt c/o itching, erythema, hives and warmth of the right arm at the site of the IV and traveling upward. We'll treat for allergic reaction.  [HM]  Mon Jan 05, 2017  0136 Complete resolution of allergic reaction, pt remains without development of airway involvement  [HM]  0145 On repeat exam, patient's abdomen remains soft and is now  nontender. She has tolerated by mouth without difficulty.  [HM]    Clinical Course User Index [HM] Dahlia Client Tamicka Shimon, PA-C     Patient is nontoxic, nonseptic appearing, in no apparent distress.  Patient's pain and other symptoms adequately managed in emergency department.  Fluid bolus given.  Labs, imaging and vitals reviewed.  Patient does not meet the SIRS or Sepsis criteria.  On repeat exam patient does not have a surgical abdomin and there are no peritoneal signs.  No indication of appendicitis, bowel obstruction, bowel perforation, cholecystitis, diverticulitis.  Patient does have left adnexal tenderness on evaluation.  No cervical motion tenderness. Patient denies vaginal discharge and no purulent discharge is noted on my exam. Discussed with patient the possibility of infection or ovarian torsion due to site of pain.  I have recommended pelvic ultrasound. Patient declines.  I have discussed the risk of missed ovarian torsion or other pelvic etiology without the imaging. Patient states understanding and wishes to decline. She states that she is exhausted and her pain has resolved and does not want the ultrasound.  She is competent to make this decision.  Friend in the room is unable to persuade pt to stay.   Patient discharged home with symptomatic treatment and given strict instructions for follow-up with their primary care physician and OB/GYN.  I have also discussed reasons to return immediately to the ER.  I have informed the patient that she may return at any time for further evaluation. Patient expresses understanding and agrees with plan.  During her course here, pt developed hives and erythema to the right arm after zofran and morphine administration. She reports taking zofran numerous time without incident, but that morphine often makes her itch.  IV site was moved and pt treated with Benadryl, pepcid and solumedrol with complete resolution of the symptoms.  Patient re-evaluated prior to  dc, is hemodynamically stable, in no respiratory distress, and denies the feeling of throat closing. Pt has been advised to take OTC benadryl & return to the ED if they have a mod-severe allergic rxn (s/s including throat closing, difficulty breathing, swelling of lips face or tongue). Pt is to follow up with their PCP. Pt is agreeable with plan & verbalizes understanding.   Final Clinical Impressions(s) / ED Diagnoses   Final diagnoses:  Lower abdominal pain  Non-intractable vomiting with nausea, unspecified vomiting type  Vaginal bleeding  Allergic reaction, initial encounter    New Prescriptions Discharge Medication List as of 01/05/2017  1:35 AM    START taking these medications   Details  ondansetron (ZOFRAN ODT) 8 MG disintegrating tablet  ODT q4 hours prn nausea, Print       I personally performed the services described in this documentation, which was scribed in my presence. The recorded information has been reviewed and is accurate.    Dahlia Client Ashani Pumphrey, PA-C 01/05/17 0148    Arby Barrette, MD 01/12/17 [phone]

## 2017-01-04 NOTE — ED Triage Notes (Signed)
Lower abdominal pain started last night with vomiting since last night unable to keep anything down, states on menstral cycle at this time. No fever or dysuria voiced.

## 2017-01-05 LAB — GC/CHLAMYDIA PROBE AMP (~~LOC~~) NOT AT ARMC
CHLAMYDIA, DNA PROBE: NEGATIVE
Neisseria Gonorrhea: NEGATIVE

## 2017-01-05 LAB — WET PREP, GENITAL
SPERM: NONE SEEN
TRICH WET PREP: NONE SEEN
YEAST WET PREP: NONE SEEN

## 2017-01-05 MED ORDER — DIPHENHYDRAMINE HCL 25 MG PO TABS: 25.0000 mg | ORAL_TABLET | Freq: Four times a day (QID) | ORAL | 0 refills | Status: DC | PRN

## 2017-01-05 MED ORDER — ONDANSETRON 8 MG PO TBDP: ORAL_TABLET | ORAL | 0 refills | Status: DC

## 2017-01-05 NOTE — Discharge Instructions (Signed)
1. Medications: Ibuprofen for menstrual cramps, zofran for vomiting, benadryl for return of allergic reaction symptoms, usual home medications 2. Treatment: rest, drink plenty of fluids,  3. Follow Up: Please followup with your primary doctor or OB/GYN in 2-3 days for discussion of your diagnoses and further evaluation after today's visit; if you do not have a primary care doctor use the resource guide provided to find one; Please return to the ER for worsening pain, persistent vomiting, development of fevers, return or worsening rash, difficulty breathing, vomiting or other concerns

## 2017-02-20 ENCOUNTER — Encounter (HOSPITAL_COMMUNITY): Payer: Self-pay | Admitting: Emergency Medicine

## 2017-02-20 ENCOUNTER — Emergency Department (HOSPITAL_COMMUNITY)
Admission: EM | Admit: 2017-02-20 | Discharge: 2017-02-20 | Disposition: A | Discharge status: Home / Self Care | Payer: Medicaid Other | Attending: Emergency Medicine | Admitting: Emergency Medicine

## 2017-02-20 DIAGNOSIS — G8929 Other chronic pain: Secondary | ICD-10-CM

## 2017-02-20 DIAGNOSIS — M545 Low back pain: Secondary | ICD-10-CM | POA: Insufficient documentation

## 2017-02-20 DIAGNOSIS — M549 Dorsalgia, unspecified: Secondary | ICD-10-CM

## 2017-02-20 MED ORDER — BACLOFEN 10 MG PO TABS: 10.0000 mg | ORAL_TABLET | Freq: Three times a day (TID) | ORAL | 0 refills | Status: DC

## 2017-02-20 NOTE — ED Triage Notes (Signed)
Pt reports lower back pain , no fall nor injury. Hx scoliosis. denies urinary symptoms today . 

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
## 2017-02-20 NOTE — Discharge Instructions (Signed)
Please read attached information. If you experience any new or worsening signs or symptoms please return to the emergency room for evaluation. Please follow-up with your primary care provider or specialist as discussed. Please use medication prescribed only as directed and discontinue taking if you have any concerning signs or symptoms.   °

## 2017-05-10 DIAGNOSIS — Z5321 Procedure and treatment not carried out due to patient leaving prior to being seen by health care provider: Secondary | ICD-10-CM | POA: Insufficient documentation

## 2017-05-10 DIAGNOSIS — N6323 Unspecified lump in the left breast, lower outer quadrant: Secondary | ICD-10-CM | POA: Insufficient documentation

## 2017-05-11 ENCOUNTER — Emergency Department (HOSPITAL_COMMUNITY)
Admission: EM | Admit: 2017-05-11 | Discharge: 2017-05-11 | Disposition: A | Discharge status: Home / Self Care | Payer: Medicaid Other | Attending: Emergency Medicine | Admitting: Emergency Medicine

## 2017-05-11 ENCOUNTER — Encounter (HOSPITAL_COMMUNITY): Payer: Self-pay

## 2017-05-11 LAB — CBC
HCT: 36.2 % (ref 36.0–46.0)
Hemoglobin: 12.7 g/dL (ref 12.0–15.0)
MCH: 24.1 pg — AB (ref 26.0–34.0)
MCHC: 35.1 g/dL (ref 30.0–36.0)
MCV: 68.7 fL — AB (ref 78.0–100.0)
PLATELETS: 410 10*3/uL — AB (ref 150–400)
RBC: 5.27 MIL/uL — ABNORMAL HIGH (ref 3.87–5.11)
RDW: 16.6 % — AB (ref 11.5–15.5)
WBC: 21.5 10*3/uL — AB (ref 4.0–10.5)

## 2017-05-11 LAB — COMPREHENSIVE METABOLIC PANEL
ALK PHOS: 61 U/L (ref 38–126)
ALT: 14 U/L (ref 14–54)
AST: 22 U/L (ref 15–41)
Albumin: 4.8 g/dL (ref 3.5–5.0)
Anion gap: 9 (ref 5–15)
BUN: 11 mg/dL (ref 6–20)
CALCIUM: 9.9 mg/dL (ref 8.9–10.3)
CO2: 25 mmol/L (ref 22–32)
CREATININE: 0.75 mg/dL (ref 0.44–1.00)
Chloride: 104 mmol/L (ref 101–111)
Glucose, Bld: 90 mg/dL (ref 65–99)
Potassium: 3.7 mmol/L (ref 3.5–5.1)
Sodium: 138 mmol/L (ref 135–145)
Total Bilirubin: 0.6 mg/dL (ref 0.3–1.2)
Total Protein: 9.6 g/dL — ABNORMAL HIGH (ref 6.5–8.1)

## 2017-05-11 LAB — URINALYSIS, ROUTINE W REFLEX MICROSCOPIC
BILIRUBIN URINE: NEGATIVE
Bacteria, UA: NONE SEEN
Glucose, UA: NEGATIVE mg/dL
Hgb urine dipstick: NEGATIVE
Ketones, ur: NEGATIVE mg/dL
Nitrite: NEGATIVE
PH: 5 (ref 5.0–8.0)
Protein, ur: NEGATIVE mg/dL
SPECIFIC GRAVITY, URINE: 1.021 (ref 1.005–1.030)

## 2017-05-11 LAB — POC URINE PREG, ED: PREG TEST UR: NEGATIVE

## 2017-05-11 LAB — LIPASE, BLOOD: LIPASE: 30 U/L (ref 11–51)

## 2017-05-11 NOTE — ED Triage Notes (Signed)
States acid reflux since yesterday and woke up today took antacid with black stool noted when went to bathroom.  Lump under left breast noticed this am.

## 2017-05-13 ENCOUNTER — Emergency Department (HOSPITAL_COMMUNITY): Payer: Self-pay

## 2017-05-13 ENCOUNTER — Encounter (HOSPITAL_COMMUNITY): Payer: Self-pay | Admitting: Emergency Medicine

## 2017-05-13 ENCOUNTER — Emergency Department (HOSPITAL_COMMUNITY)
Admission: EM | Admit: 2017-05-13 | Discharge: 2017-05-13 | Disposition: A | Discharge status: Home / Self Care | Payer: Self-pay | Attending: Emergency Medicine | Admitting: Emergency Medicine

## 2017-05-13 DIAGNOSIS — B9689 Other specified bacterial agents as the cause of diseases classified elsewhere: Secondary | ICD-10-CM | POA: Insufficient documentation

## 2017-05-13 DIAGNOSIS — N760 Acute vaginitis: Secondary | ICD-10-CM | POA: Insufficient documentation

## 2017-05-13 DIAGNOSIS — K5904 Chronic idiopathic constipation: Secondary | ICD-10-CM | POA: Insufficient documentation

## 2017-05-13 LAB — WET PREP, GENITAL
Sperm: NONE SEEN
TRICH WET PREP: NONE SEEN
YEAST WET PREP: NONE SEEN

## 2017-05-13 LAB — COMPREHENSIVE METABOLIC PANEL
ALK PHOS: 58 U/L (ref 38–126)
ALT: 13 U/L — AB (ref 14–54)
AST: 19 U/L (ref 15–41)
Albumin: 4.1 g/dL (ref 3.5–5.0)
Anion gap: 10 (ref 5–15)
BILIRUBIN TOTAL: 0.6 mg/dL (ref 0.3–1.2)
BUN: 8 mg/dL (ref 6–20)
CO2: 24 mmol/L (ref 22–32)
CREATININE: 0.73 mg/dL (ref 0.44–1.00)
Calcium: 9.2 mg/dL (ref 8.9–10.3)
Chloride: 102 mmol/L (ref 101–111)
GFR calc Af Amer: >60 mL/min (ref >60)
Glucose, Bld: 104 mg/dL — ABNORMAL HIGH (ref 65–99)
Potassium: 3.9 mmol/L (ref 3.5–5.1)
Sodium: 136 mmol/L (ref 135–145)
TOTAL PROTEIN: 8.5 g/dL — AB (ref 6.5–8.1)

## 2017-05-13 LAB — URINALYSIS, ROUTINE W REFLEX MICROSCOPIC
Bilirubin Urine: NEGATIVE
GLUCOSE, UA: NEGATIVE mg/dL
Hgb urine dipstick: NEGATIVE
KETONES UR: NEGATIVE mg/dL
LEUKOCYTES UA: NEGATIVE
NITRITE: NEGATIVE
PROTEIN: NEGATIVE mg/dL
Specific Gravity, Urine: 1.005 (ref 1.005–1.030)
pH: 6 (ref 5.0–8.0)

## 2017-05-13 LAB — CBC
HCT: 33.1 % — ABNORMAL LOW (ref 36.0–46.0)
Hemoglobin: 11.3 g/dL — ABNORMAL LOW (ref 12.0–15.0)
MCH: 23.8 pg — ABNORMAL LOW (ref 26.0–34.0)
MCHC: 34.1 g/dL (ref 30.0–36.0)
MCV: 69.7 fL — ABNORMAL LOW (ref 78.0–100.0)
PLATELETS: 421 10*3/uL — AB (ref 150–400)
RBC: 4.75 MIL/uL (ref 3.87–5.11)
RDW: 16.6 % — AB (ref 11.5–15.5)
WBC: 14.2 10*3/uL — AB (ref 4.0–10.5)

## 2017-05-13 LAB — LIPASE, BLOOD: Lipase: 26 U/L (ref 11–51)

## 2017-05-13 LAB — POC URINE PREG, ED: Preg Test, Ur: NEGATIVE

## 2017-05-13 MED ORDER — IOPAMIDOL (ISOVUE-300) INJECTION 61%
100.0000 mL | Freq: Once | INTRAVENOUS | Status: AC | PRN
  Administered 2017-05-13: 100 mL via INTRAVENOUS

## 2017-05-13 MED ORDER — DOCUSATE SODIUM 250 MG PO CAPS: 250.0000 mg | ORAL_CAPSULE | Freq: Every day | ORAL | 0 refills | Status: AC

## 2017-05-13 MED ORDER — METRONIDAZOLE 500 MG PO TABS: 500.0000 mg | ORAL_TABLET | Freq: Two times a day (BID) | ORAL | 0 refills | Status: AC

## 2017-05-13 MED ORDER — IOPAMIDOL (ISOVUE-300) INJECTION 61%
INTRAVENOUS | Status: AC
  Filled 2017-05-13: qty 100

## 2017-05-13 NOTE — ED Provider Notes (Signed)
[facility]-EMERGENCY DEPT Provider Note   CSN: [id number] Arrival date & time: 05/13/17  1203     History   Chief Complaint Chief Complaint  Patient presents with  . Dysuria    HPI Janice Duarte is a 26 y.o. female.  The history is provided by the patient.  Dysuria   This is a new problem. The current episode started 2 days ago. The problem occurs every urination. The problem has not changed since onset.The quality of the pain is described as burning. The pain is mild. There has been no fever. Associated symptoms include frequency and flank pain. Pertinent negatives include no chills, no nausea, no vomiting, no discharge and no hematuria. She has tried nothing for the symptoms. Her past medical history does not include recurrent UTIs.   LMP: 04/12/17  Reports 2 days of vaginal discharge. Prior STI yrs ago. monogamous with on e partner. Trying to conceive.   Past Medical History:  Diagnosis Date  . Abscess   . Bacterial vaginal infection   . Headaches, cluster   . Scoliosis     There are no active problems to display for this patient.   Past Surgical History:  Procedure Laterality Date  . WISDOM TOOTH EXTRACTION      OB History    No data available       Home Medications    Prior to Admission medications   Medication Sig Start Date End Date Taking? Authorizing Provider  baclofen (LIORESAL) 10 MG tablet Take 1 tablet (10 mg total) by mouth 3 (three) times daily. Patient not taking: Reported on 05/13/2017 02/20/17   Hedges, Tinnie Gens, PA-C  diphenhydrAMINE (BENADRYL) 25 MG tablet Take 1 tablet (25 mg total) by mouth every 6 (six) hours as needed for itching (Rash). Patient not taking: Reported on 05/13/2017 01/05/17   Muthersbaugh, Dahlia Client, PA-C  ondansetron (ZOFRAN ODT) 8 MG disintegrating tablet 8mg  ODT q4 hours prn nausea Patient not taking: Reported on 05/13/2017 01/05/17   Muthersbaugh, Dahlia Client, PA-C    Family History Family History  Problem Relation Age of Onset    . Diabetes Other   . Hypertension Other     Social History Social History  Substance Use Topics  . Smoking status: Never Smoker  . Smokeless tobacco: Never Used  . Alcohol use Yes     Comment: socially     Allergies   Penicillins; Morphine and related; and Bactrim [sulfamethoxazole-trimethoprim]   Review of Systems Review of Systems  Constitutional: Negative for chills.  Gastrointestinal: Negative for nausea and vomiting.  Genitourinary: Positive for dysuria, flank pain and frequency. Negative for hematuria.     Physical Exam Updated Vital Signs BP 137/84 (BP Location: Left Arm)   Pulse 74   Temp 99.2 F (37.3 C) (Oral)   Resp 15   Ht 5' 5.5" (1.664 m)   Wt 80 kg (176 lb 6 oz)   LMP 04/12/2017   SpO2 100%   BMI 28.90 kg/m   Physical Exam  Constitutional: She is oriented to person, place, and time. She appears well-developed and well-nourished. No distress.  HENT:  Head: Normocephalic and atraumatic.  Nose: Nose normal.  Eyes: Pupils are equal, round, and reactive to light. Conjunctivae and EOM are normal. Right eye exhibits no discharge. Left eye exhibits no discharge. No scleral icterus.  Neck: Normal range of motion. Neck supple.  Cardiovascular: Normal rate and regular rhythm.  Exam reveals no gallop and no friction rub.   No murmur heard. Pulmonary/Chest: Effort normal and  breath sounds normal. No stridor. No respiratory distress. She has no rales.  Abdominal: Soft. She exhibits no distension. There is tenderness in the suprapubic area and left lower quadrant. There is CVA tenderness (left). There is no rigidity, no guarding and negative Murphy's sign.  Genitourinary: Pelvic exam was performed with patient supine. Uterus is not tender. Cervix exhibits no motion tenderness, no discharge and no friability. Right adnexum displays no tenderness. Left adnexum displays no tenderness. No erythema or bleeding in the vagina. No foreign body in the vagina. Vaginal  discharge (thick and white) found.  Genitourinary Comments: Chaperone present during pelvic exam.   Musculoskeletal: She exhibits no edema or tenderness.  Neurological: She is alert and oriented to person, place, and time.  Skin: Skin is warm and dry. No rash noted. She is not diaphoretic. No erythema.  Psychiatric: She has a normal mood and affect.  Vitals reviewed.    ED Treatments / Results  Labs (all labs ordered are listed, but only abnormal results are displayed) Labs Reviewed  WET PREP, GENITAL - Abnormal; Notable for the following:       Result Value   Clue Cells Wet Prep HPF POC PRESENT (*)    WBC, Wet Prep HPF POC MANY (*)    All other components within normal limits  COMPREHENSIVE METABOLIC PANEL - Abnormal; Notable for the following:    Glucose, Bld 104 (*)    Total Protein 8.5 (*)    ALT 13 (*)    All other components within normal limits  CBC - Abnormal; Notable for the following:    WBC 14.2 (*)    Hemoglobin 11.3 (*)    HCT 33.1 (*)    MCV 69.7 (*)    MCH 23.8 (*)    RDW 16.6 (*)    Platelets 421 (*)    All other components within normal limits  URINALYSIS, ROUTINE W REFLEX MICROSCOPIC - Abnormal; Notable for the following:    APPearance HAZY (*)    All other components within normal limits  LIPASE, BLOOD  POC URINE PREG, ED  GC/CHLAMYDIA PROBE AMP ([place]) [facility]    EKG  EKG Interpretation None       Radiology   Procedures Procedures (including critical care time)  Medications Ordered in ED Medications  iopamidol (ISOVUE-300) 61 % injection (not administered)  iopamidol (ISOVUE-300) 61 % injection 100 mL (100 mLs Intravenous Contrast Given 05/13/17 1641)     Initial Impression / Assessment and Plan / ED Course  I have reviewed the triage vital signs and the nursing notes.  Pertinent labs & imaging results that were available during my care of the patient were reviewed by me and considered in my medical decision making (see  chart for details).     Left flank and left lower quadrant tenderness. With leukocytosis. Pelvic exam concerning for bacterial vaginosis versus yeast infection versus Trichomonas. No evidence of cervicitis or PID. Low suspicion for TOA. UPT negative. We will require coverage with Flagyl.  UA without evidence of infection.  Given the significant left flank and left lower quadrant tenderness without leukocytosis, we'll obtain a CT to assess for any serious intra-abdominal inflammatory/infectious process.  Patient care turned over to Dr Erma HeritageIsaacs at 1700. Patient case and results discussed in detail; please see their note for further ED managment.       Nira Connardama, Shuntell Foody Eduardo, MD 05/13/17 [phone]

## 2017-05-13 NOTE — ED Provider Notes (Signed)
Assumed care from Dr. Eudelia Bunchardama at 4 Pm. Briefly, the patient is a 26 y.o. female with PMHx of  has a past medical history of Abscess; Bacterial vaginal infection; Headaches, cluster; and Scoliosis. here with LLQ pain and vaginal discharge. Wet prep + for BV. No apparent CMT on exam per Dr. Eudelia Bunchardama. CT scan is pending.   Labs Reviewed  WET PREP, GENITAL - Abnormal; Notable for the following:       Result Value   Clue Cells Wet Prep HPF POC PRESENT (*)    WBC, Wet Prep HPF POC MANY (*)    All other components within normal limits  COMPREHENSIVE METABOLIC PANEL - Abnormal; Notable for the following:    Glucose, Bld 104 (*)    Total Protein 8.5 (*)    ALT 13 (*)    All other components within normal limits  CBC - Abnormal; Notable for the following:    WBC 14.2 (*)    Hemoglobin 11.3 (*)    HCT 33.1 (*)    MCV 69.7 (*)    MCH 23.8 (*)    RDW 16.6 (*)    Platelets 421 (*)    All other components within normal limits  URINALYSIS, ROUTINE W REFLEX MICROSCOPIC - Abnormal; Notable for the following:    APPearance HAZY (*)    All other components within normal limits  LIPASE, BLOOD  POC URINE PREG, ED  GC/CHLAMYDIA PROBE AMP ([place]) [facility]    Course of Care: -CT scan is negative for acute antibody. Patient does appear constipated. On my assessment, the patient is well-appearing and in no acute distress. Will treat her for bacterial vaginosis. Otherwise, will also start her on a stool softener as a suspected constipation is contributing to her symptoms. Her vital signs are stable. I also discussed her chronic leukocytosis and she can follow-up as an outpatient. No evidence of lymphoma or leukemia.     Shaune PollackIsaacs, Jearl Soto, MD 05/13/17 [phone]

## 2017-05-13 NOTE — ED Triage Notes (Signed)
Pt c/o sharp pain at urethra at end of voiding, thickened non-odorous vaginal discharge, intermittent LLQ abdominal pain, low back pain x 2 days. No vaginal bleeding, nausea, emesis. Pt attempting to conceive. G0P0.

## 2017-05-14 LAB — GC/CHLAMYDIA PROBE AMP (~~LOC~~) NOT AT ARMC
Chlamydia: NEGATIVE
Neisseria Gonorrhea: NEGATIVE

## 2017-07-01 ENCOUNTER — Inpatient Hospital Stay (HOSPITAL_COMMUNITY)
Admission: AD | Admit: 2017-07-01 | Discharge: 2017-07-01 | Disposition: A | Discharge status: Home / Self Care | Payer: Self-pay | Source: Ambulatory Visit | Attending: Obstetrics & Gynecology | Admitting: Obstetrics & Gynecology

## 2017-07-01 ENCOUNTER — Encounter (HOSPITAL_COMMUNITY): Payer: Self-pay | Admitting: *Deleted

## 2017-07-01 DIAGNOSIS — N938 Other specified abnormal uterine and vaginal bleeding: Secondary | ICD-10-CM

## 2017-07-01 DIAGNOSIS — M419 Scoliosis, unspecified: Secondary | ICD-10-CM | POA: Insufficient documentation

## 2017-07-01 DIAGNOSIS — N760 Acute vaginitis: Secondary | ICD-10-CM | POA: Insufficient documentation

## 2017-07-01 DIAGNOSIS — N946 Dysmenorrhea, unspecified: Secondary | ICD-10-CM

## 2017-07-01 DIAGNOSIS — K5900 Constipation, unspecified: Secondary | ICD-10-CM

## 2017-07-01 DIAGNOSIS — B9689 Other specified bacterial agents as the cause of diseases classified elsewhere: Secondary | ICD-10-CM

## 2017-07-01 DIAGNOSIS — Z79899 Other long term (current) drug therapy: Secondary | ICD-10-CM | POA: Insufficient documentation

## 2017-07-01 LAB — WET PREP, GENITAL
Sperm: NONE SEEN
TRICH WET PREP: NONE SEEN
YEAST WET PREP: NONE SEEN

## 2017-07-01 LAB — URINALYSIS, ROUTINE W REFLEX MICROSCOPIC
BILIRUBIN URINE: NEGATIVE
Glucose, UA: NEGATIVE mg/dL
Ketones, ur: NEGATIVE mg/dL
Nitrite: NEGATIVE
PH: 5 (ref 5.0–8.0)
PROTEIN: 30 mg/dL — AB
Specific Gravity, Urine: 1.019 (ref 1.005–1.030)

## 2017-07-01 LAB — CBC
HCT: 34.4 % — ABNORMAL LOW (ref 36.0–46.0)
HEMOGLOBIN: 11.5 g/dL — AB (ref 12.0–15.0)
MCH: 23.6 pg — AB (ref 26.0–34.0)
MCHC: 33.4 g/dL (ref 30.0–36.0)
MCV: 70.5 fL — AB (ref 78.0–100.0)
Platelets: 472 10*3/uL — ABNORMAL HIGH (ref 150–400)
RBC: 4.88 MIL/uL (ref 3.87–5.11)
RDW: 15.3 % (ref 11.5–15.5)
WBC: 10.8 10*3/uL — ABNORMAL HIGH (ref 4.0–10.5)

## 2017-07-01 LAB — HEPATITIS B SURFACE ANTIGEN: Hepatitis B Surface Ag: NEGATIVE

## 2017-07-01 LAB — POCT PREGNANCY, URINE: PREG TEST UR: NEGATIVE

## 2017-07-01 NOTE — Discharge Instructions (Signed)
Abnormal Uterine Bleeding Abnormal uterine bleeding means bleeding more than usual from your uterus. It can include:  Bleeding between periods.  Bleeding after sex.  Bleeding that is heavier than normal.  Periods that last longer than usual.  Bleeding after you have stopped having your period (menopause).  There are many problems that may cause this. You should see a doctor for any kind of bleeding that is not normal. Treatment depends on the cause of the bleeding. Follow these instructions at home:  Watch your condition for any changes.  Do not use tampons, douche, or have sex, if your doctor tells you not to.  Change your pads often.  Get regular well-woman exams. Make sure they include a pelvic exam and cervical cancer screening.  Keep all follow-up visits as told by your doctor. This is important. Contact a doctor if:  The bleeding lasts more than one week.  You feel dizzy at times.  You feel like you are going to throw up (nauseous).  You throw up. Get help right away if:  You pass out.  You have to change pads every hour.  You have belly (abdominal) pain.  You have a fever.  You get sweaty.  You get weak.  You passing large blood clots from your vagina. Summary  Abnormal uterine bleeding means bleeding more than usual from your uterus.  There are many problems that may cause this. You should see a doctor for any kind of bleeding that is not normal.  Treatment depends on the cause of the bleeding. This information is not intended to replace advice given to you by your health care provider. Make sure you discuss any questions you have with your health care provider. Document Released: 07/13/2009 Document Revised: 09/09/2016 Document Reviewed: 09/09/2016 Elsevier Interactive Patient Education  2017 Else[employer] Inc. Probiotics What are probiotics? Probiotics are the good bacteria and yeasts that live in your body and keep you and your digestive system  healthy. Probiotics also help your body's defense (immune) system and protect your body against bad bacterial growth. Certain foods contain probiotics, such as yogurt. Probiotics can also be purchased as a supplement. As with any supplement or drug, it is important to discuss its use with your health care provider. What affects the balance of bacteria in my body? The balance of bacteria in your body can be affected by:  Antibiotic medicines. Antibiotics are sometimes necessary to treat infection. Unfortunately, they may kill good or friendly bacteria in your body as well as the bad bacteria. This may lead to stomach problems like diarrhea, gas, and cramping.  Disease. Some conditions are the result of an overgrowth of bad bacteria, yeasts, parasites, or fungi. These conditions include: ? Infectious diarrhea. ? Stomach and respiratory infections. ? Skin infections. ? Irritable bowel syndrome (IBS). ? Inflammatory bowel diseases. ? Ulcer due to Helicobacter pylori (H. pylori) infection. ? Tooth decay and periodontal disease. ? Vaginal infections.  Stress and poor diet may also lower the good bacteria in your body. What type of probiotic is right for me? Probiotics are available over the counter at your local pharmacy, health food, or grocery store. They come in many different forms, combinations of strains, and dosing strengths. Some may need to be refrigerated. Always read the label for storage and usage instructions. Specific strains have been shown to be more effective for certain conditions. Ask your health care provider what option is best for you. Why would I need probiotics? There are many reasons your health care  provider might recommend a probiotic supplement, including:  Diarrhea.  Constipation.  IBS.  Respiratory infections.  Yeast infections.  Acne, eczema, and other skin conditions.  Frequent urinary tract infections (UTIs).  Are there side effects of  probiotics? Some people experience mild side effects when taking probiotics. Side effects are usually temporary and may include:  Gas.  Bloating.  Cramping.  Rarely, serious side effects, such as infection or immune system changes, may occur. What else do I need to know about probiotics?  There are many different strains of probiotics. Certain strains may be more effective depending on your condition. Probiotics are available in varying doses. Ask your health care provider which probiotic you should use and how often.  If you are taking probiotics along with antibiotics, it is generally recommended to wait at least 2 hours between taking the antibiotic and taking the probiotic. For more information: [facility] for Complementary and Alternative Medicine http://potts.com/ This information is not intended to replace advice given to you by your health care provider. Make sure you discuss any questions you have with your health care provider. Document Released: 04/12/2014 Document Revised: 08/12/2016 Document Reviewed: 12/13/2013 Elsevier Interactive Patient Education  2017 [employer].  Constipation, Adult Constipation is when a person has fewer bowel movements in a week than normal, has difficulty having a bowel movement, or has stools that are dry, hard, or larger than normal. Constipation may be caused by an underlying condition. It may become worse with age if a person takes certain medicines and does not take in enough fluids. Follow these instructions at home: Eating and drinking   Eat foods that have a lot of fiber, such as fresh fruits and vegetables, whole grains, and beans.  Limit foods that are high in fat, low in fiber, or overly processed, such as french fries, hamburgers, cookies, candies, and soda.  Drink enough fluid to keep your urine clear or pale yellow. General instructions  Exercise regularly or as told by your health care provider.  Go to the restroom  when you have the urge to go. Do not hold it in.  Take over-the-counter and prescription medicines only as told by your health care provider. These include any fiber supplements.  Practice pelvic floor retraining exercises, such as deep breathing while relaxing the lower abdomen and pelvic floor relaxation during bowel movements.  Watch your condition for any changes.  Keep all follow-up visits as told by your health care provider. This is important. Contact a health care provider if:  You have pain that gets worse.  You have a fever.  You do not have a bowel movement after 4 days.  You vomit.  You are not hungry.  You lose weight.  You are bleeding from the anus.  You have thin, pencil-like stools. Get help right away if:  You have a fever and your symptoms suddenly get worse.  You leak stool or have blood in your stool.  Your abdomen is bloated.  You have severe pain in your abdomen.  You feel dizzy or you faint. This information is not intended to replace advice given to you by your health care provider. Make sure you discuss any questions you have with your health care provider. Document Released: 06/13/2004 Document Revised: 04/04/2016 Document Reviewed: 03/05/2016 Elsevier Interactive Patient Education  2017 Elsevier Inc.  Bacterial Vaginosis Bacterial vaginosis is a vaginal infection that occurs when the normal balance of bacteria in the vagina is disrupted. It results from an overgrowth of certain  bacteria. This is the most common vaginal infection among women ages 2-44. Because bacterial vaginosis increases your risk for STIs (sexually transmitted infections), getting treated can help reduce your risk for chlamydia, gonorrhea, herpes, and HIV (human immunodeficiency virus). Treatment is also important for preventing complications in pregnant women, because this condition can cause an early (premature) delivery. What are the causes? This condition is caused by  an increase in harmful bacteria that are normally present in small amounts in the vagina. However, the reason that the condition develops is not fully understood. What increases the risk? The following factors may make you more likely to develop this condition:  Having a new sexual partner or multiple sexual partners.  Having unprotected sex.  Douching.  Having an intrauterine device (IUD).  Smoking.  Drug and alcohol abuse.  Taking certain antibiotic medicines.  Being pregnant.  You cannot get bacterial vaginosis from toilet seats, bedding, swimming pools, or contact with objects around you. What are the signs or symptoms? Symptoms of this condition include:  Grey or white vaginal discharge. The discharge can also be watery or foamy.  A fish-like odor with discharge, especially after sexual intercourse or during menstruation.  Itching in and around the vagina.  Burning or pain with urination.  Some women with bacterial vaginosis have no signs or symptoms. How is this diagnosed? This condition is diagnosed based on:  Your medical history.  A physical exam of the vagina.  Testing a sample of vaginal fluid under a microscope to look for a large amount of bad bacteria or abnormal cells. Your health care provider may use a cotton swab or a small wooden spatula to collect the sample.  How is this treated? This condition is treated with antibiotics. These may be given as a pill, a vaginal cream, or a medicine that is put into the vagina (suppository). If the condition comes back after treatment, a second round of antibiotics may be needed. Follow these instructions at home: Medicines  Take over-the-counter and prescription medicines only as told by your health care provider.  Take or use your antibiotic as told by your health care provider. Do not stop taking or using the antibiotic even if you start to feel better. General instructions  If you have a female sexual  partner, tell her that you have a vaginal infection. She should see her health care provider and be treated if she has symptoms. If you have a female sexual partner, he does not need treatment.  During treatment: ? Avoid sexual activity until you finish treatment. ? Do not douche. ? Avoid alcohol as directed by your health care provider. ? Avoid breastfeeding as directed by your health care provider.  Drink enough water and fluids to keep your urine clear or pale yellow.  Keep the area around your vagina and rectum clean. ? Wash the area daily with warm water. ? Wipe yourself from front to back after using the toilet.  Keep all follow-up visits as told by your health care provider. This is important. How is this prevented?  Do not douche.  Wash the outside of your vagina with warm water only.  Use protection when having sex. This includes latex condoms and dental dams.  Limit how many sexual partners you have. To help prevent bacterial vaginosis, it is best to have sex with just one partner (monogamous).  Make sure you and your sexual partner are tested for STIs.  Wear cotton or cotton-lined underwear.  Avoid wearing tight pants and  pantyhose, especially during summer.  Limit the amount of alcohol that you drink.  Do not use any products that contain nicotine or tobacco, such as cigarettes and e-cigarettes. If you need help quitting, ask your health care provider.  Do not use illegal drugs. Where to find more information:  Centers for Disease Control and Prevention: SolutionApps.co.za  American Sexual Health Association (ASHA): www.ashastd.org  U.S. Department of Health and Health and safety inspector, Office on Women's Health: ConventionalMedicines.si or http://www.anderson-williamson.info/ Contact a health care provider if:  Your symptoms do not improve, even after treatment.  You have more discharge or pain when urinating.  You have a fever.  You have pain in  your abdomen.  You have pain during sex.  You have vaginal bleeding between periods. Summary  Bacterial vaginosis is a vaginal infection that occurs when the normal balance of bacteria in the vagina is disrupted.  Because bacterial vaginosis increases your risk for STIs (sexually transmitted infections), getting treated can help reduce your risk for chlamydia, gonorrhea, herpes, and HIV (human immunodeficiency virus). Treatment is also important for preventing complications in pregnant women, because the condition can cause an early (premature) delivery.  This condition is treated with antibiotic medicines. These may be given as a pill, a vaginal cream, or a medicine that is put into the vagina (suppository). This information is not intended to replace advice given to you by your health care provider. Make sure you discuss any questions you have with your health care provider. Document Released: 09/15/2005 Document Revised: 05/31/2016 Document Reviewed: 05/31/2016 Elsevier Interactive Patient Education  2017 [employer].

## 2017-07-01 NOTE — MAU Note (Signed)
Was dx and treated for BV in Sept.  Still has the d/c. Today she saw blood when she wiped and she has already had her period. Pain in lower abd and back, started last night.

## 2017-07-01 NOTE — MAU Provider Note (Signed)
Chief Complaint:  Vaginal Bleeding and Vaginal Discharge   First Provider Initiated Contact with Patient 07/01/17 1707       HPI: Janice Duarte is a 26 y.o. who presents to maternity admissions reporting bleeding two weeks after last normal period.  Also has some cramping in lower abdomen and low back.   Did vomit once yesterday. .Has vaginal discharge with odor, despite taking Flagyl. Had a CT in August to work up pain and it was negative except for constipation. She reports vaginal bleeding, but no vaginal itching/burning, urinary symptoms, h/a, dizziness, n/v, or fever/chills.    Vaginal Bleeding  This is a new problem. The current episode started today. The problem occurs intermittently. The problem has been unchanged. The pain is mild. The problem affects both sides. She is not pregnant. Associated symptoms include back pain, nausea and vomiting (once). Pertinent negatives include no chills, constipation, diarrhea or fever. The vaginal discharge was bloody. The vaginal bleeding is lighter than menses. She has not been passing clots. She has not been passing tissue. Nothing aggravates the symptoms. She has tried nothing for the symptoms.    RN Note: Was dx and treated for BV in Sept.  Still has the d/c. Today she saw blood when she wiped and she has already had her period. Pain in lower abd and back, started last night.  Past Medical History: Past Medical History:  Diagnosis Date  . Abscess   . Bacterial vaginal infection   . Headaches, cluster   . Scoliosis     Past obstetric history: OB History  No data available    Past Surgical History: Past Surgical History:  Procedure Laterality Date  . WISDOM TOOTH EXTRACTION      Family History: Family History  Problem Relation Age of Onset  . Diabetes Other   . Hypertension Other     Social History: Social History  Substance Use Topics  . Smoking status: Never Smoker  . Smokeless tobacco: Never Used  . Alcohol use Yes      Comment: socially    Allergies:  Allergies  Allergen Reactions  . Penicillins Hives    Has patient had a PCN reaction causing immediate rash, facial/tongue/throat swelling, SOB or lightheadedness with hypotension: Yes Has patient had a PCN reaction causing severe rash involving mucus membranes or skin necrosis: Yes Has patient had a PCN reaction that required hospitalization No Has patient had a PCN reaction occurring within the last 10 years: Yes  If all of the above answers are "NO", then may proceed with Cephalosporin use.   Marland Kitchen Morphine And Related Rash  . Bactrim [Sulfamethoxazole-Trimethoprim] Hives    Meds:  Prescriptions Prior to Admission  Medication Sig Dispense Refill Last Dose  . baclofen (LIORESAL) 10 MG tablet Take 1 tablet (10 mg total) by mouth 3 (three) times daily. (Patient not taking: Reported on 05/13/2017) 30 each 0 Not Taking at Unknown time  . diphenhydrAMINE (BENADRYL) 25 MG tablet Take 1 tablet (25 mg total) by mouth every 6 (six) hours as needed for itching (Rash). (Patient not taking: Reported on 05/13/2017) 30 tablet 0 Not Taking at Unknown time  . ondansetron (ZOFRAN ODT) 8 MG disintegrating tablet  ODT q4 hours prn nausea (Patient not taking: Reported on 05/13/2017) 4 tablet 0 Not Taking at Unknown time    I have reviewed patient's Past Medical Hx, Surgical Hx, Family Hx, Social Hx, medications and allergies.  ROS:  Review of Systems  Constitutional: Negative for chills, fatigue and fever.  Respiratory: Negative for shortness of breath.   Gastrointestinal: Positive for nausea and vomiting (once). Negative for constipation and diarrhea.  Genitourinary: Positive for vaginal bleeding.  Musculoskeletal: Positive for back pain.   Other systems negative     Physical Exam  Patient Vitals for the past 24 hrs:  BP Temp Temp src Pulse Resp SpO2 Weight  07/01/17 1649 135/86 98.1 F (36.7 C) Oral 90 16 100 % 175 lb 12 oz (79.7 kg)   Constitutional:  Well-developed, well-nourished female in no acute distress.  Cardiovascular: normal rate and rhythm, no ectopy audible, S1 & S2 heard, no murmur Respiratory: normal effort, no distress. Lungs CTAB with no wheezes or crackles GI: Abd soft, non-tender.  Nondistended.  No rebound, No guarding.  Bowel Sounds audible  MS: Extremities nontender, no edema, normal ROM Neurologic: Alert and oriented x 4.   Grossly nonfocal. GU: Neg CVAT. Skin:  Warm and Dry Psych:  Affect appropriate.  PELVIC EXAM: Cervix pink, visually closed, without lesion, small amount bloody discharge, vaginal walls and external genitalia normal Bimanual exam: Cervix firm, anterior, neg CMT, uterus mildly tender, nonenlarged, adnexa without tenderness, enlargement, or mass    Labs: Results for orders placed or performed during the hospital encounter of 07/01/17 (from the past 24 hour(s))  Urinalysis, Routine w reflex microscopic     Status: Abnormal   Collection Time: 07/01/17  4:51 PM  Result Value Ref Range   Color, Urine YELLOW YELLOW   APPearance HAZY (A) CLEAR   Specific Gravity, Urine 1.019 1.005 - 1.030   pH 5.0 5.0 - 8.0   Glucose, UA NEGATIVE NEGATIVE mg/dL   Hgb urine dipstick LARGE (A) NEGATIVE   Bilirubin Urine NEGATIVE NEGATIVE   Ketones, ur NEGATIVE NEGATIVE mg/dL   Protein, ur 30 (A) NEGATIVE mg/dL   Nitrite NEGATIVE NEGATIVE   Leukocytes, UA SMALL (A) NEGATIVE   RBC / HPF TOO NUMEROUS TO COUNT 0 - 5 RBC/hpf   WBC, UA 6-30 0 - 5 WBC/hpf   Bacteria, UA RARE (A) NONE SEEN   Squamous Epithelial / LPF 0-5 (A) NONE SEEN   Mucus PRESENT   Pregnancy, urine POC     Status: None   Collection Time: 07/01/17  5:04 PM  Result Value Ref Range   Preg Test, Ur NEGATIVE NEGATIVE  Wet prep, genital     Status: Abnormal   Collection Time: 07/01/17  5:18 PM  Result Value Ref Range   Yeast Wet Prep HPF POC NONE SEEN NONE SEEN   Trich, Wet Prep NONE SEEN NONE SEEN   Clue Cells Wet Prep HPF POC PRESENT (A) NONE SEEN    WBC, Wet Prep HPF POC MODERATE (A) NONE SEEN   Sperm NONE SEEN   CBC     Status: Abnormal   Collection Time: 07/01/17  5:29 PM  Result Value Ref Range   WBC 10.8 (H) 4.0 - 10.5 K/uL   RBC 4.88 3.87 - 5.11 MIL/uL   Hemoglobin 11.5 (L) 12.0 - 15.0 g/dL   HCT 16.1 (L) 09.6 - 04.5 %   MCV 70.5 (L) 78.0 - 100.0 fL   MCH 23.6 (L) 26.0 - 34.0 pg   MCHC 33.4 30.0 - 36.0 g/dL   RDW 40.9 81.1 - 91.4 %   Platelets 472 (H) 150 - 400 K/uL    Imaging:  No results found.  MAU Course/MDM: I have ordered labs as follows: See above.  WBC is lower than at last visit. Hgb is stable.  UA ?suggestive  of UTI, but pt is on menses, so ?contaminated  Cultures pending. Urine to culture (will not treat presumptively)  Imaging ordered: none, just had CT done  Results reviewed. Discussed possible sources of pain, likely dysmenorrhea or constipation.  Pt very upset over abnormal menses.  States has never had that happen, though notes from 2014 show similar complaint.  She also admits to recent episode.  Very upset her periods last 8 days (normal for her)  Trying to get pregnant with female friend (in same sex relationship).  States "Im just gonna quit, its not worth it".  Declines OCPs for regulation. Advised ibuprofen will help pain and may help bleeding.  Does not want Rx.  Discussed treatment for constipation, including probiotics.   Treatments in MAU included Declined Toradol.   Pt stable at time of discharge.  Assessment: DUB (dysfunctional uterine bleeding) - Plan: Discharge patient  BV (bacterial vaginosis) - Plan: Discharge patient  Constipation, unspecified constipation type - Plan: Discharge patient  Dysmenorrhea - Plan: Discharge patient    Plan: Discharge home Recommend:   See above Declines all Rxes Info provided for [facility] [employer] [facility]  Encouraged to return here or to other Urgent Care/ED if she develops worsening of symptoms, increase in pain, fever, or  other concerning symptoms.   Wynelle Bourgeois CNM, MSN Certified Nurse-Midwife 07/01/2017 5:21 PM

## 2017-07-02 LAB — GC/CHLAMYDIA PROBE AMP (~~LOC~~) NOT AT ARMC
CHLAMYDIA, DNA PROBE: NEGATIVE
Neisseria Gonorrhea: NEGATIVE

## 2017-07-02 LAB — HIV ANTIBODY (ROUTINE TESTING W REFLEX): HIV Screen 4th Generation wRfx: NONREACTIVE

## 2017-07-02 LAB — RPR: RPR: NONREACTIVE

## 2017-07-03 LAB — URINE CULTURE

## 2017-07-23 ENCOUNTER — Inpatient Hospital Stay (HOSPITAL_COMMUNITY)
Admission: AD | Admit: 2017-07-23 | Discharge: 2017-07-23 | Disposition: A | Discharge status: Home / Self Care | Payer: Self-pay | Source: Ambulatory Visit | Attending: Obstetrics & Gynecology | Admitting: Obstetrics & Gynecology

## 2017-07-23 ENCOUNTER — Encounter (HOSPITAL_COMMUNITY): Payer: Self-pay | Admitting: *Deleted

## 2017-07-23 DIAGNOSIS — N939 Abnormal uterine and vaginal bleeding, unspecified: Secondary | ICD-10-CM | POA: Insufficient documentation

## 2017-07-23 DIAGNOSIS — Z882 Allergy status to sulfonamides status: Secondary | ICD-10-CM | POA: Insufficient documentation

## 2017-07-23 DIAGNOSIS — Z880 Allergy status to penicillin: Secondary | ICD-10-CM | POA: Insufficient documentation

## 2017-07-23 LAB — URINALYSIS, ROUTINE W REFLEX MICROSCOPIC
BILIRUBIN URINE: NEGATIVE
Glucose, UA: NEGATIVE mg/dL
KETONES UR: NEGATIVE mg/dL
LEUKOCYTES UA: NEGATIVE
Nitrite: NEGATIVE
PROTEIN: 100 mg/dL — AB
Specific Gravity, Urine: 1.016 (ref 1.005–1.030)
pH: 8 (ref 5.0–8.0)

## 2017-07-23 LAB — CBC
HEMATOCRIT: 33.2 % — AB (ref 36.0–46.0)
Hemoglobin: 11.1 g/dL — ABNORMAL LOW (ref 12.0–15.0)
MCH: 23 pg — ABNORMAL LOW (ref 26.0–34.0)
MCHC: 33.4 g/dL (ref 30.0–36.0)
MCV: 68.7 fL — ABNORMAL LOW (ref 78.0–100.0)
PLATELETS: 465 10*3/uL — AB (ref 150–400)
RBC: 4.83 MIL/uL (ref 3.87–5.11)
RDW: 15.1 % (ref 11.5–15.5)
WBC: 11.4 10*3/uL — AB (ref 4.0–10.5)

## 2017-07-23 LAB — POCT PREGNANCY, URINE: PREG TEST UR: NEGATIVE

## 2017-07-23 MED ORDER — ESTRADIOL VALERATE-DIENOGEST 3/2-2/2-3/1 MG PO TABS: 1.0000 | ORAL_TABLET | Freq: Every day | ORAL | 11 refills | Status: DC

## 2017-07-23 MED ORDER — IBUPROFEN 600 MG PO TABS
600.0000 mg | ORAL_TABLET | Freq: Once | ORAL | Status: AC
  Administered 2017-07-23: 600 mg via ORAL
  Filled 2017-07-23: qty 1

## 2017-07-23 NOTE — MAU Provider Note (Signed)
Patient Janice Duarte is a 26 y.o.  Non-pregnant female here with complaints of heavy menses. She was seen in the MAU on 07-01-2017 for a similar complaint She declined birth control or pain medicine at that visit.   She was treated for DUB 4 or 5 years ago and was put on birth control for one month, which resolved her bleeding. Since then, her periods have been regular until early October (when she had her first MAU visit).   She is relieved that her constipation is better after starting probiotics.  History     CSN: [id number]  Arrival date and time: 07/23/17 1730   First Provider Initiated Contact with Patient 07/23/17 1807      Chief Complaint  Patient presents with  . Vaginal Bleeding   Vaginal Bleeding  The patient's primary symptoms include vaginal bleeding. This is a new problem. Episode onset: Oct 22 ( 3 days ago) The problem occurs constantly. The problem has been unchanged (says she is having more clots now). Associated symptoms include abdominal pain. Pertinent negatives include no constipation. The vaginal discharge was bloody. The vaginal bleeding is heavier than menses. She has been passing clots. She has tried acetaminophen for the symptoms. The treatment provided mild relief. She uses nothing for contraception. Her past medical history is significant for menorrhagia and metrorrhagia.    OB History    No data available      Past Medical History:  Diagnosis Date  . Abscess   . Bacterial vaginal infection   . Headaches, cluster   . Scoliosis     Past Surgical History:  Procedure Laterality Date  . WISDOM TOOTH EXTRACTION      Family History  Problem Relation Age of Onset  . Diabetes Other   . Hypertension Other     Social History  Substance Use Topics  . Smoking status: Never Smoker  . Smokeless tobacco: Never Used  . Alcohol use Yes     Comment: socially    Allergies:  Allergies  Allergen Reactions  . Penicillins Hives    Has patient had a  PCN reaction causing immediate rash, facial/tongue/throat swelling, SOB or lightheadedness with hypotension: Yes Has patient had a PCN reaction causing severe rash involving mucus membranes or skin necrosis: Yes Has patient had a PCN reaction that required hospitalization No Has patient had a PCN reaction occurring within the last 10 years: Yes  If all of the above answers are "NO", then may proceed with Cephalosporin use.   Marland Kitchen Morphine And Related Rash  . Bactrim [Sulfamethoxazole-Trimethoprim] Hives    Prescriptions Prior to Admission  Medication Sig Dispense Refill Last Dose  . baclofen (LIORESAL) 10 MG tablet Take 1 tablet (10 mg total) by mouth 3 (three) times daily. (Patient not taking: Reported on 05/13/2017) 30 each 0 Not Taking at Unknown time  . diphenhydrAMINE (BENADRYL) 25 MG tablet Take 1 tablet (25 mg total) by mouth every 6 (six) hours as needed for itching (Rash). (Patient not taking: Reported on 05/13/2017) 30 tablet 0 Not Taking at Unknown time  . ondansetron (ZOFRAN ODT) 8 MG disintegrating tablet 8mg  ODT q4 hours prn nausea (Patient not taking: Reported on 05/13/2017) 4 tablet 0 Not Taking at Unknown time    Review of Systems  HENT: Negative.   Respiratory: Negative.   Cardiovascular: Negative.   Gastrointestinal: Positive for abdominal pain. Negative for constipation.  Genitourinary: Positive for menorrhagia and vaginal bleeding.  Neurological: Negative.    Physical Exam   Blood  pressure 132/87, pulse 78, temperature 97.9 F (36.6 C), temperature source Oral, resp. rate 17, SpO2 100 %.  Physical Exam  Constitutional: She appears well-developed and well-nourished.  Neck: Normal range of motion.  Cardiovascular: Normal rate.   Respiratory: Effort normal.  GI: Soft.  Genitourinary:  Genitourinary Comments: NEFG; blood pooling in the vagina consistent with menses. No lesions on vaginal walls or cervix; no CMT, suprapubic or adnexal tenderness.  Musculoskeletal:  Normal range of motion.  Neurological: She is alert.  Skin: Skin is warm and dry.  Psychiatric: She has a normal mood and affect.    MAU Course  Procedures  MDM -bimanual exam benign; CBC normal . Pain now 2/10; feels better after ibuprofen.   Patient is willing to start birth control now to regulate periods and to take ibuprofen for pain.   Assessment and Plan   1. Abnormal uterine bleeding    2. Outpatient GYN US ordered; patient will discuss her fertility concerns and the best way to try to conceive. Msg sent to clinic to schedule patient with MD follow up after her US.   3. Patient to start OCP.  4. Return precautions reviewed. All questions answered.   Charlesetta GaribaldiKathryn Lorraine Kaspar Albornoz CNM 07/23/2017, 6:07 PM

## 2017-07-23 NOTE — MAU Note (Signed)
States was seen on the 3rd for  Vaginal bleeding. States bleeding stopped but has restarted and the bleeding is now heavier. Reports changing 2 pads an hour. +clots +lower abdominal pain; cramping; rating pain 5/10 but states "I can handle it"

## 2017-07-23 NOTE — Discharge Instructions (Signed)
Dysfunctional Uterine Bleeding °Dysfunctional uterine bleeding is abnormal bleeding from the uterus. Dysfunctional uterine bleeding includes: °· A period that comes earlier or later than usual. °· A period that is lighter, heavier, or has blood clots. °· Bleeding between periods. °· Skipping one or more periods. °· Bleeding after sexual intercourse. °· Bleeding after menopause. ° °Follow these instructions at home: °Pay attention to any changes in your symptoms. Follow these instructions to help with your condition: °Eating and drinking °· Eat well-balanced meals. Include foods that are high in iron, such as liver, meat, shellfish, green leafy vegetables, and eggs. °· If you become constipated: °? Drink plenty of water. °? Eat fruits and vegetables that are high in water and fiber, such as spinach, carrots, raspberries, apples, and mango. °Medicines °· Take over-the-counter and prescription medicines only as told by your health care provider. °· Do not change medicines without talking with your health care provider. °· Aspirin or medicines that contain aspirin may make the bleeding worse. Do not take those medicines: °? During the week before your period. °? During your period. °· If you were prescribed iron pills, take them as told by your health care provider. Iron pills help to replace iron that your body loses because of this condition. °Activity °· If you need to change your sanitary pad or tampon more than one time every 2 hours: °? Lie in bed with your feet raised (elevated). °? Place a cold pack on your lower abdomen. °? Rest as much as possible until the bleeding stops or slows down. °· Do not try to lose weight until the bleeding has stopped and your blood iron level is back to normal. °Other Instructions °· For two months, write down: °? When your period starts. °? When your period ends. °? When any abnormal bleeding occurs. °? What problems you notice. °· Keep all follow up visits as told by your health  care provider. This is important. °Contact a health care provider if: °· You get light-headed or weak. °· You have nausea and vomiting. °· You cannot eat or drink without vomiting. °· You feel dizzy or have diarrhea while you are taking medicines. °· You are taking birth control pills or hormones, and you want to change them or stop taking them. °Get help right away if: °· You develop a fever or chills. °· You need to change your sanitary pad or tampon more than one time per hour. °· Your bleeding becomes heavier, or your flow contains clots more often. °· You develop pain in your abdomen. °· You lose consciousness. °· You develop a rash. °This information is not intended to replace advice given to you by your health care provider. Make sure you discuss any questions you have with your health care provider. °Document Released: 09/12/2000 Document Revised: 02/21/2016 Document Reviewed: 12/11/2014 °Elsevier Interactive Patient Education © 2018 [employer] ° °

## 2017-07-24 ENCOUNTER — Other Ambulatory Visit: Payer: Self-pay | Admitting: Student

## 2017-07-24 DIAGNOSIS — N939 Abnormal uterine and vaginal bleeding, unspecified: Secondary | ICD-10-CM

## 2017-08-06 ENCOUNTER — Ambulatory Visit (HOSPITAL_COMMUNITY): Payer: Self-pay

## 2017-08-06 ENCOUNTER — Encounter (HOSPITAL_COMMUNITY): Payer: Self-pay

## 2017-08-06 ENCOUNTER — Emergency Department (HOSPITAL_COMMUNITY)
Admission: EM | Admit: 2017-08-06 | Discharge: 2017-08-06 | Disposition: A | Discharge status: Home / Self Care | Payer: Self-pay | Attending: Emergency Medicine | Admitting: Emergency Medicine

## 2017-08-06 DIAGNOSIS — L0501 Pilonidal cyst with abscess: Secondary | ICD-10-CM | POA: Insufficient documentation

## 2017-08-06 DIAGNOSIS — Z79899 Other long term (current) drug therapy: Secondary | ICD-10-CM | POA: Insufficient documentation

## 2017-08-06 MED ORDER — KETOROLAC TROMETHAMINE 30 MG/ML IJ SOLN
30.0000 mg | Freq: Once | INTRAMUSCULAR | Status: AC
  Administered 2017-08-06: 30 mg via INTRAMUSCULAR
  Filled 2017-08-06: qty 1

## 2017-08-06 MED ORDER — LIDOCAINE-EPINEPHRINE (PF) 2 %-1:200000 IJ SOLN
10.0000 mL | Freq: Once | INTRAMUSCULAR | Status: AC
  Administered 2017-08-06: 10 mL
  Filled 2017-08-06: qty 20

## 2017-08-06 MED ORDER — KETOROLAC TROMETHAMINE 10 MG PO TABS: 10.0000 mg | ORAL_TABLET | Freq: Four times a day (QID) | ORAL | 0 refills | Status: DC | PRN

## 2017-08-06 NOTE — ED Triage Notes (Signed)
Pt complains of an abscess at the top of her tailbone since yesterday No drainage at this time

## 2017-08-06 NOTE — ED Provider Notes (Signed)
[facility]-EMERGENCY DEPT Provider Note   CSN: [id number] Arrival date & time: 08/06/17  [id number]     History   Chief Complaint Chief Complaint  Patient presents with  . Abscess    HPI Janice Duarte is a 26 y.o. female presenting to the ED with acute onset of worsening painful mass to tailbone region since yesterday.  Patient states she has had an abscess in the past which feels the same, however not in this location.  She states she has been treating with warm compresses and Tylenol without relief.  Denies fever or chills, nausea or vomiting, or other complaints.  The history is provided by the patient.    Past Medical History:  Diagnosis Date  . Abscess   . Bacterial vaginal infection   . Headaches, cluster   . Scoliosis     There are no active problems to display for this patient.   Past Surgical History:  Procedure Laterality Date  . WISDOM TOOTH EXTRACTION      OB History    No data available       Home Medications    Prior to Admission medications   Medication Sig Start Date End Date Taking? Authorizing Provider  acetaminophen (TYLENOL) 500 MG tablet Take 2,000 mg every 6 (six) hours as needed by mouth for mild pain.   Yes [provider]  Multiple Vitamin (MULTIVITAMIN WITH MINERALS) TABS tablet Take 2 tablets daily by mouth.   Yes [provider]  Estradiol Valerate-Dienogest (NATAZIA) 3/2-2/2-3/1 MG tablet Take 1 tablet by mouth daily. Patient not taking: Reported on 08/06/2017 07/23/17   Marylene LandKooistra, Kathryn Lorraine, CNM  ketorolac (TORADOL) 10 MG tablet Take 1 tablet (10 mg total) every 6 (six) hours as needed by mouth. 08/06/17   Jacilyn Sanpedro, [place] N, PA-C    Family History Family History  Problem Relation Age of Onset  . Diabetes Other   . Hypertension Other     Social History Social History   Tobacco Use  . Smoking status: Never Smoker  . Smokeless tobacco: Never Used  Substance Use Topics  . Alcohol  use: Yes    Comment: socially  . Drug use: Yes    Frequency: 4.0 times per week    Types: Marijuana     Allergies   Penicillins; Morphine and related; and Bactrim [sulfamethoxazole-trimethoprim]   Review of Systems Review of Systems  Constitutional: Negative for chills and fever.  Gastrointestinal: Negative for nausea and vomiting.  Skin: Positive for color change.     Physical Exam Updated Vital Signs BP 135/86   Pulse 63   Temp 98.6 F (37 C) (Oral)   Resp 18   LMP 07/28/2017   SpO2 100%   Physical Exam  Constitutional: She appears well-developed and well-nourished. No distress.  HENT:  Head: Normocephalic and atraumatic.  Eyes: Conjunctivae are normal.  Cardiovascular: Intact distal pulses.  Pulmonary/Chest: Effort normal.  Skin:  Tender fluctuant erythematous mass to right side of gluteal cleft. No drainage. No significant surrounding edema.   Psychiatric: She has a normal mood and affect. Her behavior is normal.  Nursing note and vitals reviewed.    ED Treatments / Results  Labs (all labs ordered are listed, but only abnormal results are displayed) Labs Reviewed - No data to display  EKG  EKG Interpretation None       Radiology No results found.  Procedures .Marland Kitchen.Incision and Drainage Date/Time: 08/06/2017 12:00 PM Performed by: Loula Marcella, [place] N, PA-C Authorized by: Roxan Hockeyobinson,  [place] N, PA-C   Consent:    Consent obtained:  Verbal   Consent given by:  Patient   Risks discussed:  Bleeding, incomplete drainage, pain and damage to other organs   Alternatives discussed:  No treatment and alternative treatment Location:    Type:  Abscess   Size:  4   Location:  Trunk   Trunk location: gluteal cleft. Anesthesia (see MAR for exact dosages):    Anesthesia method:  Local infiltration   Local anesthetic:  Lidocaine 1% WITH epi Procedure details:    Incision types:  Single straight   Incision depth:  Dermal   Scalpel blade:  11   Wound  management:  Probed and deloculated and irrigated with saline   Drainage:  Bloody and purulent   Drainage amount:  Copious   Wound treatment:  Wound left open   Packing materials:  None Post-procedure details:    Patient tolerance of procedure:  Tolerated well, no immediate complications   (including critical care time) EMERGENCY DEPARTMENT US SOFT TISSUE INTERPRETATION "Study: Limited Soft Tissue Ultrasound"  INDICATIONS: Pain and Soft tissue infection Multiple views of the body part were obtained in real-time with a multi-frequency linear probe  PERFORMED BY: Myself IMAGES ARCHIVED?: Yes SIDE:Midline BODY PART:gluteal cleft INTERPRETATION:  Abcess present and No cellulitis noted    Medications Ordered in ED Medications  ketorolac (TORADOL) 30 MG/ML injection 30 mg (30 mg Intramuscular Given 08/06/17 1010)  lidocaine-EPINEPHrine (XYLOCAINE W/EPI) 2 %-1:200000 (PF) injection 10 mL (10 mLs Infiltration Given 08/06/17 1011)     Initial Impression / Assessment and Plan / ED Course  I have reviewed the triage vital signs and the nursing notes.  Pertinent labs & imaging results that were available during my care of the patient were reviewed by me and considered in my medical decision making (see chart for details).     Patient with pilonidal abscess amenable to incision and drainage.  Abscess was not large enough to warrant packing or drain,  wound recheck in 2 days. Encouraged home warm soaks and flushing.  Mild signs of cellulitis is surrounding skin.  Will d/c to home.  No antibiotic therapy is indicated.  Discussed results, findings, treatment and follow up. Patient advised of return precautions. Patient verbalized understanding and agreed with plan.  Final Clinical Impressions(s) / ED Diagnoses   Final diagnoses:  Pilonidal abscess    ED Discharge Orders        Ordered    ketorolac (TORADOL) 10 MG tablet  Every 6 hours PRN     08/06/17 1153       Lenton Gendreau, [place]  N, PA-C 08/06/17 1201    Shaune PollackIsaacs, [place], MD 08/07/17 [phone]

## 2017-08-06 NOTE — Discharge Instructions (Signed)
Please read instructions below.  Keep your wound clean and covered. Soak/flush your wound with warm water, multiple times per day. You can take ketorolac/toradol every 6 hours as needed for pain. When you run out of those, you can take adbil/ibuprofen every 6 hours. Follow up with your primary care or [facility] urgent care for wound recheck in 2 days.  Return to the ER for fever, worsening redness, or new or worsening symptoms.

## 2017-08-07 ENCOUNTER — Telehealth: Payer: Self-pay | Admitting: General Practice

## 2017-08-07 NOTE — Telephone Encounter (Signed)
Called patient to reschedule appointment.  She is a patient of Dr. Erin FullingHarraway-Smith.  Patient is scheduled for 09/01/17 at 9:00am.  Patient voiced understanding.

## 2017-08-07 NOTE — Telephone Encounter (Signed)
Called and left message VM in regards to appointment on 08/24/17 at 2:40pm.  Asked patient to give our office a call if unable to keep this appointment.

## 2017-08-14 ENCOUNTER — Ambulatory Visit (HOSPITAL_COMMUNITY): Admission: RE | Admit: 2017-08-14 | Payer: Self-pay | Source: Ambulatory Visit

## 2017-08-24 ENCOUNTER — Ambulatory Visit: Payer: Self-pay | Admitting: Obstetrics and Gynecology

## 2017-09-01 ENCOUNTER — Ambulatory Visit: Payer: Self-pay | Admitting: Obstetrics & Gynecology

## 2017-12-21 ENCOUNTER — Inpatient Hospital Stay (HOSPITAL_COMMUNITY)
Admission: AD | Admit: 2017-12-21 | Discharge: 2017-12-21 | Discharge status: Against Medical Advice | Payer: Self-pay | Source: Ambulatory Visit | Attending: Obstetrics & Gynecology | Admitting: Obstetrics & Gynecology

## 2017-12-21 NOTE — MAU Note (Signed)
Not in lobby x1  

## 2017-12-21 NOTE — MAU Note (Signed)
Not in lobby x2.

## 2018-04-23 ENCOUNTER — Other Ambulatory Visit: Payer: Self-pay

## 2018-04-23 ENCOUNTER — Emergency Department (HOSPITAL_COMMUNITY): Payer: Self-pay

## 2018-04-23 ENCOUNTER — Encounter (HOSPITAL_COMMUNITY): Payer: Self-pay | Admitting: Emergency Medicine

## 2018-04-23 ENCOUNTER — Emergency Department (HOSPITAL_COMMUNITY)
Admission: EM | Admit: 2018-04-23 | Discharge: 2018-04-23 | Disposition: A | Discharge status: Home / Self Care | Payer: Self-pay | Attending: Emergency Medicine | Admitting: Emergency Medicine

## 2018-04-23 DIAGNOSIS — Y929 Unspecified place or not applicable: Secondary | ICD-10-CM | POA: Insufficient documentation

## 2018-04-23 DIAGNOSIS — Z79899 Other long term (current) drug therapy: Secondary | ICD-10-CM | POA: Insufficient documentation

## 2018-04-23 DIAGNOSIS — B373 Candidiasis of vulva and vagina: Secondary | ICD-10-CM | POA: Insufficient documentation

## 2018-04-23 DIAGNOSIS — N941 Unspecified dyspareunia: Secondary | ICD-10-CM | POA: Insufficient documentation

## 2018-04-23 DIAGNOSIS — B3731 Acute candidiasis of vulva and vagina: Secondary | ICD-10-CM

## 2018-04-23 DIAGNOSIS — Y939 Activity, unspecified: Secondary | ICD-10-CM | POA: Insufficient documentation

## 2018-04-23 DIAGNOSIS — S63501A Unspecified sprain of right wrist, initial encounter: Secondary | ICD-10-CM | POA: Insufficient documentation

## 2018-04-23 DIAGNOSIS — Y999 Unspecified external cause status: Secondary | ICD-10-CM | POA: Insufficient documentation

## 2018-04-23 LAB — URINALYSIS, ROUTINE W REFLEX MICROSCOPIC
Bilirubin Urine: NEGATIVE
GLUCOSE, UA: NEGATIVE mg/dL
HGB URINE DIPSTICK: NEGATIVE
KETONES UR: NEGATIVE mg/dL
LEUKOCYTES UA: NEGATIVE
Nitrite: NEGATIVE
PROTEIN: NEGATIVE mg/dL
Specific Gravity, Urine: 1.011 (ref 1.005–1.030)
pH: 7 (ref 5.0–8.0)

## 2018-04-23 LAB — RPR: RPR Ser Ql: NONREACTIVE

## 2018-04-23 LAB — WET PREP, GENITAL
Sperm: NONE SEEN
TRICH WET PREP: NONE SEEN

## 2018-04-23 LAB — HIV ANTIBODY (ROUTINE TESTING W REFLEX): HIV Screen 4th Generation wRfx: NONREACTIVE

## 2018-04-23 LAB — GC/CHLAMYDIA PROBE AMP (~~LOC~~) NOT AT ARMC
Chlamydia: NEGATIVE
NEISSERIA GONORRHEA: NEGATIVE

## 2018-04-23 LAB — PREGNANCY, URINE: PREG TEST UR: NEGATIVE

## 2018-04-23 MED ORDER — HYDROCODONE-ACETAMINOPHEN 5-325 MG PO TABS
1.0000 | ORAL_TABLET | Freq: Once | ORAL | Status: AC
  Administered 2018-04-23: 1 via ORAL
  Filled 2018-04-23: qty 1

## 2018-04-23 MED ORDER — IBUPROFEN 600 MG PO TABS: 600.0000 mg | ORAL_TABLET | Freq: Four times a day (QID) | ORAL | 0 refills | Status: DC | PRN

## 2018-04-23 MED ORDER — FLUCONAZOLE 150 MG PO TABS
150.0000 mg | ORAL_TABLET | Freq: Once | ORAL | Status: AC
  Administered 2018-04-23: 150 mg via ORAL
  Filled 2018-04-23: qty 1

## 2018-04-23 NOTE — ED Notes (Signed)
Patient given ice for wrist and ambulated well to BR for sample.

## 2018-04-23 NOTE — Discharge Instructions (Addendum)
Your wrist has been diagnosed as a sprain injury and should improve over the next 4-5 days. Wear splint for comfort and take ibuprofen as prescribed. You have received treatment here for a vaginal yeast infection. If painful intercourse persists, follow up with your gynecologist if this continues.
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
## 2018-04-23 NOTE — ED Notes (Signed)
Discharge instructions reviewed with pt. Pt verbalized understanding. Pt to follow up with PCP. Pt ambulatory to waiting room.  
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
## 2018-09-20 ENCOUNTER — Emergency Department (HOSPITAL_COMMUNITY): Payer: Self-pay

## 2018-09-20 ENCOUNTER — Emergency Department (HOSPITAL_COMMUNITY)
Admission: EM | Admit: 2018-09-20 | Discharge: 2018-09-20 | Disposition: A | Discharge status: Home / Self Care | Payer: Self-pay | Attending: Emergency Medicine | Admitting: Emergency Medicine

## 2018-09-20 ENCOUNTER — Other Ambulatory Visit: Payer: Self-pay

## 2018-09-20 ENCOUNTER — Encounter (HOSPITAL_COMMUNITY): Payer: Self-pay

## 2018-09-20 DIAGNOSIS — Y929 Unspecified place or not applicable: Secondary | ICD-10-CM | POA: Insufficient documentation

## 2018-09-20 DIAGNOSIS — Y999 Unspecified external cause status: Secondary | ICD-10-CM | POA: Insufficient documentation

## 2018-09-20 DIAGNOSIS — Z79899 Other long term (current) drug therapy: Secondary | ICD-10-CM | POA: Insufficient documentation

## 2018-09-20 DIAGNOSIS — R600 Localized edema: Secondary | ICD-10-CM | POA: Insufficient documentation

## 2018-09-20 DIAGNOSIS — W19XXXA Unspecified fall, initial encounter: Secondary | ICD-10-CM | POA: Insufficient documentation

## 2018-09-20 DIAGNOSIS — Y9389 Activity, other specified: Secondary | ICD-10-CM | POA: Insufficient documentation

## 2018-09-20 DIAGNOSIS — M25561 Pain in right knee: Secondary | ICD-10-CM | POA: Insufficient documentation

## 2018-09-20 MED ORDER — NAPROXEN 500 MG PO TABS: 500.0000 mg | ORAL_TABLET | Freq: Two times a day (BID) | ORAL | 0 refills | Status: DC

## 2018-09-20 MED ORDER — HYDROCODONE-ACETAMINOPHEN 5-325 MG PO TABS
1.0000 | ORAL_TABLET | Freq: Once | ORAL | Status: AC
  Administered 2018-09-20: 1 via ORAL
  Filled 2018-09-20: qty 1
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
## 2018-09-20 NOTE — Discharge Instructions (Addendum)
Follow up with your primary care doctor or return here as needed for worsening symptoms.
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
## 2018-10-28 ENCOUNTER — Other Ambulatory Visit: Payer: Self-pay

## 2018-10-28 ENCOUNTER — Inpatient Hospital Stay (HOSPITAL_COMMUNITY)
Admission: AD | Admit: 2018-10-28 | Discharge: 2018-10-28 | Disposition: A | Discharge status: Home / Self Care | Payer: Self-pay | Attending: Obstetrics and Gynecology | Admitting: Obstetrics and Gynecology

## 2018-10-28 ENCOUNTER — Encounter (HOSPITAL_COMMUNITY): Payer: Self-pay | Admitting: *Deleted

## 2018-10-28 DIAGNOSIS — Z880 Allergy status to penicillin: Secondary | ICD-10-CM | POA: Insufficient documentation

## 2018-10-28 DIAGNOSIS — K5900 Constipation, unspecified: Secondary | ICD-10-CM | POA: Insufficient documentation

## 2018-10-28 DIAGNOSIS — Z3202 Encounter for pregnancy test, result negative: Secondary | ICD-10-CM | POA: Insufficient documentation

## 2018-10-28 DIAGNOSIS — R109 Unspecified abdominal pain: Secondary | ICD-10-CM | POA: Insufficient documentation

## 2018-10-28 HISTORY — DX: Essential (primary) hypertension: I10

## 2018-10-28 LAB — URINALYSIS, ROUTINE W REFLEX MICROSCOPIC
Bilirubin Urine: NEGATIVE
GLUCOSE, UA: NEGATIVE mg/dL
Hgb urine dipstick: NEGATIVE
Ketones, ur: NEGATIVE mg/dL
LEUKOCYTES UA: NEGATIVE
NITRITE: NEGATIVE
PH: 7 (ref 5.0–8.0)
Protein, ur: NEGATIVE mg/dL
SPECIFIC GRAVITY, URINE: 1.01 (ref 1.005–1.030)

## 2018-10-28 LAB — WET PREP, GENITAL
CLUE CELLS WET PREP: NONE SEEN
Sperm: NONE SEEN
TRICH WET PREP: NONE SEEN
Yeast Wet Prep HPF POC: NONE SEEN

## 2018-10-28 LAB — CBC
HCT: 36.4 % (ref 36.0–46.0)
Hemoglobin: 12.1 g/dL (ref 12.0–15.0)
MCH: 23 pg — AB (ref 26.0–34.0)
MCHC: 33.2 g/dL (ref 30.0–36.0)
MCV: 69.3 fL — ABNORMAL LOW (ref 80.0–100.0)
Platelets: 419 10*3/uL — ABNORMAL HIGH (ref 150–400)
RBC: 5.25 MIL/uL — ABNORMAL HIGH (ref 3.87–5.11)
RDW: 16.2 % — AB (ref 11.5–15.5)
WBC: 10.9 10*3/uL — AB (ref 4.0–10.5)
nRBC: 0 % (ref 0.0–0.2)

## 2018-10-28 LAB — POCT PREGNANCY, URINE: PREG TEST UR: NEGATIVE

## 2018-10-28 MED ORDER — KETOROLAC TROMETHAMINE 30 MG/ML IJ SOLN
30.0000 mg | Freq: Once | INTRAMUSCULAR | Status: AC
  Administered 2018-10-28: 30 mg via INTRAMUSCULAR
  Filled 2018-10-28: qty 1

## 2018-10-28 NOTE — MAU Note (Cosign Needed)
Urine sent to lab 

## 2018-10-28 NOTE — MAU Note (Signed)
Pt states she had unprotected sex since her LMP, also having pain with intercourse.

## 2018-10-28 NOTE — MAU Provider Note (Addendum)
History     CSN: [id number]  Arrival date and time: 10/28/18 0174   First Provider Initiated Contact with Patient 10/28/18 0902      Chief Complaint  Patient presents with  . Abdominal Pain  . Back Pain  . Vaginal Discharge  . Urinary Frequency   28 y.o. non-pregnant female presenting with LAP and back pain. LAP started about 3-4 days ago. Describes as cramping, intermittent, and bilateral. Reports urinary frequency onset at same time, no other urinary sx. Reports hx of chronic constipation since she was a child. Last BM was yesterday, +flatus. She takes Dulcolax every 2 days. Remote hx of STD, unsure which one. Denies vaginal discharge. Had unprotected sex a few days ago, no new partner but would like STD screen. Back pain started yesterday. Located mid to low back, on right. Denies injury or strenuous activity.  Past Medical History:  Diagnosis Date  . Abscess   . Bacterial vaginal infection   . Headaches, cluster   . Hypertension   . Scoliosis     Past Surgical History:  Procedure Laterality Date  . WISDOM TOOTH EXTRACTION      Family History  Problem Relation Age of Onset  . Diabetes Other   . Hypertension Other     Social History   Tobacco Use  . Smoking status: Never Smoker  . Smokeless tobacco: Never Used  Substance Use Topics  . Alcohol use: Yes    Comment: socially  . Drug use: Not Currently    Frequency: 4.0 times per week    Types: Marijuana    Allergies:  Allergies  Allergen Reactions  . Penicillins Hives    Has patient had a PCN reaction causing immediate rash, facial/tongue/throat swelling, SOB or lightheadedness with hypotension: Yes Has patient had a PCN reaction causing severe rash involving mucus membranes or skin necrosis: Yes Has patient had a PCN reaction that required hospitalization No Has patient had a PCN reaction occurring within the last 10 years: Yes  If all of the above answers are "NO", then may proceed with Cephalosporin  use.   Marland Kitchen Morphine And Related Rash  . Bactrim [Sulfamethoxazole-Trimethoprim] Hives    No medications prior to admission.    Review of Systems  Constitutional: Negative for chills and fever.  Gastrointestinal: Positive for constipation. Negative for diarrhea, nausea and vomiting.  Genitourinary: Positive for frequency. Negative for dysuria, hematuria, urgency, vaginal bleeding and vaginal discharge.  Musculoskeletal: Positive for back pain.   Physical Exam   Blood pressure 118/88, pulse 80, temperature 98.6 F (37 C), temperature source Oral, resp. rate 16, weight 68.9 kg, last menstrual period 10/06/2018, SpO2 100 %.  Physical Exam  Constitutional: She is oriented to person, place, and time. She appears well-developed and well-nourished. No distress.  HENT:  Head: Normocephalic and atraumatic.  Neck: Normal range of motion.  Cardiovascular: Normal rate.  Respiratory: Effort normal. No respiratory distress.  GI: Soft. She exhibits no mass. There is no abdominal tenderness. There is no rebound and no guarding.  Genitourinary:    Genitourinary Comments: External: no lesions or erythema Vagina: rugated, pink, moist, scant white discharge Uterus: non enlarged, anteverted, non tender, no CMT Adnexae: no masses, no tenderness left, no tenderness right Cervix normal    Musculoskeletal: Normal range of motion.  Neurological: She is alert and oriented to person, place, and time.  Skin: Skin is warm and dry.  Psychiatric: She has a normal mood and affect.   Results for orders placed or performed  during the hospital encounter of 10/28/18 (from the past 24 hour(s))  Urinalysis, Routine w reflex microscopic     Status: None   Collection Time: 10/28/18  8:41 AM  Result Value Ref Range   Color, Urine YELLOW YELLOW   APPearance CLEAR CLEAR   Specific Gravity, Urine 1.010 1.005 - 1.030   pH 7.0 5.0 - 8.0   Glucose, UA NEGATIVE NEGATIVE mg/dL   Hgb urine dipstick NEGATIVE NEGATIVE    Bilirubin Urine NEGATIVE NEGATIVE   Ketones, ur NEGATIVE NEGATIVE mg/dL   Protein, ur NEGATIVE NEGATIVE mg/dL   Nitrite NEGATIVE NEGATIVE   Leukocytes, UA NEGATIVE NEGATIVE  Pregnancy, urine POC     Status: None   Collection Time: 10/28/18  9:00 AM  Result Value Ref Range   Preg Test, Ur NEGATIVE NEGATIVE  Wet prep, genital     Status: Abnormal   Collection Time: 10/28/18  9:12 AM  Result Value Ref Range   Yeast Wet Prep HPF POC NONE SEEN NONE SEEN   Trich, Wet Prep NONE SEEN NONE SEEN   Clue Cells Wet Prep HPF POC NONE SEEN NONE SEEN   WBC, Wet Prep HPF POC FEW (A) NONE SEEN   Sperm NONE SEEN   CBC     Status: Abnormal   Collection Time: 10/28/18  9:34 AM  Result Value Ref Range   WBC 10.9 (H) 4.0 - 10.5 K/uL   RBC 5.25 (H) 3.87 - 5.11 MIL/uL   Hemoglobin 12.1 12.0 - 15.0 g/dL   HCT 04.536.4 40.936.0 - 81.146.0 %   MCV 69.3 (L) 80.0 - 100.0 fL   MCH 23.0 (L) 26.0 - 34.0 pg   MCHC 33.2 30.0 - 36.0 g/dL   RDW 91.416.2 (H) 78.211.5 - 95.615.5 %   Platelets 419 (H) 150 - 400 K/uL   nRBC 0.0 0.0 - 0.2 %   MAU Course  Procedures Orders Placed This Encounter  Procedures  . Wet prep, genital    Standing Status:   Standing    Number of Occurrences:   1    Order Specific Question:   Patient immune status    Answer:   Normal  . Urinalysis, Routine w reflex microscopic    Standing Status:   Standing    Number of Occurrences:   1  . CBC    Standing Status:   Standing    Number of Occurrences:   1  . Pregnancy, urine POC    Standing Status:   Standing    Number of Occurrences:   1  . Discharge patient    Order Specific Question:   Discharge disposition    Answer:   01-Home or Self Care [1]    Order Specific Question:   Discharge patient date    Answer:   10/28/2018   Meds ordered this encounter  Medications  . ketorolac (TORADOL) 30 MG/ML injection 30 mg   MDM Labs ordered and reviewed. No evidence of acute abdominal or pelvic process. Pain likely constipation. GC pending. Discussed treatment  measures. Stable for discharge home.  Assessment and Plan   1. Constipation, unspecified constipation type    Discharge home Follow up with [facility] as needed  Allergies as of 10/28/2018      Reactions   Penicillins Hives   Has patient had a PCN reaction causing immediate rash, facial/tongue/throat swelling, SOB or lightheadedness with hypotension: Yes Has patient had a PCN reaction causing severe rash involving mucus membranes or skin necrosis: Yes Has patient had a  PCN reaction that required hospitalization No Has patient had a PCN reaction occurring within the last 10 years: Yes  If all of the above answers are "NO", then may proceed with Cephalosporin use.   Morphine And Related Rash   Bactrim [sulfamethoxazole-trimethoprim] Hives      Medication List    STOP taking these medications   Estradiol Valerate-Dienogest 3/2-2/2-3/1 MG tablet Commonly known as:  NATAZIA   ketorolac 10 MG tablet Commonly known as:  TORADOL     TAKE these medications   acetaminophen 500 MG tablet Commonly known as:  TYLENOL Take 2,000 mg every 6 (six) hours as needed by mouth for mild pain.   ibuprofen 600 MG tablet Commonly known as:  ADVIL,MOTRIN Take 1 tablet (600 mg total) by mouth every 6 (six) hours as needed.   multivitamin with minerals Tabs tablet Take 2 tablets daily by mouth.   naproxen 500 MG tablet Commonly known as:  NAPROSYN Take 1 tablet (500 mg total) by mouth 2 (two) times daily.      Donette Larry, CNM 10/28/2018, 11:41 AM

## 2018-10-28 NOTE — MAU Note (Signed)
Having d/c.  Started 2 days ago.  Creamy white, no odor or irritation.  Having stomach cramps and pain on rt mid back.  Started yesterday. Having frequency and urgency with urination, no pain associated.

## 2018-10-28 NOTE — Discharge Instructions (Signed)

## 2018-10-29 LAB — GC/CHLAMYDIA PROBE AMP (~~LOC~~) NOT AT ARMC
Chlamydia: NEGATIVE
Neisseria Gonorrhea: NEGATIVE

## 2018-11-21 ENCOUNTER — Other Ambulatory Visit: Payer: Self-pay

## 2018-11-21 ENCOUNTER — Emergency Department (HOSPITAL_COMMUNITY)
Admission: EM | Admit: 2018-11-21 | Discharge: 2018-11-21 | Disposition: A | Discharge status: Home / Self Care | Payer: Self-pay | Attending: Emergency Medicine | Admitting: Emergency Medicine

## 2018-11-21 DIAGNOSIS — G43909 Migraine, unspecified, not intractable, without status migrainosus: Secondary | ICD-10-CM | POA: Insufficient documentation

## 2018-11-21 DIAGNOSIS — N390 Urinary tract infection, site not specified: Secondary | ICD-10-CM | POA: Insufficient documentation

## 2018-11-21 DIAGNOSIS — N611 Abscess of the breast and nipple: Secondary | ICD-10-CM | POA: Insufficient documentation

## 2018-11-21 LAB — PREGNANCY, URINE: Preg Test, Ur: NEGATIVE

## 2018-11-21 LAB — URINALYSIS, ROUTINE W REFLEX MICROSCOPIC
Bilirubin Urine: NEGATIVE
Glucose, UA: NEGATIVE mg/dL
Ketones, ur: NEGATIVE mg/dL
Nitrite: NEGATIVE
PROTEIN: NEGATIVE mg/dL
Specific Gravity, Urine: 1.01 (ref 1.005–1.030)
pH: 6 (ref 5.0–8.0)

## 2018-11-21 MED ORDER — KETOROLAC TROMETHAMINE 60 MG/2ML IM SOLN
60.0000 mg | Freq: Once | INTRAMUSCULAR | Status: AC
  Administered 2018-11-21: 60 mg via INTRAMUSCULAR
  Filled 2018-11-21: qty 2

## 2018-11-21 MED ORDER — LIDOCAINE-EPINEPHRINE 2 %-1:200000 IJ SOLN
10.0000 mL | Freq: Once | INTRAMUSCULAR | Status: AC
Start: 2018-11-21 — End: 2018-11-21
  Administered 2018-11-21: 10 mL
  Filled 2018-11-21: qty 20

## 2018-11-21 MED ORDER — METOCLOPRAMIDE HCL 5 MG/ML IJ SOLN
10.0000 mg | Freq: Once | INTRAMUSCULAR | Status: AC
  Administered 2018-11-21: 10 mg via INTRAMUSCULAR
  Filled 2018-11-21: qty 2

## 2018-11-21 MED ORDER — CEPHALEXIN 500 MG PO CAPS: 1000.0000 mg | ORAL_CAPSULE | Freq: Two times a day (BID) | ORAL | 0 refills | Status: DC

## 2018-11-21 MED ORDER — DIPHENHYDRAMINE HCL 50 MG/ML IJ SOLN
25.0000 mg | Freq: Once | INTRAMUSCULAR | Status: AC
  Administered 2018-11-21: 25 mg via INTRAMUSCULAR
  Filled 2018-11-21: qty 1

## 2018-11-21 NOTE — Discharge Instructions (Signed)
1.  Return to the emergency department or urgent care or family doctor in 2 to 3 days for packing removal.  Try to leave packing in place.  You may change outer dressings. 2.  Take Keflex as prescribed for urinary tract infection.  Return if you develop fevers worsening pain or other concerning symptoms.

## 2018-11-21 NOTE — ED Provider Notes (Signed)
[facility] EMERGENCY DEPARTMENT Provider Note   CSN: [id number] Arrival date & time: 11/21/18  1043    History   Chief Complaint Chief Complaint  Patient presents with  . Urinary Tract Infection  . Abscess    HPI Janice Duarte is a 28 y.o. female.     HPI 3 chief complaints. Chief complaint #1 headache: 3 days and generalized posterior aching in quality.  Patient has history of migraines.  She has been trying ibuprofen without relief.  No fever no chills no neck stiffness.  No sore throat no nasal congestion or drainage.  No earache.  No nausea no vomiting no confusion.  History of migraines.  Positive light sensitivity.  Chief complaint #2 burning with urination: Patient reports 2 days of urgency and frequency with burning when she urinates.  No fevers, no chills.  Reports some low back pain.  Chief complaint #3 abscess under breast: Patient has a tender swollen area beneath the left breast.  She has had an abscess in the area previously that required drainage about a year ago.  Is been swelling for several days and getting more tender.  Past Medical History:  Diagnosis Date  . Abscess   . Bacterial vaginal infection   . Headaches, cluster   . Hypertension   . Scoliosis     There are no active problems to display for this patient.   Past Surgical History:  Procedure Laterality Date  . WISDOM TOOTH EXTRACTION       OB History    Gravida  0   Para  0   Term  0   Preterm  0   AB  0   Living  0     SAB  0   TAB  0   Ectopic  0   Multiple  0   Live Births  0            Home Medications    Prior to Admission medications   Medication Sig Start Date End Date Taking? Authorizing Provider  acetaminophen (TYLENOL) 500 MG tablet Take 2,000 mg every 6 (six) hours as needed by mouth for mild pain.    [provider]  cephALEXin (KEFLEX) 500 MG capsule Take 2 capsules (1,000 mg total) by mouth 2 (two) times daily.  11/21/18   Arby Barrette, MD  ibuprofen (ADVIL,MOTRIN) 600 MG tablet Take 1 tablet (600 mg total) by mouth every 6 (six) hours as needed. 04/23/18   Elpidio Anis, PA-C  Multiple Vitamin (MULTIVITAMIN WITH MINERALS) TABS tablet Take 2 tablets daily by mouth.    [provider]  naproxen (NAPROSYN) 500 MG tablet Take 1 tablet (500 mg total) by mouth 2 (two) times daily. 09/20/18   Janne Napoleon, NP    Family History Family History  Problem Relation Age of Onset  . Diabetes Other   . Hypertension Other     Social History Social History   Tobacco Use  . Smoking status: Never Smoker  . Smokeless tobacco: Never Used  Substance Use Topics  . Alcohol use: Yes    Comment: socially  . Drug use: Not Currently    Frequency: 4.0 times per week    Types: Marijuana     Allergies   Penicillins; Morphine and related; and Bactrim [sulfamethoxazole-trimethoprim]   Review of Systems Review of Systems 10 Systems reviewed and are negative for acute change except as noted in the HPI.   Physical Exam Updated Vital Signs BP Marland Kitchen)  94/56   Pulse (!) 56   Temp 99 F (37.2 C) (Oral)   Resp 16   Ht 5\' 5"  (1.651 m)   Wt 68.9 kg   LMP 11/04/2018 (Exact Date)   SpO2 100%   BMI 25.29 kg/m   Physical Exam Constitutional:      Appearance: She is well-developed.  HENT:     Head: Normocephalic and atraumatic.     Comments: No facial swelling.  Nares are patent.  Dentition in good condition.  Posterior oropharynx widely patent no erythema or exudate.  Bilateral TMs normal. Eyes:     Extraocular Movements: Extraocular movements intact.     Pupils: Pupils are equal, round, and reactive to light.  Neck:     Musculoskeletal: Neck supple.  Cardiovascular:     Rate and Rhythm: Normal rate and regular rhythm.     Heart sounds: Normal heart sounds.  Pulmonary:     Effort: Pulmonary effort is normal.     Breath sounds: Normal breath sounds.  Abdominal:     General: Bowel sounds are  normal. There is no distension.     Palpations: Abdomen is soft.     Tenderness: There is no abdominal tenderness.  Musculoskeletal: Normal range of motion.  Skin:    General: Skin is warm and dry.     Comments: Medially beneath the left breast is an area of fluctuance approximately 2-1/2 cm.  Some associated old scar tissue from previous I&D.  No extending cellulitis around this.  Breast is otherwise soft and nontender.  Neurological:     Mental Status: She is alert and oriented to person, place, and time.     GCS: GCS eye subscore is 4. GCS verbal subscore is 5. GCS motor subscore is 6.     Cranial Nerves: No cranial nerve deficit.     Coordination: Coordination normal.  Psychiatric:        Mood and Affect: Mood normal.      ED Treatments / Results  Labs (all labs ordered are listed, but only abnormal results are displayed) Labs Reviewed  URINALYSIS, ROUTINE W REFLEX MICROSCOPIC - Abnormal; Notable for the following components:      Result Value   APPearance CLOUDY (*)    Hgb urine dipstick LARGE (*)    Leukocytes,Ua LARGE (*)    Bacteria, UA RARE (*)    All other components within normal limits  URINE CULTURE  PREGNANCY, URINE  GC/CHLAMYDIA PROBE AMP ([place]) NOT AT [facility]    EKG None  Radiology No results found.  Procedures .Marland KitchenIncision and Drainage Date/Time: 11/21/2018 12:38 PM Performed by: Arby Barrette, MD Authorized by: Arby Barrette, MD   Consent:    Consent obtained:  Verbal   Consent given by:  Patient   Risks discussed:  Bleeding, incomplete drainage, pain and infection   Alternatives discussed:  Delayed treatment and alternative treatment Location:    Type:  Abscess   Location:  Trunk   Trunk location:  L breast Pre-procedure details:    Skin preparation:  Betadine Anesthesia (see MAR for exact dosages):    Anesthesia method:  Local infiltration   Local anesthetic:  Lidocaine 2% WITH epi Procedure type:    Complexity:  Complex Procedure  details:    Incision types:  Single with marsupialization   Incision depth:  Subcutaneous   Scalpel blade:  11   Wound management:  Probed and deloculated   Drainage:  Purulent   Drainage amount:  Moderate   Packing  materials:  1/2 in iodoform gauze   Amount 1/2" iodoform:  4 Post-procedure details:    Patient tolerance of procedure:  Tolerated well, no immediate complications   (including critical care time)  Medications Ordered in ED Medications  ketorolac (TORADOL) injection 60 mg (60 mg Intramuscular Given 11/21/18 1127)  metoCLOPramide (REGLAN) injection 10 mg (10 mg Intramuscular Given 11/21/18 1126)  diphenhydrAMINE (BENADRYL) injection 25 mg (25 mg Intramuscular Given 11/21/18 1127)  lidocaine-EPINEPHrine (XYLOCAINE W/EPI) 2 %-1:200000 (PF) injection 10 mL (10 mLs Infiltration Given 11/21/18 1127)     Initial Impression / Assessment and Plan / ED Course  I have reviewed the triage vital signs and the nursing notes.  Pertinent labs & imaging results that were available during my care of the patient were reviewed by me and considered in my medical decision making (see chart for details).        Patient nontoxic.  Urinalysis positive.  Treated with Keflex.  Return precautions reviewed.  No signs of pyelonephritis at this time. Breast abscess incision and drainage done.  Return precautions and care reviewed. Patient treated with migraine cocktail.  Mental status normal.  Uncomplicated migraine headache.  Final Clinical Impressions(s) / ED Diagnoses   Final diagnoses:  Lower urinary tract infectious disease  Breast abscess  Migraine without status migrainosus, not intractable, unspecified migraine type    ED Discharge Orders         Ordered    cephALEXin (KEFLEX) 500 MG capsule  2 times daily     11/21/18 1225           Arby Barrette, MD 11/21/18 1240

## 2018-11-21 NOTE — ED Triage Notes (Signed)
Per pt, having headache 10/10 x3 days, UTI, and an abscess under left breast. Urinary symptoms x 2days

## 2018-11-22 LAB — URINE CULTURE

## 2018-11-30 ENCOUNTER — Other Ambulatory Visit: Payer: Self-pay

## 2018-11-30 ENCOUNTER — Emergency Department (HOSPITAL_COMMUNITY)
Admission: EM | Admit: 2018-11-30 | Discharge: 2018-11-30 | Disposition: A | Discharge status: Home / Self Care | Payer: Self-pay | Attending: Emergency Medicine | Admitting: Emergency Medicine

## 2018-11-30 ENCOUNTER — Encounter (HOSPITAL_COMMUNITY): Payer: Self-pay | Admitting: *Deleted

## 2018-11-30 DIAGNOSIS — N760 Acute vaginitis: Secondary | ICD-10-CM | POA: Insufficient documentation

## 2018-11-30 DIAGNOSIS — R51 Headache: Secondary | ICD-10-CM | POA: Insufficient documentation

## 2018-11-30 DIAGNOSIS — I10 Essential (primary) hypertension: Secondary | ICD-10-CM | POA: Insufficient documentation

## 2018-11-30 DIAGNOSIS — B9689 Other specified bacterial agents as the cause of diseases classified elsewhere: Secondary | ICD-10-CM | POA: Insufficient documentation

## 2018-11-30 DIAGNOSIS — N390 Urinary tract infection, site not specified: Secondary | ICD-10-CM | POA: Insufficient documentation

## 2018-11-30 DIAGNOSIS — R319 Hematuria, unspecified: Secondary | ICD-10-CM | POA: Insufficient documentation

## 2018-11-30 DIAGNOSIS — R519 Headache, unspecified: Secondary | ICD-10-CM

## 2018-11-30 LAB — URINALYSIS, ROUTINE W REFLEX MICROSCOPIC
Bilirubin Urine: NEGATIVE
Glucose, UA: NEGATIVE mg/dL
Hgb urine dipstick: NEGATIVE
Ketones, ur: NEGATIVE mg/dL
Nitrite: NEGATIVE
Protein, ur: NEGATIVE mg/dL
Specific Gravity, Urine: 1.016 (ref 1.005–1.030)
pH: 5 (ref 5.0–8.0)

## 2018-11-30 LAB — WET PREP, GENITAL
Sperm: NONE SEEN
Trich, Wet Prep: NONE SEEN
Yeast Wet Prep HPF POC: NONE SEEN

## 2018-11-30 LAB — I-STAT BETA HCG BLOOD, ED (MC, WL, AP ONLY): I-stat hCG, quantitative: <5 m[IU]/mL (ref <5)

## 2018-11-30 MED ORDER — KETOROLAC TROMETHAMINE 15 MG/ML IJ SOLN
15.0000 mg | Freq: Once | INTRAMUSCULAR | Status: AC
  Administered 2018-11-30: 15 mg via INTRAVENOUS
  Filled 2018-11-30: qty 1

## 2018-11-30 MED ORDER — NYSTATIN 100000 UNIT/GM EX CREA: TOPICAL_CREAM | CUTANEOUS | 0 refills | Status: DC

## 2018-11-30 MED ORDER — FLUCONAZOLE 150 MG PO TABS
150.0000 mg | ORAL_TABLET | Freq: Once | ORAL | Status: AC
  Administered 2018-11-30: 150 mg via ORAL
  Filled 2018-11-30: qty 1

## 2018-11-30 MED ORDER — CIPROFLOXACIN HCL 500 MG PO TABS: 250.0000 mg | ORAL_TABLET | Freq: Two times a day (BID) | ORAL | 0 refills | Status: DC

## 2018-11-30 MED ORDER — CIPROFLOXACIN HCL 500 MG PO TABS: 250.0000 mg | ORAL_TABLET | Freq: Two times a day (BID) | ORAL | 0 refills | Status: AC

## 2018-11-30 MED ORDER — METRONIDAZOLE 500 MG PO TABS
2000.0000 mg | ORAL_TABLET | Freq: Once | ORAL | Status: AC
  Administered 2018-11-30: 2000 mg via ORAL
  Filled 2018-11-30: qty 4

## 2018-11-30 MED ORDER — SODIUM CHLORIDE 0.9 % IV BOLUS
500.0000 mL | Freq: Once | INTRAVENOUS | Status: AC
  Administered 2018-11-30: 500 mL via INTRAVENOUS

## 2018-11-30 MED ORDER — METOCLOPRAMIDE HCL 5 MG/ML IJ SOLN
10.0000 mg | Freq: Once | INTRAMUSCULAR | Status: AC
  Administered 2018-11-30: 10 mg via INTRAVENOUS
  Filled 2018-11-30: qty 2

## 2018-11-30 MED ORDER — ONDANSETRON HCL 4 MG/2ML IJ SOLN
4.0000 mg | Freq: Once | INTRAMUSCULAR | Status: AC
  Administered 2018-11-30: 4 mg via INTRAVENOUS
  Filled 2018-11-30: qty 2

## 2018-11-30 NOTE — ED Provider Notes (Signed)
[facility]-EMERGENCY DEPT Provider Note   CSN: [id number] Arrival date & time: 11/30/18  1019    History   Chief Complaint Chief Complaint  Patient presents with  . Migraine  . Urinary Tract Infection  . Vaginitis    HPI Janice Duarte is a 28 y.o. female.     HPI  Pt is a 28 y/o female with a h/o HA, HTN, abscess, who presents to the ED today for evaluation of multiple complaints.  UTI: States she was diagnosed with a UTI on 11/21/18. States she has right flank pain, dysuria, frequency, urgency. Denies hematuria. She reports she thinks she has a yeast infection as she is having burning/itchy to the vaginal area. She notes a white cottage cheese discharge that she noted yesterday. She reports unprotected intercourse last month with her partner.   Abscess: States that the abscess that was I&D is improving.  For the UTI and skin infection she was given rx for keflex. She states she is taking one capsule BID and states she was supposed to be on this for 14 days. On review of records it appears the rx was actually written for 2000mg  bid for 7 days.  Headache: States she has a long h/o HA. Pain located to the back and the front of the head. Sxs started 1.5 days ago. Pain rated 7/10. Pain feels like a throbbing pain. Reports associated photophobia. No dizziness, lightheadedness, or vision changes. HA feels similar to past headaches.  She denies abd pain, nausea, vomiting, constipation. Reports diarrhea since starting keflex.  Past Medical History:  Diagnosis Date  . Abscess   . Bacterial vaginal infection   . Headaches, cluster   . Hypertension   . Scoliosis     There are no active problems to display for this patient.   Past Surgical History:  Procedure Laterality Date  . WISDOM TOOTH EXTRACTION       OB History    Gravida  0   Para  0   Term  0   Preterm  0   AB  0   Living  0     SAB  0   TAB  0   Ectopic  0   Multiple  0     Live Births  0            Home Medications    Prior to Admission medications   Medication Sig Start Date End Date Taking? Authorizing Provider  ibuprofen (ADVIL,MOTRIN) 800 MG tablet Take 800 mg by mouth every 8 (eight) hours as needed for headache, moderate pain or cramping.   Yes [provider]  naproxen (NAPROSYN) 500 MG tablet Take 1 tablet (500 mg total) by mouth 2 (two) times daily. Patient taking differently: Take 500 mg by mouth 2 (two) times daily as needed for mild pain.  09/20/18  Yes Neese, Hope M, NP  cephALEXin (KEFLEX) 500 MG capsule Take 2 capsules (1,000 mg total) by mouth 2 (two) times daily. 11/21/18   Arby Barrette, MD  ciprofloxacin (CIPRO) 500 MG tablet Take 0.5 tablets (250 mg total) by mouth every 12 (twelve) hours for 3 days. 11/30/18 12/03/18  Rashiya Lofland S, PA-C  ibuprofen (ADVIL,MOTRIN) 600 MG tablet Take 1 tablet (600 mg total) by mouth every 6 (six) hours as needed. Patient not taking: Reported on 11/30/2018 04/23/18   Elpidio Anis, PA-C  nystatin cream (MYCOSTATIN) Apply to affected area 2 times daily 11/30/18   Cybill Uriegas S, PA-C  Family History Family History  Problem Relation Age of Onset  . Diabetes Other   . Hypertension Other     Social History Social History   Tobacco Use  . Smoking status: Never Smoker  . Smokeless tobacco: Never Used  Substance Use Topics  . Alcohol use: Yes    Comment: socially  . Drug use: Yes    Frequency: 4.0 times per week    Types: Marijuana     Allergies   Penicillins; Morphine and related; and Bactrim [sulfamethoxazole-trimethoprim]   Review of Systems Review of Systems  Constitutional: Negative for chills and fever.  HENT: Negative for ear pain and sore throat.   Eyes: Positive for pain. Negative for visual disturbance.  Respiratory: Negative for cough and shortness of breath.   Cardiovascular: Negative for chest pain.  Gastrointestinal: Negative for abdominal pain, constipation,  diarrhea, nausea and vomiting.  Genitourinary: Positive for dysuria, frequency, urgency and vaginal discharge. Negative for hematuria and vaginal bleeding.       Vaginal irritation  Musculoskeletal: Negative for back pain.  Skin: Negative for rash.  Neurological: Positive for headaches. Negative for dizziness, weakness, light-headedness and numbness.  All other systems reviewed and are negative.    Physical Exam Updated Vital Signs BP 111/77 (BP Location: Left Arm)   Pulse 75   Temp 98.6 F (37 C) (Oral)   Resp 18   Ht  (1.651 m)   Wt 68.9 kg   LMP 11/04/2018 (Exact Date)   SpO2 100%   BMI 25.28 kg/m   Physical Exam Vitals signs and nursing note reviewed.  Constitutional:      General: She is not in acute distress.    Appearance: She is well-developed.  HENT:     Head: Normocephalic and atraumatic.  Eyes:     Conjunctiva/sclera: Conjunctivae normal.  Neck:     Musculoskeletal: Neck supple.  Cardiovascular:     Rate and Rhythm: Normal rate and regular rhythm.     Heart sounds: No murmur.  Pulmonary:     Effort: Pulmonary effort is normal. No respiratory distress.     Breath sounds: Normal breath sounds.  Abdominal:     Palpations: Abdomen is soft.     Tenderness: There is no abdominal tenderness.  Genitourinary:    Comments: Exam performed by Karrie Meres,  exam chaperoned Date: 11/30/2018 Pelvic exam: normal external genitalia without evidence of trauma. VULVA: normal appearing vulva with no masses, tenderness or lesion. VAGINA: normal appearing vagina with normal color, no lesions, white chunky discharge noted CERVIX: normal appearing cervix without lesions, cervical motion tenderness absent, cervical os closed with out purulent discharge; vaginal discharge - present (noted above), Wet prep and DNA probe for chlamydia and GC obtained.   ADNEXA: normal adnexa in size, nontender and no masses UTERUS: uterus is normal size, shape, consistency and nontender.    Skin:    General: Skin is warm and dry.  Neurological:     Mental Status: She is alert.     Cranial Nerves: No cranial nerve deficit.     Comments: Mental Status:  Alert, thought content appropriate, able to give a coherent history. Speech fluent without evidence of aphasia. Able to follow 2 step commands without difficulty.  Motor:  Normal tone. 5/5 strength of BUE and BLE major muscle groups Sensory: light touch normal in all extremities. CV: 2+ radial and DP/PT pulses      ED Treatments / Results  Labs (all labs ordered are listed, but only  abnormal results are displayed) Labs Reviewed  WET PREP, GENITAL - Abnormal; Notable for the following components:      Result Value   Clue Cells Wet Prep HPF POC PRESENT (*)    WBC, Wet Prep HPF POC MANY (*)    All other components within normal limits  URINALYSIS, ROUTINE W REFLEX MICROSCOPIC - Abnormal; Notable for the following components:   APPearance HAZY (*)    Leukocytes,Ua TRACE (*)    Bacteria, UA RARE (*)    All other components within normal limits  URINE CULTURE  I-STAT BETA HCG BLOOD, ED ([facility], [facility], [facility] ONLY)  GC/CHLAMYDIA PROBE AMP ([place]) NOT AT [facility]    EKG None  Radiology No results found.  Procedures Procedures (including critical care time)  Medications Ordered in ED Medications  ketorolac (TORADOL) 15 MG/ML injection 15 mg (15 mg Intravenous Given 11/30/18 1200)  metoCLOPramide (REGLAN) injection 10 mg (10 mg Intravenous Given 11/30/18 1200)  fluconazole (DIFLUCAN) tablet 150 mg (150 mg Oral Given 11/30/18 1207)  sodium chloride 0.9 % bolus 500 mL (0 mLs Intravenous Stopped 11/30/18 1347)  metroNIDAZOLE (FLAGYL) tablet 2,000 mg (2,000 mg Oral Given 11/30/18 1342)  ondansetron (ZOFRAN) injection 4 mg (4 mg Intravenous Given 11/30/18 1342)     Initial Impression / Assessment and Plan / ED Course  I have reviewed the triage vital signs and the nursing notes.  Pertinent labs & imaging results that were available  during my care of the patient were reviewed by me and considered in my medical decision making (see chart for details).      Final Clinical Impressions(s) / ED Diagnoses   Final diagnoses:  BV (bacterial vaginosis)  Urinary tract infection with hematuria, site unspecified  Nonintractable headache, unspecified chronicity pattern, unspecified headache type   Patient is presenting with multiple complaints.  She is complaining of a headache, it is consistent with prior headaches.  Her neurologic exam is normal.  She was given migraine cocktail and fluids and on reevaluation her headache has completely resolved and she feels better.  I doubt acute CVA, SAH, or other acute intracranial abnormality that would require further work-up or admission at this time.  Patient also complaining of urinary and vaginal symptoms.  Is currently being treated for urinary tract infection however was not taking her antibiotics correctly.  She is also complaining of vaginal irritation and thinks she has a yeast infection from taking the antibiotics.  Today she has evidence of trace leukocytes and rare bacteria on her urinalysis however there also 6-10 squamous epithelial cells and therefore question whether this specimen is contaminated.  We will send urine culture.  Given the patient is still symptomatic I will start her on a different antibiotic to treat a persistent infection.  I also completed a pelvic exam to rule out sexually transmitted infection given her complaints of discharge and irritation.  Her wet prep did not show any yeast however clinically she does appear to have a yeast infection on exam and therefore will give dose of fluconazole in the ED and sent home with nystatin cream for symptoms.  Wet prep done demonstrate bacterial vaginosis and patient given dose of Flagyl in ED. Advised her the her std and urine cultures will result in the next several days. Advised her to f/u with women's clinic and to return  for any new or worsening sxs. All questions answered. Pt stable for discharge.  ED Discharge Orders         Ordered  ciprofloxacin (CIPRO) 500 MG tablet  Every 12 hours,   Status:  Discontinued     11/30/18 1355    nystatin cream (MYCOSTATIN)  Status:  Discontinued     11/30/18 1355    ciprofloxacin (CIPRO) 500 MG tablet  Every 12 hours     11/30/18 1359    nystatin cream (MYCOSTATIN)     11/30/18 1359           Karrie Meres, PA-C 11/30/18 1529    Wynetta Fines, MD 12/02/18 2024

## 2018-11-30 NOTE — ED Triage Notes (Signed)
Pt has a migraine for over a day, 800 mg Ibuprofen taken, feels like her recent UTI has not cleared and she reports yeast infection from the antibotics

## 2018-11-30 NOTE — Discharge Instructions (Addendum)
You were given a prescription for antibiotics. Please take the antibiotic prescription fully. Do not drink alcohol while taking this medication.  Please use nystatin cream as directed.   You have been tested for HIV, syphilis, chlamydia and gonorrhea.  These results will be available in approximately 3 days and you will be contacted by the hospital if the results are positive. Avoid sexual contact until you are aware of the results, and please inform all sexual partners if you test positive for any of these diseases.  A culture was sent of your urine today to determine if there is any bacterial growth. If the results of the culture are positive and you require an antibiotic or a change of your prescribed antibiotic you will be contacted by the hospital. If the results are negative you will not be contacted.  Please follow up with your primary care provider within 5-7 days for re-evaluation of your symptoms. If you do not have a primary care provider, information for a healthcare clinic has been provided for you to make arrangements for follow up care. Please return to the emergency department for any new or worsening symptoms.

## 2018-12-01 LAB — URINE CULTURE: Culture: NO GROWTH

## 2018-12-02 LAB — GC/CHLAMYDIA PROBE AMP (~~LOC~~) NOT AT ARMC
Chlamydia: NEGATIVE
Neisseria Gonorrhea: NEGATIVE

## 2018-12-16 ENCOUNTER — Encounter (HOSPITAL_COMMUNITY): Payer: Self-pay

## 2018-12-16 ENCOUNTER — Emergency Department (HOSPITAL_COMMUNITY)
Admission: EM | Admit: 2018-12-16 | Discharge: 2018-12-16 | Disposition: A | Discharge status: Home / Self Care | Payer: Self-pay | Attending: Emergency Medicine | Admitting: Emergency Medicine

## 2018-12-16 ENCOUNTER — Other Ambulatory Visit: Payer: Self-pay

## 2018-12-16 DIAGNOSIS — B3731 Acute candidiasis of vulva and vagina: Secondary | ICD-10-CM

## 2018-12-16 DIAGNOSIS — B373 Candidiasis of vulva and vagina: Secondary | ICD-10-CM | POA: Insufficient documentation

## 2018-12-16 DIAGNOSIS — N898 Other specified noninflammatory disorders of vagina: Secondary | ICD-10-CM | POA: Insufficient documentation

## 2018-12-16 DIAGNOSIS — I10 Essential (primary) hypertension: Secondary | ICD-10-CM | POA: Insufficient documentation

## 2018-12-16 DIAGNOSIS — F1210 Cannabis abuse, uncomplicated: Secondary | ICD-10-CM | POA: Insufficient documentation

## 2018-12-16 MED ORDER — FLUCONAZOLE 200 MG PO TABS
200.0000 mg | ORAL_TABLET | Freq: Once | ORAL | Status: AC
  Administered 2018-12-16: 200 mg via ORAL
  Filled 2018-12-16: qty 1

## 2018-12-16 MED ORDER — NYSTATIN-TRIAMCINOLONE 100000-0.1 UNIT/GM-% EX CREA: TOPICAL_CREAM | CUTANEOUS | 0 refills | Status: DC

## 2018-12-16 MED ORDER — FLUCONAZOLE 200 MG PO TABS: 200.0000 mg | ORAL_TABLET | Freq: Every day | ORAL | 0 refills | Status: AC

## 2018-12-16 MED ORDER — TERCONAZOLE 0.8 % VA CREA: 1.0000 | TOPICAL_CREAM | Freq: Every day | VAGINAL | 0 refills | Status: DC

## 2018-12-16 NOTE — ED Provider Notes (Signed)
[facility]-EMERGENCY DEPT Provider Note   CSN: [id number] Arrival date & time: 12/16/18  1101    History   Chief Complaint Chief Complaint  Patient presents with  . Abdominal Pain  . Urinary Frequency    HPI Janice Duarte is a 28 y.o. female who presents to the ED with c/o lower abdominal pain and vaginal irritation. Patient was here recently and treated with antibiotics and Nystatin. Patient reports she use the Nystantin towels but now the itching is worse and she has the thick white cheesy d/c. Patient reports an occasional mild cramping in the lower abdomen. Patient had complete work up at last visit with pelvic exam and cultures.      HPI  Past Medical History:  Diagnosis Date  . Abscess   . Bacterial vaginal infection   . Headaches, cluster   . Hypertension   . Scoliosis     There are no active problems to display for this patient.   Past Surgical History:  Procedure Laterality Date  . WISDOM TOOTH EXTRACTION       OB History    Gravida  0   Para  0   Term  0   Preterm  0   AB  0   Living  0     SAB  0   TAB  0   Ectopic  0   Multiple  0   Live Births  0            Home Medications    Prior to Admission medications   Medication Sig Start Date End Date Taking? Authorizing Provider  fluconazole (DIFLUCAN) 200 MG tablet Take 1 tablet (200 mg total) by mouth daily for 1 day. 12/18/18 12/19/18  Janne Napoleon, NP  nystatin-triamcinolone [facility] II) cream Apply to affected area BID 12/16/18   Janne Napoleon, NP  terconazole (TERAZOL 3) 0.8 % vaginal cream Place 1 applicator vaginally at bedtime. 12/16/18   Janne Napoleon, NP    Family History Family History  Problem Relation Age of Onset  . Diabetes Other   . Hypertension Other   . Hypertension Mother   . Heart failure Mother   . Hepatitis B Mother     Social History Social History   Tobacco Use  . Smoking status: Never Smoker  . Smokeless tobacco: Never  Used  Substance Use Topics  . Alcohol use: Yes    Comment: socially  . Drug use: Yes    Frequency: 4.0 times per week    Types: Marijuana     Allergies   Penicillins; Morphine and related; and Bactrim [sulfamethoxazole-trimethoprim]   Review of Systems Review of Systems  Gastrointestinal: Abdominal pain: occasional cramping.  Genitourinary: Positive for vaginal discharge and vaginal pain.  All other systems reviewed and are negative.    Physical Exam Updated Vital Signs BP (!) 151/95 (BP Location: Right Arm)   Pulse 95   Temp 98.1 F (36.7 C) (Oral)   Resp 16   Ht 5\' 5"  (1.651 m)   Wt 68.9 kg   LMP 12/01/2018   SpO2 100%   BMI 25.28 kg/m   Physical Exam Vitals signs and nursing note reviewed. Exam conducted with a chaperone present.  Constitutional:      General: She is not in acute distress.    Appearance: She is well-developed.  HENT:     Head: Normocephalic.     Nose: No congestion.  Eyes:     Conjunctiva/sclera: Conjunctivae  normal.  Neck:     Musculoskeletal: Neck supple.  Cardiovascular:     Rate and Rhythm: Normal rate.  Pulmonary:     Effort: Pulmonary effort is normal.  Abdominal:     Palpations: Abdomen is soft.     Tenderness: There is no abdominal tenderness.  Genitourinary:    Comments: External genitalia with erythema and irritation of the mucous membranes of the labia major and labia minora. No lesions noted. There is a thick, white, cheesy discharge noted.  Musculoskeletal: Normal range of motion.  Skin:    General: Skin is warm and dry.  Neurological:     Mental Status: She is alert and oriented to person, place, and time.  Psychiatric:        Mood and Affect: Mood normal.      ED Treatments / Results  Labs (all labs ordered are listed, but only abnormal results are displayed) Labs Reviewed - No data to display  Radiology No results found.  Procedures Procedures (including critical care time)  Medications Ordered in ED  Medications  fluconazole (DIFLUCAN) tablet 200 mg (has no administration in time range)     Initial Impression / Assessment and Plan / ED Course  I have reviewed the triage vital signs and the nursing notes. 28 y.o. female here with vaginal itching and irritation after having been on antibiotics x 2 for UTI. Patient stable for d/c without concern for HSV at this time, there are no lesions present. Patient tested for STD's at last visit and were negative. Will treat for monilia vaginitis and patient to f/u with GYN.   Final Clinical Impressions(s) / ED Diagnoses   Final diagnoses:  Monilial vaginitis    ED Discharge Orders         Ordered    fluconazole (DIFLUCAN) 200 MG tablet  Daily     12/16/18 1148    nystatin-triamcinolone (MYCOLOG II) cream     12/16/18 1148    terconazole (TERAZOL 3) 0.8 % vaginal cream  Daily at bedtime     12/16/18 1148           Damian Leavell Clifton, NP 12/16/18 1159    Mancel Bale, MD 12/18/18 1032

## 2018-12-16 NOTE — ED Triage Notes (Addendum)
Patient c/o lower abdominal pain and vaginal irritation. patient states she has been on antibiotics and using wipes as told to do,but still has  Issues. Patient also c/o urinary frequency.

## 2018-12-16 NOTE — Discharge Instructions (Addendum)
Call the [facility] for follow up.

## 2018-12-16 NOTE — ED Notes (Signed)
Bed: WTR9 Expected date:  Expected time:  Means of arrival:  Comments: 

## 2019-01-21 NOTE — Progress Notes (Signed)
COVID Hotel Screening performed. Temperature, PHQ-9, and need for medical care and medications assessed. PHQ-9=2. Patient given behavioral health resources form. No others needs reported.  Carlyle Basques RN MSN

## 2019-08-03 ENCOUNTER — Inpatient Hospital Stay (HOSPITAL_COMMUNITY): Payer: Medicaid - Out of State

## 2019-08-03 ENCOUNTER — Other Ambulatory Visit: Payer: Self-pay

## 2019-08-03 ENCOUNTER — Inpatient Hospital Stay (HOSPITAL_COMMUNITY)
Admission: EM | Admit: 2019-08-03 | Discharge: 2019-08-03 | Disposition: A | Discharge status: Home / Self Care | Payer: Medicaid - Out of State | Attending: Obstetrics & Gynecology | Admitting: Obstetrics & Gynecology

## 2019-08-03 ENCOUNTER — Encounter (HOSPITAL_COMMUNITY): Payer: Self-pay | Admitting: Emergency Medicine

## 2019-08-03 DIAGNOSIS — Z79899 Other long term (current) drug therapy: Secondary | ICD-10-CM | POA: Diagnosis not present

## 2019-08-03 DIAGNOSIS — M419 Scoliosis, unspecified: Secondary | ICD-10-CM | POA: Insufficient documentation

## 2019-08-03 DIAGNOSIS — Z880 Allergy status to penicillin: Secondary | ICD-10-CM | POA: Diagnosis not present

## 2019-08-03 DIAGNOSIS — O209 Hemorrhage in early pregnancy, unspecified: Secondary | ICD-10-CM | POA: Diagnosis not present

## 2019-08-03 DIAGNOSIS — Z3A01 Less than 8 weeks gestation of pregnancy: Secondary | ICD-10-CM | POA: Diagnosis not present

## 2019-08-03 DIAGNOSIS — Z8249 Family history of ischemic heart disease and other diseases of the circulatory system: Secondary | ICD-10-CM | POA: Insufficient documentation

## 2019-08-03 DIAGNOSIS — Z885 Allergy status to narcotic agent status: Secondary | ICD-10-CM | POA: Diagnosis not present

## 2019-08-03 DIAGNOSIS — N930 Postcoital and contact bleeding: Secondary | ICD-10-CM | POA: Insufficient documentation

## 2019-08-03 DIAGNOSIS — Z833 Family history of diabetes mellitus: Secondary | ICD-10-CM | POA: Diagnosis not present

## 2019-08-03 DIAGNOSIS — O26859 Spotting complicating pregnancy, unspecified trimester: Secondary | ICD-10-CM

## 2019-08-03 DIAGNOSIS — O10911 Unspecified pre-existing hypertension complicating pregnancy, first trimester: Secondary | ICD-10-CM | POA: Insufficient documentation

## 2019-08-03 DIAGNOSIS — O99891 Other specified diseases and conditions complicating pregnancy: Secondary | ICD-10-CM | POA: Insufficient documentation

## 2019-08-03 DIAGNOSIS — Z3490 Encounter for supervision of normal pregnancy, unspecified, unspecified trimester: Secondary | ICD-10-CM

## 2019-08-03 DIAGNOSIS — O26851 Spotting complicating pregnancy, first trimester: Secondary | ICD-10-CM

## 2019-08-03 DIAGNOSIS — Z881 Allergy status to other antibiotic agents status: Secondary | ICD-10-CM | POA: Insufficient documentation

## 2019-08-03 DIAGNOSIS — O10919 Unspecified pre-existing hypertension complicating pregnancy, unspecified trimester: Secondary | ICD-10-CM

## 2019-08-03 LAB — WET PREP, GENITAL
Clue Cells Wet Prep HPF POC: NONE SEEN
Sperm: NONE SEEN
Trich, Wet Prep: NONE SEEN
Yeast Wet Prep HPF POC: NONE SEEN

## 2019-08-03 LAB — CBC WITH DIFFERENTIAL/PLATELET
Abs Immature Granulocytes: 0.03 10*3/uL (ref 0.00–0.07)
Basophils Absolute: 0 10*3/uL (ref 0.0–0.1)
Basophils Relative: 0 %
Eosinophils Absolute: 0 10*3/uL (ref 0.0–0.5)
Eosinophils Relative: 0 %
HCT: 36.7 % (ref 36.0–46.0)
Hemoglobin: 12.2 g/dL (ref 12.0–15.0)
Immature Granulocytes: 0 %
Lymphocytes Relative: 31 %
Lymphs Abs: 3.3 10*3/uL (ref 0.7–4.0)
MCH: 23.7 pg — ABNORMAL LOW (ref 26.0–34.0)
MCHC: 33.2 g/dL (ref 30.0–36.0)
MCV: 71.3 fL — ABNORMAL LOW (ref 80.0–100.0)
Monocytes Absolute: 0.6 10*3/uL (ref 0.1–1.0)
Monocytes Relative: 6 %
Neutro Abs: 6.7 10*3/uL (ref 1.7–7.7)
Neutrophils Relative %: 63 %
Platelets: 366 10*3/uL (ref 150–400)
RBC: 5.15 MIL/uL — ABNORMAL HIGH (ref 3.87–5.11)
RDW: 17.1 % — ABNORMAL HIGH (ref 11.5–15.5)
WBC: 10.7 10*3/uL — ABNORMAL HIGH (ref 4.0–10.5)
nRBC: 0 % (ref 0.0–0.2)

## 2019-08-03 LAB — HCG, QUANTITATIVE, PREGNANCY: hCG, Beta Chain, Quant, S: 96177 m[IU]/mL — ABNORMAL HIGH (ref <5)

## 2019-08-03 LAB — URINALYSIS, ROUTINE W REFLEX MICROSCOPIC
Bilirubin Urine: NEGATIVE
Glucose, UA: NEGATIVE mg/dL
Hgb urine dipstick: NEGATIVE
Ketones, ur: 20 mg/dL — AB
Leukocytes,Ua: NEGATIVE
Nitrite: NEGATIVE
Protein, ur: NEGATIVE mg/dL
Specific Gravity, Urine: 1.017 (ref 1.005–1.030)
pH: 6 (ref 5.0–8.0)

## 2019-08-03 LAB — POC URINE PREG, ED: Preg Test, Ur: POSITIVE — AB

## 2019-08-03 LAB — ABO/RH: ABO/RH(D): AB POS

## 2019-08-03 LAB — HIV ANTIBODY (ROUTINE TESTING W REFLEX): HIV Screen 4th Generation wRfx: NONREACTIVE

## 2019-08-03 NOTE — MAU Note (Signed)
Pt states she has left sided flank pain. Also says vaginal bleeding began after intercourse.

## 2019-08-03 NOTE — ED Triage Notes (Signed)
Pt reports LMP Sept 3rd. Had positive home pregnaqncy test yesterday. Noted blood after intercourse.

## 2019-08-03 NOTE — MAU Provider Note (Signed)
History     CSN: [id number]  Arrival date and time: 08/03/19 1155   First Provider Initiated Contact with Patient 08/03/19 1618      Chief Complaint  Patient presents with  . Possible Pregnancy  . Vaginal Bleeding   HPI  Ms.  Janice Duarte is a 28 y.o. year old [id number] female at 4821w5d weeks gestation who presents to MAU reporting vaginal bleeding after SI this AM and mid-back pain. She denies any abdominal cramping, but reports the mid-back pain being so bad that she has to "hold her back". She denies any dysuria or abnormal vaginal discharge.    Past Medical History:  Diagnosis Date  . Abscess   . Bacterial vaginal infection   . Headaches, cluster   . Hypertension   . Scoliosis     Past Surgical History:  Procedure Laterality Date  . WISDOM TOOTH EXTRACTION      Family History  Problem Relation Age of Onset  . Diabetes Other   . Hypertension Other   . Hypertension Mother   . Heart failure Mother   . Hepatitis B Mother     Social History   Tobacco Use  . Smoking status: Never Smoker  . Smokeless tobacco: Never Used  Substance Use Topics  . Alcohol use: Yes    Comment: socially  . Drug use: Yes    Frequency: 4.0 times per week    Types: Marijuana    Allergies:  Allergies  Allergen Reactions  . Penicillins Hives    Has patient had a PCN reaction causing immediate rash, facial/tongue/throat swelling, SOB or lightheadedness with hypotension: Yes Has patient had a PCN reaction causing severe rash involving mucus membranes or skin necrosis: Yes Has patient had a PCN reaction that required hospitalization No Has patient had a PCN reaction occurring within the last 10 years: Yes  If all of the above answers are "NO", then may proceed with Cephalosporin use.   Marland Kitchen. Morphine And Related Rash  . Bactrim [Sulfamethoxazole-Trimethoprim] Hives    Medications Prior to Admission  Medication Sig Dispense Refill Last Dose  . acetaminophen (TYLENOL) 500 MG tablet  Take 500 mg by mouth every 6 (six) hours as needed for mild pain.   08/02/2019 at Unknown time  . nystatin-triamcinolone (MYCOLOG II) cream Apply to affected area BID 15 g 0   . terconazole (TERAZOL 3) 0.8 % vaginal cream Place 1 applicator vaginally at bedtime. 20 g 0     Review of Systems Physical Exam   Patient Vitals for the past 24 hrs:  BP Temp Temp src Pulse Resp SpO2 Height Weight  08/03/19 1647 (!) 151/88 - - - - - - -  08/03/19 1618 (!) 151/88 - - 71 - - - -  08/03/19 1521 (!) 128/91 - - 78 - - - -  08/03/19 1323 130/81 98.4 F (36.9 C) Oral 77 20 100 % 5\' 5"  (1.651 m) 65.2 kg  08/03/19 1202 - - - - - - 5\' 5"  (1.651 m) 68 kg  08/03/19 1201 (!) 157/113 98.4 F (36.9 C) Oral 87 16 100 % - -    Physical Exam  MAU Course  Procedures  MDM CCUA UPT CBC ABO/Rh HCG Wet Prep GC/CT -- pending HIV -- pending OB < 14 wks US with TV UCx -- results pending  Results for orders placed or performed during the hospital encounter of 08/03/19 (from the past 24 hour(s))  POC Urine Pregnancy, [facility])  Status: Abnormal   Collection Time: 08/03/19 12:14 PM  Result Value Ref Range   Preg Test, Ur POSITIVE (A) NEGATIVE  CBC with Differential/Platelet     Status: Abnormal   Collection Time: 08/03/19  1:40 PM  Result Value Ref Range   WBC 10.7 (H) 4.0 - 10.5 K/uL   RBC 5.15 (H) 3.87 - 5.11 MIL/uL   Hemoglobin 12.2 12.0 - 15.0 g/dL   HCT 36.7 36.0 - 46.0 %   MCV 71.3 (L) 80.0 - 100.0 fL   MCH 23.7 (L) 26.0 - 34.0 pg   MCHC 33.2 30.0 - 36.0 g/dL   RDW 17.1 (H) 11.5 - 15.5 %   Platelets 366 150 - 400 K/uL   nRBC 0.0 0.0 - 0.2 %   Neutrophils Relative % 63 %   Neutro Abs 6.7 1.7 - 7.7 K/uL   Lymphocytes Relative 31 %   Lymphs Abs 3.3 0.7 - 4.0 K/uL   Monocytes Relative 6 %   Monocytes Absolute 0.6 0.1 - 1.0 K/uL   Eosinophils Relative 0 %   Eosinophils Absolute 0.0 0.0 - 0.5 K/uL   Basophils Relative 0 %   Basophils Absolute 0.0 0.0 - 0.1 K/uL   Immature  Granulocytes 0 %   Abs Immature Granulocytes 0.03 0.00 - 0.07 K/uL  hCG, quantitative, pregnancy     Status: Abnormal   Collection Time: 08/03/19  1:40 PM  Result Value Ref Range   hCG, Beta Chain, Quant, S 96,177 (H) <5 mIU/mL  ABO/Rh     Status: None   Collection Time: 08/03/19  1:40 PM  Result Value Ref Range   ABO/RH(D) AB POS    No rh immune globuloin      NOT A RH IMMUNE GLOBULIN CANDIDATE, PT RH POSITIVE Performed at [facility], [address] [place], [place] [address]   HIV Antibody (routine testing w rflx)     Status: None   Collection Time: 08/03/19  1:40 PM  Result Value Ref Range   HIV Screen 4th Generation wRfx NON REACTIVE NON REACTIVE  Wet prep, genital     Status: Abnormal   Collection Time: 08/03/19  2:20 PM   Specimen: PATH Cytology Cervicovaginal Ancillary Only  Result Value Ref Range   Yeast Wet Prep HPF POC NONE SEEN NONE SEEN   Trich, Wet Prep NONE SEEN NONE SEEN   Clue Cells Wet Prep HPF POC NONE SEEN NONE SEEN   WBC, Wet Prep HPF POC FEW (A) NONE SEEN   Sperm NONE SEEN   Urinalysis, Routine w reflex microscopic     Status: Abnormal   Collection Time: 08/03/19  3:25 PM  Result Value Ref Range   Color, Urine YELLOW YELLOW   APPearance CLEAR CLEAR   Specific Gravity, Urine 1.017 1.005 - 1.030   pH 6.0 5.0 - 8.0   Glucose, UA NEGATIVE NEGATIVE mg/dL   Hgb urine dipstick NEGATIVE NEGATIVE   Bilirubin Urine NEGATIVE NEGATIVE   Ketones, ur 20 (A) NEGATIVE mg/dL   Protein, ur NEGATIVE NEGATIVE mg/dL   Nitrite NEGATIVE NEGATIVE   Leukocytes,Ua NEGATIVE NEGATIVE    US Ob Less Than 14 Weeks With Ob Transvaginal  Result Date: 08/03/2019 CLINICAL DATA:  Vaginal bleeding in 1st trimester pregnancy. EXAM: OBSTETRIC <14 WK [place] AND TRANSVAGINAL OB US TECHNIQUE: Both transabdominal and transvaginal ultrasound examinations were performed for complete evaluation of the gestation as well as the maternal uterus, adnexal regions, and pelvic cul-de-sac.  Transvaginal technique was performed to assess early  pregnancy. COMPARISON:  None. FINDINGS: Intrauterine gestational sac: Single Yolk sac:  Visualized. Embryo:  Visualized. Cardiac Activity: Visualized. Heart Rate: 179 bpm CRL:  30 mm   9 w   5 d                  [place] EDC: 03/02/2020 Subchorionic hemorrhage:  None visualized. Maternal uterus/adnexae: Normal appearance of both ovaries. No mass or abnormal free fluid identified. IMPRESSION: Single living IUP measuring 9 weeks 5 days, with [place] EDC of 03/02/2020. No maternal uterine or adnexal abnormality identified. Electronically Signed   By: Danae Orleans M.D.   On: 08/03/2019 15:49    *Consult with Dr. 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 - Note given to be OOW until 08/05/2019 - After consult with Dr. Debroah Loop, no HTN medication will be started at this time - Will monitor at NOB appt - Information provided on HTN in pregnancy, managing HTN - Discussed cHTN classifies as a high risk pregnancy and will need to be followed by our HR[facility] office  Spotting in early pregnancy - Advised that postcoital bleeding in pregnancy is a normal variance - Return to MAU:  If you have heavier bleeding that soaks through more that 2 pads per hour for an hour or more  If you bleed so much that you feel like you might pass out or you do pass out  If you have significant abdominal pain that is not improved with Tylenol   If you develop a fever > 100.5 - Information provided on VB during 1st trimester and pregnancy & sex   Intrauterine pregnancy  - Advised that lab results and U/S were normal and shows baby is doing well today, despite the episode of  spotting she had. - Information provided on 1st trimester pregnancy   - Discharge patient - Advised that someone from [facility] will call her to get her NOB appt scheduled within the next 2 wks - Patient verbalized an understanding of the plan of care and agrees.     Raelyn Mora, MSN, CNM 08/03/2019, 4:32 PM

## 2019-08-03 NOTE — MAU Note (Signed)
Presents with c/o VB after intercourse.  Denies abdominal cramping, but c/o lower back pain.

## 2019-08-04 LAB — RPR: RPR Ser Ql: NONREACTIVE

## 2019-08-04 LAB — GC/CHLAMYDIA PROBE AMP (~~LOC~~) NOT AT ARMC
Chlamydia: NEGATIVE
Comment: NEGATIVE
Comment: NORMAL
Neisseria Gonorrhea: NEGATIVE

## 2019-08-06 LAB — CULTURE, OB URINE: Culture: 50000 — AB

## 2019-08-07 ENCOUNTER — Telehealth: Payer: Self-pay | Admitting: Certified Nurse Midwife

## 2019-08-07 DIAGNOSIS — N390 Urinary tract infection, site not specified: Secondary | ICD-10-CM

## 2019-08-07 DIAGNOSIS — B9620 Unspecified Escherichia coli [E. coli] as the cause of diseases classified elsewhere: Secondary | ICD-10-CM

## 2019-08-07 MED ORDER — CEPHALEXIN 500 MG PO CAPS: 500.0000 mg | ORAL_CAPSULE | Freq: Four times a day (QID) | ORAL | 0 refills | Status: DC

## 2019-08-07 NOTE — Telephone Encounter (Signed)
Patient called to discuss urine culture results. Patient identification verified prior to result discussed. Patient reports she is currently in [facility] and would like the prescription sent to a pharmacy in [place] preferred. Rx sent to pharmacy of choice.   Lajean Manes, CNM 08/07/19, 9:15 AM

## 2019-08-12 LAB — CYTOLOGY - PAP: Pap: NEGATIVE

## 2019-08-29 ENCOUNTER — Other Ambulatory Visit: Payer: Self-pay

## 2019-08-29 ENCOUNTER — Ambulatory Visit (INDEPENDENT_AMBULATORY_CARE_PROVIDER_SITE_OTHER): Payer: Medicaid - Out of State | Admitting: *Deleted

## 2019-08-29 DIAGNOSIS — O0990 Supervision of high risk pregnancy, unspecified, unspecified trimester: Secondary | ICD-10-CM

## 2019-08-29 DIAGNOSIS — O26859 Spotting complicating pregnancy, unspecified trimester: Secondary | ICD-10-CM

## 2019-08-29 DIAGNOSIS — O10919 Unspecified pre-existing hypertension complicating pregnancy, unspecified trimester: Secondary | ICD-10-CM

## 2019-08-29 DIAGNOSIS — Z3490 Encounter for supervision of normal pregnancy, unspecified, unspecified trimester: Secondary | ICD-10-CM

## 2019-08-29 NOTE — Progress Notes (Signed)
I connected with  Janice Duarte on 08/29/19 at  3:30 PM EST by telephone and verified that I am speaking with the correct person using two identifiers.   I discussed the limitations, risks, security and privacy concerns of performing an evaluation and management service by telephone and the availability of in person appointments. I also discussed with the patient that there may be a patient responsible charge related to this service. The patient expressed understanding and agreed to proceed. Explained I am completing her New OB Intake today. We discussed Her EDD and that it is based on  sure LMP . I reviewed her allergies, meds, OB History, Medical /Surgical history, and appropriate screenings. I explained I will send her the [employer] app- app sent to her while on phone.  I explained we will send a blood pressure cuff to [facility] once she has active [place] Medicaid. Explained  then we will have her take her blood pressure weekly and enter into the app. Explained she will have some visits in office and some virtually. I sent her a MyChart text and she was unable to set up. I gave her the MyChart help desk number. I reviewed her new ob  appointment date/ time with her , our location and to wear mask, no visitors. Explained she will have exam, ob bloodwork, hemoglobin a1C, cbg , genetic testing if desired, pap if needed.She states she had a pap recently and I asked her to sign a release for pap results when she comes to the office for new ob visit.  I scheduled an [place] at 19 weeks and gave her the appointment. She voices understanding.   Fama Muenchow,RN 08/29/2019  3:31 PM

## 2019-08-29 NOTE — Patient Instructions (Signed)

## 2019-09-01 ENCOUNTER — Other Ambulatory Visit: Payer: Self-pay

## 2019-09-01 ENCOUNTER — Inpatient Hospital Stay (HOSPITAL_COMMUNITY)
Admission: AD | Admit: 2019-09-01 | Discharge: 2019-09-01 | Disposition: A | Discharge status: Home / Self Care | Payer: Medicaid Other | Attending: Obstetrics & Gynecology | Admitting: Obstetrics & Gynecology

## 2019-09-01 ENCOUNTER — Encounter (HOSPITAL_COMMUNITY): Payer: Self-pay

## 2019-09-01 DIAGNOSIS — O10912 Unspecified pre-existing hypertension complicating pregnancy, second trimester: Secondary | ICD-10-CM | POA: Insufficient documentation

## 2019-09-01 DIAGNOSIS — R102 Pelvic and perineal pain: Secondary | ICD-10-CM | POA: Diagnosis not present

## 2019-09-01 DIAGNOSIS — O0992 Supervision of high risk pregnancy, unspecified, second trimester: Secondary | ICD-10-CM | POA: Diagnosis not present

## 2019-09-01 DIAGNOSIS — O26852 Spotting complicating pregnancy, second trimester: Secondary | ICD-10-CM

## 2019-09-01 DIAGNOSIS — K5900 Constipation, unspecified: Secondary | ICD-10-CM | POA: Diagnosis not present

## 2019-09-01 DIAGNOSIS — R519 Headache, unspecified: Secondary | ICD-10-CM | POA: Insufficient documentation

## 2019-09-01 DIAGNOSIS — O99612 Diseases of the digestive system complicating pregnancy, second trimester: Secondary | ICD-10-CM | POA: Insufficient documentation

## 2019-09-01 DIAGNOSIS — Z79899 Other long term (current) drug therapy: Secondary | ICD-10-CM | POA: Diagnosis not present

## 2019-09-01 DIAGNOSIS — G44209 Tension-type headache, unspecified, not intractable: Secondary | ICD-10-CM

## 2019-09-01 DIAGNOSIS — Z3A14 14 weeks gestation of pregnancy: Secondary | ICD-10-CM | POA: Diagnosis not present

## 2019-09-01 DIAGNOSIS — Z3490 Encounter for supervision of normal pregnancy, unspecified, unspecified trimester: Secondary | ICD-10-CM

## 2019-09-01 DIAGNOSIS — O10919 Unspecified pre-existing hypertension complicating pregnancy, unspecified trimester: Secondary | ICD-10-CM

## 2019-09-01 DIAGNOSIS — O26892 Other specified pregnancy related conditions, second trimester: Secondary | ICD-10-CM | POA: Insufficient documentation

## 2019-09-01 DIAGNOSIS — O99322 Drug use complicating pregnancy, second trimester: Secondary | ICD-10-CM

## 2019-09-01 DIAGNOSIS — F1290 Cannabis use, unspecified, uncomplicated: Secondary | ICD-10-CM | POA: Diagnosis not present

## 2019-09-01 DIAGNOSIS — O0990 Supervision of high risk pregnancy, unspecified, unspecified trimester: Secondary | ICD-10-CM

## 2019-09-01 DIAGNOSIS — O26859 Spotting complicating pregnancy, unspecified trimester: Secondary | ICD-10-CM

## 2019-09-01 DIAGNOSIS — N949 Unspecified condition associated with female genital organs and menstrual cycle: Secondary | ICD-10-CM

## 2019-09-01 LAB — URINALYSIS, ROUTINE W REFLEX MICROSCOPIC
Bilirubin Urine: NEGATIVE
Glucose, UA: NEGATIVE mg/dL
Hgb urine dipstick: NEGATIVE
Ketones, ur: NEGATIVE mg/dL
Nitrite: NEGATIVE
Protein, ur: NEGATIVE mg/dL
Specific Gravity, Urine: 1.013 (ref 1.005–1.030)
pH: 7 (ref 5.0–8.0)

## 2019-09-01 LAB — OB RESULTS CONSOLE GBS: GBS: POSITIVE

## 2019-09-01 MED ORDER — DIPHENHYDRAMINE HCL 50 MG/ML IJ SOLN
25.0000 mg | Freq: Once | INTRAMUSCULAR | Status: AC
  Administered 2019-09-01: 25 mg via INTRAVENOUS
  Filled 2019-09-01: qty 1

## 2019-09-01 MED ORDER — METOCLOPRAMIDE HCL 5 MG/ML IJ SOLN
10.0000 mg | Freq: Once | INTRAMUSCULAR | Status: AC
  Administered 2019-09-01: 10 mg via INTRAVENOUS
  Filled 2019-09-01: qty 2

## 2019-09-01 MED ORDER — LACTATED RINGERS IV BOLUS
1000.0000 mL | Freq: Once | INTRAVENOUS | Status: AC
  Administered 2019-09-01: 1000 mL via INTRAVENOUS

## 2019-09-01 MED ORDER — DEXAMETHASONE SODIUM PHOSPHATE 10 MG/ML IJ SOLN
10.0000 mg | Freq: Once | INTRAMUSCULAR | Status: AC
  Administered 2019-09-01: 10 mg via INTRAVENOUS
  Filled 2019-09-01: qty 1

## 2019-09-01 NOTE — MAU Note (Signed)
Janice Duarte is a 28 y.o. at [redacted]w[redacted]d here in MAU reporting: headache since yesterday. States normally when she gets a headache it resolves after rest but it has not gotten better. Took 1000mg  of tylenol with no relief, took them around 0400. When she stands up she feels dizzy. Also having sharp abdominal pains since last night. Pains are intermittent and worse with movement. No bleeding, lof, or discharge.  Onset of complaint: yesterday  Pain score: headache 10/10, abdominal pain 6/10  Vitals:   09/01/19 0809  BP: 125/72  Pulse: 96  Resp: 16  Temp: 98.6 F (37 C)  SpO2: 100%     FHT:156  Lab orders placed from triage: UA

## 2019-09-01 NOTE — MAU Provider Note (Addendum)
History     CSN: [id number]  Arrival date and time: 09/01/19 1324   First Provider Initiated Contact with Patient 09/01/19 [phone]      Chief Complaint  Patient presents with  . Abdominal Pain  . Headache   28 y.o. G1 @14 .0 wks presenting with HA. HA started yesterday. Describes as constant, located in occipital region. Rates 10/10. She tried Tylenol and rest but it didn't help. Endorses nausea but no nausea. Admits to MJ use daily "so I can eat". Denies VB and vaginal discharge but reports intermittent sharp, bilateral abd pain. Pain is worse when she stands. Denies fevers. Reports constipation, last BM yesterday was small and hard. She is eating and drinking well but had no water intake today. Reports hx of migraine HA prior to pregnancy.  OB History    Gravida  1   Para  0   Term  0   Preterm  0   AB  0   Living  0     SAB  0   TAB  0   Ectopic  0   Multiple  0   Live Births  0           Past Medical History:  Diagnosis Date  . Abscess   . Bacterial vaginal infection   . Headaches, cluster   . Hypertension   . Scoliosis   . Scoliosis     Past Surgical History:  Procedure Laterality Date  . WISDOM TOOTH EXTRACTION      Family History  Problem Relation Age of Onset  . Diabetes Other   . Hypertension Other   . Hypertension Mother   . Heart failure Mother   . Hepatitis B Mother   . Cirrhosis Mother   . Hypertension Paternal Aunt     Social History   Tobacco Use  . Smoking status: Never Smoker  . Smokeless tobacco: Never Used  Substance Use Topics  . Alcohol use: Not Currently    Comment: socially  . Drug use: Yes    Frequency: 4.0 times per week    Types: Marijuana    Comment: last was 08/31/19    Allergies:  Allergies  Allergen Reactions  . Penicillins Hives    Has patient had a PCN reaction causing immediate rash, facial/tongue/throat swelling, SOB or lightheadedness with hypotension: Yes Has patient had a PCN reaction causing  severe rash involving mucus membranes or skin necrosis: Yes Has patient had a PCN reaction that required hospitalization No Has patient had a PCN reaction occurring within the last 10 years: Yes  If all of the above answers are "NO", then may proceed with Cephalosporin use.   14/2/20 Morphine And Related Rash  . Bactrim [Sulfamethoxazole-Trimethoprim] Hives    Medications Prior to Admission  Medication Sig Dispense Refill Last Dose  . acetaminophen (TYLENOL) 500 MG tablet Take 500 mg by mouth every 6 (six) hours as needed for mild pain.     . cephALEXin (KEFLEX) 500 MG capsule Take 1 capsule (500 mg total) by mouth 4 (four) times daily. 28 capsule 0   . nystatin-triamcinolone (MYCOLOG II) cream Apply to affected area BID 15 g 0   . ondansetron (ZOFRAN) 4 MG tablet Take by mouth.     . Prenatal MV-Min-FA-Omega-3 (PRENATAL GUMMIES/DHA & FA PO) Take 2 tablets by mouth daily.     Marland Kitchen terconazole (TERAZOL 3) 0.8 % vaginal cream Place 1 applicator vaginally at bedtime. 20 g 0     Review of Systems  Eyes: Negative for visual disturbance.  Gastrointestinal: Positive for abdominal pain and nausea. Negative for vomiting.  Genitourinary: Negative for dysuria, frequency, hematuria, urgency, vaginal bleeding and vaginal discharge.  Neurological: Positive for headaches. Negative for syncope.   Physical Exam   Blood pressure 125/72, pulse 96, temperature 98.6 F (37 C), temperature source Oral, resp. rate 16, height 5\' 5"  (1.651 m), weight 69.9 kg, last menstrual period 05/26/2019, SpO2 100 %.  Physical Exam  Nursing note and vitals reviewed. Constitutional: She is oriented to person, place, and time. She appears well-developed and well-nourished. No distress.  HENT:  Head: Normocephalic and atraumatic.  Neck: Normal range of motion.  Cardiovascular: Normal rate.  Respiratory: Effort normal. No respiratory distress.  GI: Soft. She exhibits no distension and no mass. There is abdominal tenderness in  the left lower quadrant. There is no rebound and no guarding.  Genitourinary:    Genitourinary Comments: SVE closed/long   Musculoskeletal: Normal range of motion.  Neurological: She is alert and oriented to person, place, and time. No cranial nerve deficit.  Skin: Skin is warm and dry.  Psychiatric: She has a normal mood and affect.  FHT 156  Results for orders placed or performed during the hospital encounter of 09/01/19 (from the past 24 hour(s))  Urinalysis, Routine w reflex microscopic     Status: Abnormal   Collection Time: 09/01/19  8:21 AM  Result Value Ref Range   Color, Urine YELLOW YELLOW   APPearance CLEAR CLEAR   Specific Gravity, Urine 1.013 1.005 - 1.030   pH 7.0 5.0 - 8.0   Glucose, UA NEGATIVE NEGATIVE mg/dL   Hgb urine dipstick NEGATIVE NEGATIVE   Bilirubin Urine NEGATIVE NEGATIVE   Ketones, ur NEGATIVE NEGATIVE mg/dL   Protein, ur NEGATIVE NEGATIVE mg/dL   Nitrite NEGATIVE NEGATIVE   Leukocytes,Ua SMALL (A) NEGATIVE   RBC / HPF 0-5 0 - 5 RBC/hpf   WBC, UA 6-10 0 - 5 WBC/hpf   Bacteria, UA RARE (A) NONE SEEN   Squamous Epithelial / LPF 0-5 0 - 5   Mucus PRESENT    MAU Course  Procedures Meds ordered this encounter  Medications  . lactated ringers bolus 1,000 mL  . metoCLOPramide (REGLAN) injection 10 mg  . dexamethasone (DECADRON) injection 10 mg  . diphenhydrAMINE (BENADRYL) injection 25 mg   MDM Labs ordered and reviewed. HA resolved after meds. Recently treated for UTI, completed abx, asymptomatic today, will send UC for TOC. No evidence of impending SAB, pain likely RL vs constipation. Stable for discharge home.  Assessment and Plan  [redacted] weeks gestation Non-intractable tension HA Drug use in pregnancy Round ligament pain Constipation Discharge home [facility] SAB precautions Increase dietary fiber and water intake  Allergies as of 09/01/2019      Reactions   Penicillins Hives   Has patient had a PCN reaction causing  immediate rash, facial/tongue/throat swelling, SOB or lightheadedness with hypotension: Yes Has patient had a PCN reaction causing severe rash involving mucus membranes or skin necrosis: Yes Has patient had a PCN reaction that required hospitalization No Has patient had a PCN reaction occurring within the last 10 years: Yes  If all of the above answers are "NO", then may proceed with Cephalosporin use.   Morphine And Related Rash   Bactrim [sulfamethoxazole-trimethoprim] Hives      Medication List    STOP taking these medications   cephALEXin 500 MG capsule Commonly known as: KEFLEX     TAKE  these medications   acetaminophen 500 MG tablet Commonly known as: TYLENOL Take 500 mg by mouth every 6 (six) hours as needed for mild pain.   nystatin-triamcinolone cream Commonly known as: MYCOLOG II Apply to affected area BID   ondansetron 4 MG tablet Commonly known as: ZOFRAN Take by mouth.   PRENATAL GUMMIES/DHA & FA PO Take 2 tablets by mouth daily.   terconazole 0.8 % vaginal cream Commonly known as: Terazol 3 Place 1 applicator vaginally at bedtime.       Julianne Handler, CNM 09/01/2019, 10:18 AM

## 2019-09-01 NOTE — Discharge Instructions (Signed)
Tension Headache, Adult A tension headache is pain, pressure, or aching in your head. Tension headaches can last from 30 minutes to several days. Follow these instructions at home: Managing pain  Take over-the-counter and prescription medicines only as told by your doctor.  When you have a headache, lie down in a dark, quiet room.  If told, put ice on your head and neck: ? Put ice in a plastic bag. ? Place a towel between your skin and the bag. ? Leave the ice on for 20 minutes, 2-3 times a day.  If told, put heat on the back of your neck. Do this as often as your doctor tells you to. Use the kind of heat that your doctor recommends, such as a moist heat pack or a heating pad. ? Place a towel between your skin and the heat. ? Leave the heat on for 20-30 minutes. ? Remove the heat if your skin turns bright red. Eating and drinking  Eat meals on a regular schedule.  Watch how much alcohol you drink: ? If you are a woman and are not pregnant, do not drink more than 1 drink a day. ? If you are a man, do not drink more than 2 drinks a day.  Drink enough fluid to keep your pee (urine) pale yellow.  Do not use a lot of caffeine, or stop using caffeine. Lifestyle  Get enough sleep. Get 7-9 hours of sleep each night. Or get the amount of sleep that your doctor tells you to.  At bedtime, remove all electronic devices from your room. Examples of electronic devices are computers, phones, and tablets.  Find ways to lessen your stress. Some things that can lessen stress are: ? Exercise. ? Deep breathing. ? Yoga. ? Music. ? Positive thoughts.  Sit up straight. Do not tighten (tense) your muscles.  Do not use any products that have nicotine or tobacco in them, such as cigarettes and e-cigarettes. If you need help quitting, ask your doctor. General instructions   Keep all follow-up visits as told by your doctor. This is important.  Avoid things that can bring on headaches. Keep a  journal to find out if certain things bring on headaches. For example, write down: ? What you eat and drink. ? How much sleep you get. ? Any change to your diet or medicines. Contact a doctor if:  Your headache does not get better.  Your headache comes back.  You have a headache and sounds, light, or smells bother you.  You feel sick to your stomach (nauseous) or you throw up (vomit).  Your stomach hurts. Get help right away if:  You suddenly get a very bad headache along with any of these: ? A stiff neck. ? Feeling sick to your stomach. ? Throwing up. ? Feeling weak. ? Trouble seeing. ? Feeling short of breath. ? A rash. ? Feeling unusually sleepy. ? Trouble speaking. ? Pain in your eye or ear. ? Trouble walking or balancing. ? Feeling like you will pass out (faint). ? Passing out. Summary  A tension headache is pain, pressure, or aching in your head.  Tension headaches can last from 30 minutes to several days.  Lifestyle changes and medicines may help relieve pain. This information is not intended to replace advice given to you by your health care provider. Make sure you discuss any questions you have with your health care provider. Document Released: 12/10/2009 Document Revised: 08/28/2017 Document Reviewed: 12/26/2016 Elsevier Patient Education  2020 Elsevier   Inc.  Round Ligament Pain  The round ligament is a cord of muscle and tissue that helps support the uterus. It can become a source of pain during pregnancy if it becomes stretched or twisted as the baby grows. The pain usually begins in the second trimester (13-28 weeks) of pregnancy, and it can come and go until the baby is delivered. It is not a serious problem, and it does not cause harm to the baby. Round ligament pain is usually a short, sharp, and pinching pain, but it can also be a dull, lingering, and aching pain. The pain is felt in the lower side of the abdomen or in the groin. It usually starts deep  in the groin and moves up to the outside of the hip area. The pain may occur when you:  Suddenly change position, such as quickly going from a sitting to standing position.  Roll over in bed.  Cough or sneeze.  Do physical activity. Follow these instructions at home:   Watch your condition for any changes.  When the pain starts, relax. Then try any of these methods to help with the pain: ? Sitting down. ? Flexing your knees up to your abdomen. ? Lying on your side with one pillow under your abdomen and another pillow between your legs. ? Sitting in a warm bath for 15-20 minutes or until the pain goes away.  Take over-the-counter and prescription medicines only as told by your health care provider.  Move slowly when you sit down or stand up.  Avoid long walks if they cause pain.  Stop or reduce your physical activities if they cause pain.  Keep all follow-up visits as told by your health care provider. This is important. Contact a health care provider if:  Your pain does not go away with treatment.  You feel pain in your back that you did not have before.  Your medicine is not helping. Get help right away if:  You have a fever or chills.  You develop uterine contractions.  You have vaginal bleeding.  You have nausea or vomiting.  You have diarrhea.  You have pain when you urinate. Summary  Round ligament pain is felt in the lower abdomen or groin. It is usually a short, sharp, and pinching pain. It can also be a dull, lingering, and aching pain.  This pain usually begins in the second trimester (13-28 weeks). It occurs because the uterus is stretching with the growing baby, and it is not harmful to the baby.  You may notice the pain when you suddenly change position, when you cough or sneeze, or during physical activity.  Relaxing, flexing your knees to your abdomen, lying on one side, or taking a warm bath may help to get rid of the pain.  Get help from your  health care provider if the pain does not go away or if you have vaginal bleeding, nausea, vomiting, diarrhea, or painful urination. This information is not intended to replace advice given to you by your health care provider. Make sure you discuss any questions you have with your health care provider. Document Released: 06/24/2008 Document Revised: 03/03/2018 Document Reviewed: 03/03/2018 Elsevier Patient Education  2020 [employer]   Marijuana Use During Pregnancy and Breastfeeding  Marijuana is the dried leaves, flowers, and stems of the Cannabis sativa or Cannabis indica plant. The plant's active ingredients (cannabinoids), including a chemical called THC, change the chemistry of the brain. Marijuana smoke also has many of the same chemicals  as cigarette smoke that cause breathing problems. Marijuana gets into your blood through your lungs when you smoke it and through your digestive system when you swallow it. Using marijuana in any form may be harmful for you and your baby when you are trying to become pregnant and during pregnancy. This includes marijuana that is prescribed to you by a health care provider (medical marijuana). Once marijuana is in your blood, it can travel through your placenta to your baby. It may also pass through breast milk. How does this affect me? Marijuana affects you both mentally and physically. Using marijuana can make you feel high and relaxed. It can also have negative effects, especially at high doses or with long-term use. These include:  Rapid heartbeat and stress on your heart.  Lung irritation and breathing problems.  Difficulty thinking and making decisions.  Seeing or believing things that are not true (hallucinations and paranoia).  Mood swings, depression, or anxiety.  Decreased ability to learn and remember.  Difficulty getting pregnant. Marijuana can also affect your pregnancy. Not all the effects are known. However, if you use marijuana  during pregnancy, you may:  Be less likely to get regular prenatal care and do the things that you need to do to have a healthy pregnancy.  Be more likely to use other drugs that can harm your pregnancy, like drinking alcohol and smoking cigarettes.  Be at higher risk of having your baby die after 28 weeks of pregnancy (stillbirth).  Be at higher risk of giving birth before 37 weeks of pregnancy (premature birth). How does this affect my baby? If you use marijuana during pregnancy, this may affect your baby's development, birth, and life after birth. Your baby may:  Be born prematurely, which can cause physical and mental problems.  Be born with a low birth weight, which can lead to physical and mental problems.  Have problems with brain development.  Have difficulty growing.  Have attention and behavior problems later in life.  Do poorly at school and have learning problems later in life.  Have problems with vision and coordination.  Be at higher risk for using marijuana by age 42. More research is needed to find out exactly how marijuana affects a baby during breastfeeding. Some studies suggest that the chemicals in marijuana can be passed to a baby through breast milk. To limit possible risks, you should not use marijuana during breastfeeding. Follow these instructions at home:  Let your health care provider know if you use marijuana before trying to get pregnant, during pregnancy, or during breastfeeding.  Do not use marijuana in any form when you are trying to get pregnant, when you are pregnant, or when you are breastfeeding. If you are having trouble stopping marijuana use, ask your health care provider for help.  Do not smoke. If you need help quitting, ask your health care provider for help.  If you are using medical marijuana, ask your health care provider to switch you to a medicine that is safer to use during pregnancy or breastfeeding.  Keep all your prenatal visits  as told by your health care provider. This is important. Where to find more information [employer] on Drug Abuse: www.drugabuse.gov March of Dimes: www.marchofdimes.org/pregnancy Contact a health care provider if:  You use marijuana and want to get pregnant.  You use marijuana during pregnancy or breastfeeding.  You need help stopping marijuana use. Get help right away if:  Your baby is not gaining weight or growing as expected.  Summary  Using marijuana in any form may be harmful for you and your baby when you are trying to become pregnant, during pregnancy, and during breastfeeding. This includes marijuana that is prescribed to you (medical marijuana).  Some studies suggest that marijuana may pass through breast milk and can affect your baby's brain development.  Talk to your health care provider if you use marijuana in any form while trying to get pregnant, during pregnancy, or while breastfeeding.  Ask your health care provider for help if you are not able to stop using marijuana. This information is not intended to replace advice given to you by your health care provider. Make sure you discuss any questions you have with your health care provider. Document Released: 06/03/2017 Document Revised: 01/07/2019 Document Reviewed: 06/03/2017 Elsevier Patient Education  2020 [employer].   Constipation, Adult Constipation is when a person:  Poops (has a bowel movement) fewer times in a week than normal.  Has a hard time pooping.  Has poop that is dry, hard, or bigger than normal. Follow these instructions at home: Eating and drinking   Eat foods that have a lot of fiber, such as: ? Fresh fruits and vegetables. ? Whole grains. ? Beans.  Eat less of foods that are high in fat, low in fiber, or overly processed, such as: ? [place] fries. ? Hamburgers. ? Cookies. ? Candy. ? Soda.  Drink enough fluid to keep your pee (urine) clear or pale yellow. General  instructions  Exercise regularly or as told by your doctor.  Go to the restroom when you feel like you need to poop. Do not hold it in.  Take over-the-counter and prescription medicines only as told by your doctor. These include any fiber supplements.  Do pelvic floor retraining exercises, such as: ? Doing deep breathing while relaxing your lower belly (abdomen). ? Relaxing your pelvic floor while pooping.  Watch your condition for any changes.  Keep all follow-up visits as told by your doctor. This is important. Contact a doctor if:  You have pain that gets worse.  You have a fever.  You have not pooped for 4 days.  You throw up (vomit).  You are not hungry.  You lose weight.  You are bleeding from the anus.  You have thin, pencil-like poop (stool). Get help right away if:  You have a fever, and your symptoms suddenly get worse.  You leak poop or have blood in your poop.  Your belly feels hard or bigger than normal (is bloated).  You have very bad belly pain.  You feel dizzy or you faint. This information is not intended to replace advice given to you by your health care provider. Make sure you discuss any questions you have with your health care provider. Document Released: 03/03/2008 Document Revised: 08/28/2017 Document Reviewed: 03/05/2016 Elsevier Patient Education  2020 [employer].

## 2019-09-02 ENCOUNTER — Telehealth: Payer: Self-pay | Admitting: Certified Nurse Midwife

## 2019-09-02 DIAGNOSIS — B951 Streptococcus, group B, as the cause of diseases classified elsewhere: Secondary | ICD-10-CM

## 2019-09-02 DIAGNOSIS — O2340 Unspecified infection of urinary tract in pregnancy, unspecified trimester: Secondary | ICD-10-CM | POA: Insufficient documentation

## 2019-09-02 DIAGNOSIS — O2342 Unspecified infection of urinary tract in pregnancy, second trimester: Secondary | ICD-10-CM

## 2019-09-02 LAB — CULTURE, OB URINE: Culture: 40000 — AB

## 2019-09-02 MED ORDER — CEFADROXIL 500 MG PO CAPS: 500.0000 mg | ORAL_CAPSULE | Freq: Two times a day (BID) | ORAL | 0 refills | Status: DC

## 2019-09-02 NOTE — Telephone Encounter (Signed)
Pt notified of GBS UTI. Rx for Duricef, pt has taken cephalosporins w/o problem in past.

## 2019-09-05 ENCOUNTER — Encounter: Payer: Self-pay | Admitting: Obstetrics & Gynecology

## 2019-09-05 ENCOUNTER — Other Ambulatory Visit: Payer: Self-pay

## 2019-09-05 ENCOUNTER — Ambulatory Visit (INDEPENDENT_AMBULATORY_CARE_PROVIDER_SITE_OTHER): Payer: Medicaid Other | Admitting: Obstetrics & Gynecology

## 2019-09-05 VITALS — BP 124/77 | HR 86 | Wt 151.0 lb

## 2019-09-05 DIAGNOSIS — O10912 Unspecified pre-existing hypertension complicating pregnancy, second trimester: Secondary | ICD-10-CM

## 2019-09-05 DIAGNOSIS — Z3A14 14 weeks gestation of pregnancy: Secondary | ICD-10-CM

## 2019-09-05 DIAGNOSIS — O10919 Unspecified pre-existing hypertension complicating pregnancy, unspecified trimester: Secondary | ICD-10-CM

## 2019-09-05 DIAGNOSIS — B951 Streptococcus, group B, as the cause of diseases classified elsewhere: Secondary | ICD-10-CM

## 2019-09-05 DIAGNOSIS — O0992 Supervision of high risk pregnancy, unspecified, second trimester: Secondary | ICD-10-CM

## 2019-09-05 DIAGNOSIS — O0990 Supervision of high risk pregnancy, unspecified, unspecified trimester: Secondary | ICD-10-CM

## 2019-09-05 DIAGNOSIS — O98812 Other maternal infectious and parasitic diseases complicating pregnancy, second trimester: Secondary | ICD-10-CM

## 2019-09-05 DIAGNOSIS — O2342 Unspecified infection of urinary tract in pregnancy, second trimester: Secondary | ICD-10-CM

## 2019-09-05 MED ORDER — ASPIRIN EC 81 MG PO TBEC: 81.0000 mg | DELAYED_RELEASE_TABLET | Freq: Every day | ORAL | 2 refills | Status: DC

## 2019-09-05 MED ORDER — BLOOD PRESSURE KIT DEVI: 1.0000 | Freq: Every day | 0 refills | Status: DC

## 2019-09-05 NOTE — Progress Notes (Signed)
  Subjective:    Janice Duarte is a G1P0000 [redacted]w[redacted]d being seen today for her first obstetrical visit.  Her obstetrical history is significant for group B strep colonizer and UTI. Patient does intend to breast feed. Pregnancy history fully reviewed.  Patient reports no complaints.  Vitals:   09/05/19 1337  BP: 124/77  Pulse: 86  Weight: 151 lb (68.5 kg)    HISTORY: OB History  Gravida Para Term Preterm AB Living  1 0 0 0 0 0  SAB TAB Ectopic Multiple Live Births  0 0 0 0 0    # Outcome Date GA Lbr Len/2nd Weight Sex Delivery Anes PTL Lv  1 Current            Past Medical History:  Diagnosis Date  . Abscess   . Bacterial vaginal infection   . Headaches, cluster   . Hypertension   . Scoliosis   . Scoliosis    Past Surgical History:  Procedure Laterality Date  . WISDOM TOOTH EXTRACTION     Family History  Problem Relation Age of Onset  . Diabetes Other   . Hypertension Other   . Hypertension Mother   . Heart failure Mother   . Hepatitis B Mother   . Cirrhosis Mother   . Hypertension Paternal Aunt      Exam    Uterus:     Pelvic Exam:    Perineum: No Hemorrhoids   Vulva: normal   Vagina:  normal mucosa   pH:    Cervix: no lesions   Adnexa: normal adnexa   Bony Pelvis: average  System: Breast:  normal appearance, no masses or tenderness   Skin: normal coloration and turgor, no rashes    Neurologic: oriented, normal mood   Extremities: normal strength, tone, and muscle mass   HEENT neck supple with midline trachea and thyroid without masses   Mouth/Teeth mucous membranes moist, pharynx normal without lesions and dental hygiene good   Neck supple   Cardiovascular: regular rate and rhythm, no murmurs or gallops   Respiratory:  appears well, vitals normal, no respiratory distress, acyanotic, normal RR, neck free of mass or lymphadenopathy, chest clear, no wheezing, crepitations, rhonchi, normal symmetric air entry   Abdomen: soft, non-tender; bowel  sounds normal; no masses,  no organomegaly   Urinary: urethral meatus normal      Assessment:    Pregnancy: G1P0000 Patient Active Problem List   Diagnosis Date Noted  . GBS (group B streptococcus) UTI complicating pregnancy 02/58/5277  . Supervision of high-risk pregnancy 08/29/2019  . E. coli UTI (urinary tract infection) 08/07/2019  . Intrauterine pregnancy 08/03/2019  . Chronic hypertension affecting pregnancy 08/03/2019  . Spotting in early pregnancy 08/03/2019        Plan:     Initial labs drawn. Prenatal vitamins. Problem list reviewed and updated. Genetic Screening discussed [facility] and [facility]   Ultrasound discussed; fetal survey: ordered.  Follow up in 4 weeks. 50% of 30 min visit spent on counseling and coordination of care.  ASA 81 mg /day   Emeterio Reeve 09/05/2019

## 2019-09-05 NOTE — Patient Instructions (Signed)

## 2019-09-06 ENCOUNTER — Other Ambulatory Visit: Payer: Self-pay

## 2019-09-06 LAB — OBSTETRIC PANEL, INCLUDING HIV
Antibody Screen: NEGATIVE
Basophils Absolute: 0 10*3/uL (ref 0.0–0.2)
Basos: 0 %
EOS (ABSOLUTE): 0.1 10*3/uL (ref 0.0–0.4)
Eos: 0 %
HIV Screen 4th Generation wRfx: NONREACTIVE
Hematocrit: 37 % (ref 34.0–46.6)
Hemoglobin: 12.1 g/dL (ref 11.1–15.9)
Hepatitis B Surface Ag: NEGATIVE
Immature Grans (Abs): 0 10*3/uL (ref 0.0–0.1)
Immature Granulocytes: 0 %
Lymphocytes Absolute: 3.2 10*3/uL — ABNORMAL HIGH (ref 0.7–3.1)
Lymphs: 19 %
MCH: 24.6 pg — ABNORMAL LOW (ref 26.6–33.0)
MCHC: 32.7 g/dL (ref 31.5–35.7)
MCV: 75 fL — ABNORMAL LOW (ref 79–97)
Monocytes Absolute: 0.9 10*3/uL (ref 0.1–0.9)
Monocytes: 6 %
Neutrophils Absolute: 12.6 10*3/uL — ABNORMAL HIGH (ref 1.4–7.0)
Neutrophils: 75 %
Platelets: 377 10*3/uL (ref 150–450)
RBC: 4.91 x10E6/uL (ref 3.77–5.28)
RDW: 18.4 % — ABNORMAL HIGH (ref 11.7–15.4)
RPR Ser Ql: NONREACTIVE
Rh Factor: POSITIVE
Rubella Antibodies, IGG: 1.87 index (ref >0.99)
WBC: 16.8 10*3/uL — ABNORMAL HIGH (ref 3.4–10.8)

## 2019-09-07 LAB — CULTURE, OB URINE

## 2019-09-07 LAB — URINE CULTURE, OB REFLEX

## 2019-09-12 ENCOUNTER — Encounter: Payer: Self-pay | Admitting: General Practice

## 2019-09-19 ENCOUNTER — Other Ambulatory Visit: Payer: Self-pay | Admitting: Lactation Services

## 2019-09-19 MED ORDER — BLOOD PRESSURE KIT DEVI: 1.0000 | Freq: Every day | 0 refills | Status: DC

## 2019-09-19 NOTE — Progress Notes (Signed)
Sent BP cuff order to [facility] per standing protocol

## 2019-09-20 ENCOUNTER — Telehealth (INDEPENDENT_AMBULATORY_CARE_PROVIDER_SITE_OTHER): Payer: Medicaid Other | Admitting: Lactation Services

## 2019-09-20 DIAGNOSIS — O0990 Supervision of high risk pregnancy, unspecified, unspecified trimester: Secondary | ICD-10-CM

## 2019-09-20 NOTE — Telephone Encounter (Signed)
Called pt with results from [employer].   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 Pt reports Johnsie Cancel has called and she has a Metallurgist appointment on 09/28/2019.   Pt was informed these results apply to the mother and may or may not effect the baby. Informed pt that Johnsie Cancel may request testing for father of the baby.   Pt with no questions and will call with questions as needed.

## 2019-09-21 ENCOUNTER — Encounter: Payer: Self-pay | Admitting: General Practice

## 2019-09-21 ENCOUNTER — Telehealth: Payer: Self-pay | Admitting: Obstetrics and Gynecology

## 2019-09-21 ENCOUNTER — Ambulatory Visit: Payer: Medicaid Other

## 2019-09-21 DIAGNOSIS — B379 Candidiasis, unspecified: Secondary | ICD-10-CM

## 2019-09-21 NOTE — Telephone Encounter (Signed)
The patient stated she does not have a ride until this afternooon. Informed the patient we don't have the option to move the appointment at this time, would she like to schedule for a different day? She stated no and if her blood pressure goes up she will just go to the ER. She also stated [facility] has not given her a bp cuff and that's the issue with her having to come in. She does not have one. Informed the patient of notifying the nurse staff regarding the bp cuff.

## 2019-09-27 ENCOUNTER — Telehealth: Payer: Self-pay | Admitting: Family Medicine

## 2019-09-27 MED ORDER — TERCONAZOLE 0.4 % VA CREA: 1.0000 | TOPICAL_CREAM | Freq: Every day | VAGINAL | 0 refills | Status: DC

## 2019-09-27 NOTE — Telephone Encounter (Signed)
Attempted to reach patient about canceling her appointment for a self swab. We had to make changes in the office, and her next appointment is 01/04.

## 2019-09-28 ENCOUNTER — Ambulatory Visit: Payer: Medicaid Other

## 2019-09-30 NOTE — L&D Delivery Note (Signed)
OB/GYN Faculty Practice Delivery Note  Janice Duarte is a 29 y.o. G1P0000 s/p VD at [redacted]w[redacted]d. She was admitted for IOL for worsening cHTN.   ROM: 11h 69m with clear fluid GBS Status: Positive/-- (12/03 0000) Maximum Maternal Temperature: 98.41F  Labor Progress: . Initial SVE: 0.5/thick/high. Patient received Cytotec, Foley balloon, Pitocin and AROM. Received epidural. Protracted latent phase. She then progressed to complete.   Delivery Date/Time: 5/25 @ 0254 Delivery: Called to room and patient was complete and pushing. Head delivered in LOA position. No nuchal cord present. Shoulder and body delivered in usual fashion. Infant with spontaneous cry, placed on mother's abdomen, dried and stimulated. Cord clamped x 2 after 1-minute delay, and cut by support person. Cord blood drawn. Placenta delivered spontaneously with gentle cord traction. Fundus firm with massage and Pitocin. Labia, perineum, vagina, and cervix inspected inspected with no lacerations.  Baby Weight: pending  Placenta: Sent to L&D Complications: None Lacerations: None EBL: 82 mL Analgesia: Epidural   Infant:  APGAR (1 MIN): 9   APGAR (5 MINS): 9  APGAR (10 MINS):     Jerilynn Birkenhead, MD OB Family Medicine Fellow, [facility] for [facility], [facility] 02/21/2020, 3:08 AM

## 2019-10-03 ENCOUNTER — Ambulatory Visit (INDEPENDENT_AMBULATORY_CARE_PROVIDER_SITE_OTHER): Payer: Medicaid Other | Admitting: Family Medicine

## 2019-10-03 ENCOUNTER — Other Ambulatory Visit: Payer: Self-pay

## 2019-10-03 ENCOUNTER — Encounter: Payer: Self-pay | Admitting: Family Medicine

## 2019-10-03 VITALS — BP 122/70 | HR 92 | Wt 163.0 lb

## 2019-10-03 DIAGNOSIS — O10912 Unspecified pre-existing hypertension complicating pregnancy, second trimester: Secondary | ICD-10-CM

## 2019-10-03 DIAGNOSIS — O2342 Unspecified infection of urinary tract in pregnancy, second trimester: Secondary | ICD-10-CM

## 2019-10-03 DIAGNOSIS — O0992 Supervision of high risk pregnancy, unspecified, second trimester: Secondary | ICD-10-CM

## 2019-10-03 DIAGNOSIS — Z3A18 18 weeks gestation of pregnancy: Secondary | ICD-10-CM

## 2019-10-03 DIAGNOSIS — O10919 Unspecified pre-existing hypertension complicating pregnancy, unspecified trimester: Secondary | ICD-10-CM

## 2019-10-03 DIAGNOSIS — B951 Streptococcus, group B, as the cause of diseases classified elsewhere: Secondary | ICD-10-CM

## 2019-10-03 MED ORDER — CYCLOBENZAPRINE HCL 10 MG PO TABS: 5.0000 mg | ORAL_TABLET | Freq: Three times a day (TID) | ORAL | 2 refills | Status: DC | PRN

## 2019-10-03 NOTE — Progress Notes (Signed)
Pt reports she feels everything is going well. She has been experiencing pain in the ovary area after voiding. It has been occurring for the last week. It is a sharp stabbing pain that lasts until urination is complete.

## 2019-10-03 NOTE — Progress Notes (Signed)
   PRENATAL VISIT NOTE  Subjective:  Janice Duarte is a 29 y.o. G1P0000 at [redacted]w[redacted]d being seen today for ongoing prenatal care.  She is currently monitored for the following issues for this high-risk pregnancy and has Chronic hypertension affecting pregnancy; E. coli UTI (urinary tract infection); Supervision of high-risk pregnancy; and GBS (group B streptococcus) UTI complicating pregnancy on their problem list.  Patient reports sciatic pain. Pain with urination.  Contractions: Not present. Vag. Bleeding: None.  Movement: Absent. Denies leaking of fluid.   The following portions of the patient's history were reviewed and updated as appropriate: allergies, current medications, past family history, past medical history, past social history, past surgical history and problem list.   Objective:   Vitals:   10/03/19 1619  BP: 122/70  Pulse: 92  Weight: 163 lb (73.9 kg)    Fetal Status: Fetal Heart Rate (bpm): 156 Fundal Height: 18 cm Movement: Absent     General:  Alert, oriented and cooperative. Patient is in no acute distress.  Skin: Skin is warm and dry. No rash noted.   Cardiovascular: Normal heart rate noted  Respiratory: Normal respiratory effort, no problems with respiration noted  Abdomen: Soft, gravid, appropriate for gestational age.  Pain/Pressure: Absent     Pelvic: Cervical exam deferred        Extremities: Normal range of motion.  Edema: None  Mental Status: Normal mood and affect. Normal behavior. Normal judgment and thought content.   Assessment and Plan:  Pregnancy: G1P0000 at [redacted]w[redacted]d 1. Supervision of high risk pregnancy in second trimester FHT and FH normal. UA negative  2. Group B Streptococcus urinary tract infection affecting pregnancy in second trimester Intrapartum PPX  3. Chronic hypertension affecting pregnancy BP normal today Serial [place]  Preterm labor symptoms and general obstetric precautions including but not limited to vaginal bleeding, contractions,  leaking of fluid and fetal movement were reviewed in detail with the patient. Please refer to After Visit Summary for other counseling recommendations.   Return in about 4 weeks (around 10/31/2019) for HR OB f/u, Virtual.  Future Appointments  Date Time Provider Department Center  10/13/2019  8:00 AM [facility] NURSE [facility]  10/13/2019  8:00 AM [facility] [place] 3 [facility]    Levie Heritage, DO

## 2019-10-04 LAB — POCT URINALYSIS DIP (DEVICE)
Bilirubin Urine: NEGATIVE
Glucose, UA: NEGATIVE mg/dL
Hgb urine dipstick: NEGATIVE
Ketones, ur: NEGATIVE mg/dL
Leukocytes,Ua: NEGATIVE
Nitrite: NEGATIVE
Protein, ur: NEGATIVE mg/dL
Specific Gravity, Urine: 1.02 (ref 1.005–1.030)
Urobilinogen, UA: 0.2 mg/dL (ref 0.0–1.0)
pH: 5.5 (ref 5.0–8.0)

## 2019-10-13 ENCOUNTER — Other Ambulatory Visit: Payer: Self-pay

## 2019-10-13 ENCOUNTER — Ambulatory Visit (HOSPITAL_COMMUNITY): Payer: Medicaid Other | Admitting: *Deleted

## 2019-10-13 ENCOUNTER — Other Ambulatory Visit (HOSPITAL_COMMUNITY): Payer: Self-pay | Admitting: *Deleted

## 2019-10-13 ENCOUNTER — Encounter (HOSPITAL_COMMUNITY): Payer: Self-pay

## 2019-10-13 ENCOUNTER — Ambulatory Visit (HOSPITAL_COMMUNITY)
Admission: RE | Admit: 2019-10-13 | Discharge: 2019-10-13 | Disposition: A | Discharge status: Home / Self Care | Payer: Medicaid Other | Source: Ambulatory Visit | Attending: Obstetrics and Gynecology | Admitting: Obstetrics and Gynecology

## 2019-10-13 DIAGNOSIS — O10919 Unspecified pre-existing hypertension complicating pregnancy, unspecified trimester: Secondary | ICD-10-CM | POA: Diagnosis present

## 2019-10-13 DIAGNOSIS — O10012 Pre-existing essential hypertension complicating pregnancy, second trimester: Secondary | ICD-10-CM

## 2019-10-13 DIAGNOSIS — O0992 Supervision of high risk pregnancy, unspecified, second trimester: Secondary | ICD-10-CM | POA: Diagnosis present

## 2019-10-13 DIAGNOSIS — O0990 Supervision of high risk pregnancy, unspecified, unspecified trimester: Secondary | ICD-10-CM | POA: Insufficient documentation

## 2019-10-13 DIAGNOSIS — O10912 Unspecified pre-existing hypertension complicating pregnancy, second trimester: Secondary | ICD-10-CM

## 2019-10-13 DIAGNOSIS — Z3A20 20 weeks gestation of pregnancy: Secondary | ICD-10-CM

## 2019-10-23 ENCOUNTER — Other Ambulatory Visit: Payer: Self-pay

## 2019-10-23 ENCOUNTER — Inpatient Hospital Stay (HOSPITAL_COMMUNITY): Payer: Medicaid Other

## 2019-10-23 ENCOUNTER — Encounter (HOSPITAL_COMMUNITY): Payer: Self-pay | Admitting: Obstetrics & Gynecology

## 2019-10-23 ENCOUNTER — Inpatient Hospital Stay (HOSPITAL_COMMUNITY)
Admission: AD | Admit: 2019-10-23 | Discharge: 2019-10-23 | Disposition: A | Discharge status: Home / Self Care | Payer: Medicaid Other | Attending: Obstetrics & Gynecology | Admitting: Obstetrics & Gynecology

## 2019-10-23 ENCOUNTER — Inpatient Hospital Stay (HOSPITAL_BASED_OUTPATIENT_CLINIC_OR_DEPARTMENT_OTHER): Payer: Medicaid Other

## 2019-10-23 ENCOUNTER — Other Ambulatory Visit: Payer: Self-pay | Admitting: Advanced Practice Midwife

## 2019-10-23 DIAGNOSIS — O91112 Abscess of breast associated with pregnancy, second trimester: Secondary | ICD-10-CM | POA: Diagnosis not present

## 2019-10-23 DIAGNOSIS — Z880 Allergy status to penicillin: Secondary | ICD-10-CM | POA: Diagnosis not present

## 2019-10-23 DIAGNOSIS — I10 Essential (primary) hypertension: Secondary | ICD-10-CM

## 2019-10-23 DIAGNOSIS — Z3A21 21 weeks gestation of pregnancy: Secondary | ICD-10-CM | POA: Diagnosis not present

## 2019-10-23 DIAGNOSIS — R1031 Right lower quadrant pain: Secondary | ICD-10-CM | POA: Diagnosis not present

## 2019-10-23 DIAGNOSIS — M545 Low back pain: Secondary | ICD-10-CM | POA: Diagnosis not present

## 2019-10-23 DIAGNOSIS — L732 Hidradenitis suppurativa: Secondary | ICD-10-CM

## 2019-10-23 DIAGNOSIS — M419 Scoliosis, unspecified: Secondary | ICD-10-CM | POA: Diagnosis not present

## 2019-10-23 DIAGNOSIS — O26892 Other specified pregnancy related conditions, second trimester: Secondary | ICD-10-CM | POA: Diagnosis not present

## 2019-10-23 DIAGNOSIS — R109 Unspecified abdominal pain: Secondary | ICD-10-CM

## 2019-10-23 DIAGNOSIS — O10012 Pre-existing essential hypertension complicating pregnancy, second trimester: Secondary | ICD-10-CM | POA: Diagnosis not present

## 2019-10-23 DIAGNOSIS — D72829 Elevated white blood cell count, unspecified: Secondary | ICD-10-CM | POA: Insufficient documentation

## 2019-10-23 HISTORY — DX: Hidradenitis suppurativa: L73.2

## 2019-10-23 LAB — CBC WITH DIFFERENTIAL/PLATELET
Abs Immature Granulocytes: 0.1 10*3/uL — ABNORMAL HIGH (ref 0.00–0.07)
Basophils Absolute: 0 10*3/uL (ref 0.0–0.1)
Basophils Relative: 0 %
Eosinophils Absolute: 0.1 10*3/uL (ref 0.0–0.5)
Eosinophils Relative: 0 %
HCT: 32.2 % — ABNORMAL LOW (ref 36.0–46.0)
Hemoglobin: 10.8 g/dL — ABNORMAL LOW (ref 12.0–15.0)
Immature Granulocytes: 1 %
Lymphocytes Relative: 15 %
Lymphs Abs: 2.5 10*3/uL (ref 0.7–4.0)
MCH: 26.1 pg (ref 26.0–34.0)
MCHC: 33.5 g/dL (ref 30.0–36.0)
MCV: 77.8 fL — ABNORMAL LOW (ref 80.0–100.0)
Monocytes Absolute: 1.1 10*3/uL — ABNORMAL HIGH (ref 0.1–1.0)
Monocytes Relative: 6 %
Neutro Abs: 12.9 10*3/uL — ABNORMAL HIGH (ref 1.7–7.7)
Neutrophils Relative %: 78 %
Platelets: 356 10*3/uL (ref 150–400)
RBC: 4.14 MIL/uL (ref 3.87–5.11)
RDW: 14.6 % (ref 11.5–15.5)
WBC: 16.7 10*3/uL — ABNORMAL HIGH (ref 4.0–10.5)
nRBC: 0 % (ref 0.0–0.2)

## 2019-10-23 LAB — URINALYSIS, ROUTINE W REFLEX MICROSCOPIC
Bilirubin Urine: NEGATIVE
Glucose, UA: NEGATIVE mg/dL
Hgb urine dipstick: NEGATIVE
Ketones, ur: NEGATIVE mg/dL
Leukocytes,Ua: NEGATIVE
Nitrite: NEGATIVE
Protein, ur: NEGATIVE mg/dL
Specific Gravity, Urine: 1.011 (ref 1.005–1.030)
pH: 6 (ref 5.0–8.0)

## 2019-10-23 MED ORDER — CEPHALEXIN 500 MG PO CAPS: 500.0000 mg | ORAL_CAPSULE | Freq: Four times a day (QID) | ORAL | 0 refills | Status: AC

## 2019-10-23 MED ORDER — TRAMADOL HCL 50 MG PO TABS: 50.0000 mg | ORAL_TABLET | Freq: Four times a day (QID) | ORAL | 0 refills | Status: DC | PRN

## 2019-10-23 MED ORDER — IBUPROFEN 600 MG PO TABS
600.0000 mg | ORAL_TABLET | Freq: Once | ORAL | Status: AC
  Administered 2019-10-23: 600 mg via ORAL
  Filled 2019-10-23: qty 1

## 2019-10-23 MED ORDER — CEPHALEXIN 500 MG PO CAPS
500.0000 mg | ORAL_CAPSULE | Freq: Once | ORAL | Status: AC
  Administered 2019-10-23: 500 mg via ORAL
  Filled 2019-10-23: qty 1

## 2019-10-23 MED ORDER — ONDANSETRON HCL 4 MG/2ML IJ SOLN
4.0000 mg | Freq: Once | INTRAMUSCULAR | Status: AC
  Administered 2019-10-23: 4 mg via INTRAVENOUS
  Filled 2019-10-23: qty 2

## 2019-10-23 MED ORDER — TRAMADOL HCL 50 MG PO TABS: 100.0000 mg | ORAL_TABLET | Freq: Once | ORAL | Status: DC | PRN

## 2019-10-23 MED ORDER — FENTANYL CITRATE (PF) 100 MCG/2ML IJ SOLN: 100.0000 ug | INTRAMUSCULAR | Status: DC | PRN

## 2019-10-23 MED ORDER — LACTATED RINGERS IV SOLN: INTRAVENOUS | Status: DC

## 2019-10-23 NOTE — MAU Note (Signed)
Patient presents to MAU c/o pressure in lower back, right ovary pain, and abscess under right breast.  Back pain and ovary pain for 2 days and breast abscess "just came up and is getting bigger and bigger"

## 2019-10-23 NOTE — Discharge Instructions (Signed)
Abdominal Pain During Pregnancy  Abdominal pain is common during pregnancy, and has many possible causes. Some causes are more serious than others, and sometimes the cause is not known. Abdominal pain can be a sign that labor is starting. It can also be caused by normal growth and stretching of muscles and ligaments during pregnancy. Always tell your health care provider if you have any abdominal pain. Follow these instructions at home:  Do not have sex or put anything in your vagina until your pain goes away completely.  Get plenty of rest until your pain improves.  Drink enough fluid to keep your urine pale yellow.  Take over-the-counter and prescription medicines only as told by your health care provider.  Keep all follow-up visits as told by your health care provider. This is important. Contact a health care provider if:  Your pain continues or gets worse after resting.  You have lower abdominal pain that: ? Comes and goes at regular intervals. ? Spreads to your back. ? Is similar to menstrual cramps.  You have pain or burning when you urinate. Get help right away if:  You have a fever or chills.  You have vaginal bleeding.  You are leaking fluid from your vagina.  You are passing tissue from your vagina.  You have vomiting or diarrhea that lasts for more than 24 hours.  Your baby is moving less than usual.  You feel very weak or faint.  You have shortness of breath.  You develop severe pain in your upper abdomen. Summary  Abdominal pain is common during pregnancy, and has many possible causes.  If you experience abdominal pain during pregnancy, tell your health care provider right away.  Follow your health care provider's home care instructions and keep all follow-up visits as directed. This information is not intended to replace advice given to you by your health care provider. Make sure you discuss any questions you have with your health care  provider. Document Revised: 01/03/2019 Document Reviewed: 12/18/2016 Elsevier Patient Education  2020 Elsevier Inc.   Hidradenitis Suppurativa Hidradenitis suppurativa is a long-term (chronic) skin disease. It is similar to a severe form of acne, but it affects areas of the body where acne would be unusual, especially areas of the body where skin rubs against skin and becomes moist. These include:  Underarms.  Groin.  Genital area.  Buttocks.  Upper thighs.  Breasts. Hidradenitis suppurativa may start out as small lumps or pimples caused by blocked sweat glands or hair follicles. Pimples may develop into deep sores that break open (rupture) and drain pus. Over time, affected areas of skin may thicken and become scarred. This condition is rare and does not spread from person to person (non-contagious). What are the causes? The exact cause of this condition is not known. It may be related to:  Female and female hormones.  An overactive disease-fighting system (immune system). The immune system may over-react to blocked hair follicles or sweat glands and cause swelling and pus-filled sores. What increases the risk? You are more likely to develop this condition if you:  Are female.  Are 63-20 years old.  Have a family history of hidradenitis suppurativa.  Have a personal history of acne.  Are overweight.  Smoke.  Take the medicine lithium. What are the signs or symptoms? The first symptoms are usually painful bumps in the skin, similar to pimples. The condition may get worse over time (progress), or it may only cause mild symptoms. If the disease progresses,  symptoms may include:  Skin bumps getting bigger and growing deeper into the skin.  Bumps rupturing and draining pus.  Itchy, infected skin.  Skin getting thicker and scarred.  Tunnels under the skin (fistulas) where pus drains from a bump.  Pain during daily activities, such as pain during walking if your groin  area is affected.  Emotional problems, such as stress or depression. This condition may affect your appearance and your ability or willingness to wear certain clothes or do certain activities. How is this diagnosed? This condition is diagnosed by a health care provider who specializes in skin diseases (dermatologist). You may be diagnosed based on:  Your symptoms and medical history.  A physical exam.  Testing a pus sample for infection.  Blood tests. How is this treated? Your treatment will depend on how severe your symptoms are. The same treatment will not work for everybody with this condition. You may need to try several treatments to find what works best for you. Treatment may include:  Cleaning and bandaging (dressing) your wounds as needed.  Lifestyle changes, such as new skin care routines.  Taking medicines, such as: ? Antibiotics. ? Acne medicines. ? Medicines to reduce the activity of the immune system. ? A diabetes medicine (metformin). ? Birth control pills, for women. ? Steroids to reduce swelling and pain.  Working with a mental health care provider, if you experience emotional distress due to this condition. If you have severe symptoms that do not get better with medicine, you may need surgery. Surgery may involve:  Using a laser to clear the skin and remove hair follicles.  Opening and draining deep sores.  Removing the areas of skin that are diseased and scarred. Follow these instructions at home: Medicines   Take over-the-counter and prescription medicines only as told by your health care provider.  If you were prescribed an antibiotic medicine, take it as told by your health care provider. Do not stop taking the antibiotic even if your condition improves. Skin care  If you have open wounds, cover them with a clean dressing as told by your health care provider. Keep wounds clean by washing them gently with soap and water when you bathe.  Do not shave  the areas where you get hidradenitis suppurativa.  Do not wear deodorant.  Wear loose-fitting clothes.  Try to avoid getting overheated or sweaty. If you get sweaty or wet, change into clean, dry clothes as soon as you can.  To help relieve pain and itchiness, cover sore areas with a warm, clean washcloth (warm compress) for 5-10 minutes as often as needed.  If told by your health care provider, take a bleach bath twice a week: ? Fill your bathtub halfway with water. ? Pour in  cup of unscented household bleach. ? Soak in the tub for 5-10 minutes. ? Only soak from the neck down. Avoid water on your face and hair. ? Shower to rinse off the bleach from your skin. General instructions  Learn as much as you can about your disease so that you have an active role in your treatment. Work closely with your health care provider to find treatments that work for you.  If you are overweight, work with your health care provider to lose weight as recommended.  Do not use any products that contain nicotine or tobacco, such as cigarettes and e-cigarettes. If you need help quitting, ask your health care provider.  If you struggle with living with this condition, talk with your  health care provider or work with a mental health care provider as recommended.  Keep all follow-up visits as told by your health care provider. This is important. Where to find more information  Hidradenitis [place].: [email or web address]://www.hs-foundation.org/ Contact a health care provider if you have:  A flare-up of hidradenitis suppurativa.  A fever or chills.  Trouble controlling your symptoms at home.  Trouble doing your daily activities because of your symptoms.  Trouble dealing with emotional problems related to your condition. Summary  Hidradenitis suppurativa is a long-term (chronic) skin disease. It is similar to a severe form of acne, but it affects areas of the body where acne would be  unusual.  The first symptoms are usually painful bumps in the skin, similar to pimples. The condition may get worse over time (progress), or it may only cause mild symptoms.  If you have open wounds, cover them with a clean dressing as told by your health care provider. Keep wounds clean by washing them gently with soap and water when you bathe.  Besides skin care, treatment may include medicines, laser treatment, and surgery. This information is not intended to replace advice given to you by your health care provider. Make sure you discuss any questions you have with your health care provider. Document Revised: 09/23/2017 Document Reviewed: 09/23/2017 Elsevier Patient Education  2020 [employer].

## 2019-10-23 NOTE — MAU Provider Note (Addendum)
History     CSN: [id number]  Arrival date and time: 10/23/19 0608   First Provider Initiated Contact with Patient 10/23/19 0737      Chief Complaint  Patient presents with  . Back Pain  . ovary pain  . Abscess    under right breast   HPI   Ms.Janice Duarte is a 29 y.o. female G1P0000 @ 5w3dhere with complaints of abscess under her right breast. This is a recurring problem. The abscess presented Friday, she states it is getting worse. The abscess is painful and tender. She has not taken any medication for the abscess.. She also attests to lower back pain that has been constant with this pregnancy. She has a history of scoliosis and has dealt with back pain prior to pregnancy. The back pain radiates around to her right ovary. The right sided pain hurts worse with movement. She denies N/V or fever. She has not taken any medication for the ovary or back pain. No bleeding.   OB History    Gravida  1   Para  0   Term  0   Preterm  0   AB  0   Living  0     SAB  0   TAB  0   Ectopic  0   Multiple  0   Live Births  0           Past Medical History:  Diagnosis Date  . Abscess   . Bacterial vaginal infection   . Headaches, cluster   . Hypertension   . Scoliosis   . Scoliosis     Past Surgical History:  Procedure Laterality Date  . WISDOM TOOTH EXTRACTION      Family History  Problem Relation Age of Onset  . Diabetes Other   . Hypertension Other   . Hypertension Mother   . Heart failure Mother   . Hepatitis B Mother   . Cirrhosis Mother   . Hypertension Paternal Aunt     Social History   Tobacco Use  . Smoking status: Never Smoker  . Smokeless tobacco: Never Used  Substance Use Topics  . Alcohol use: Not Currently    Comment: socially  . Drug use: Yes    Frequency: 7.0 times per week    Types: Marijuana    Comment: once a day    Allergies:  Allergies  Allergen Reactions  . Penicillins Hives    Has patient had a PCN reaction  causing immediate rash, facial/tongue/throat swelling, SOB or lightheadedness with hypotension: Yes Has patient had a PCN reaction causing severe rash involving mucus membranes or skin necrosis: Yes Has patient had a PCN reaction that required hospitalization No Has patient had a PCN reaction occurring within the last 10 years: Yes  If all of the above answers are "NO", then may proceed with Cephalosporin use.   .Marland KitchenMorphine And Related Rash  . Bactrim [Sulfamethoxazole-Trimethoprim] Hives    Medications Prior to Admission  Medication Sig Dispense Refill Last Dose  . acetaminophen (TYLENOL) 500 MG tablet Take 500 mg by mouth every 6 (six) hours as needed for mild pain.     .Marland Kitchenaspirin EC 81 MG tablet Take 1 tablet (81 mg total) by mouth daily. 100 tablet 2   . Blood Pressure Monitoring (BLOOD PRESSURE KIT) DEVI 1 Device by Does not apply route daily. ICD 10: Z34.00 1 each 0   . cyclobenzaprine (FLEXERIL) 10 MG tablet Take 0.5-1 tablets (5-10 mg total)  by mouth 3 (three) times daily as needed for muscle spasms. (Patient not taking: Reported on 10/13/2019) 30 tablet 2   . ondansetron (ZOFRAN) 4 MG tablet Take by mouth.     . Prenatal MV-Min-FA-Omega-3 (PRENATAL GUMMIES/DHA & FA PO) Take 2 tablets by mouth daily.      Results for orders placed or performed during the hospital encounter of 10/23/19 (from the past 48 hour(s))  Urinalysis, Routine w reflex microscopic     Status: Abnormal   Collection Time: 10/23/19  7:15 AM  Result Value Ref Range   Color, Urine YELLOW YELLOW   APPearance HAZY (A) CLEAR   Specific Gravity, Urine 1.011 1.005 - 1.030   pH 6.0 5.0 - 8.0   Glucose, UA NEGATIVE NEGATIVE mg/dL   Hgb urine dipstick NEGATIVE NEGATIVE   Bilirubin Urine NEGATIVE NEGATIVE   Ketones, ur NEGATIVE NEGATIVE mg/dL   Protein, ur NEGATIVE NEGATIVE mg/dL   Nitrite NEGATIVE NEGATIVE   Leukocytes,Ua NEGATIVE NEGATIVE    Comment: Performed at [facility] [address], [place],  [place] [address]   Review of Systems  Constitutional: Negative for fever.  Gastrointestinal: Positive for abdominal pain. Negative for constipation, diarrhea, nausea and rectal pain.  Genitourinary: Negative for flank pain, vaginal bleeding and vaginal discharge.  Musculoskeletal: Positive for back pain.   Physical Exam   Blood pressure 116/68, pulse 97, resp. rate 20, height '5\' 5"'$  (1.651 m), weight 75.2 kg, last menstrual period 05/26/2019, SpO2 100 %, unknown if currently breastfeeding.  Physical Exam  Constitutional: She is oriented to person, place, and time. She appears well-nourished. No distress.  HENT:  Head: Normocephalic.  Respiratory: Effort normal.    1/2 dollar size fluctuant abscess at 4 o'clock if right breast. + surrounding tissue changes, no erythema.   GI: Soft. Normal appearance. There is abdominal tenderness in the right lower quadrant and suprapubic area. There is rebound and guarding. There is no rigidity.  Genitourinary:    Genitourinary Comments: Cervix: closed, thick, anterior.    Musculoskeletal:        General: Normal range of motion.     Cervical back: Neck supple.  Neurological: She is alert and oriented to person, place, and time.  Skin: Skin is warm. She is not diaphoretic.  Psychiatric: Her behavior is normal.     Results for orders placed or performed during the hospital encounter of 10/23/19 (from the past 24 hour(s))  Urinalysis, Routine w reflex microscopic     Status: Abnormal   Collection Time: 10/23/19  7:15 AM  Result Value Ref Range   Color, Urine YELLOW YELLOW   APPearance HAZY (A) CLEAR   Specific Gravity, Urine 1.011 1.005 - 1.030   pH 6.0 5.0 - 8.0   Glucose, UA NEGATIVE NEGATIVE mg/dL   Hgb urine dipstick NEGATIVE NEGATIVE   Bilirubin Urine NEGATIVE NEGATIVE   Ketones, ur NEGATIVE NEGATIVE mg/dL   Protein, ur NEGATIVE NEGATIVE mg/dL   Nitrite NEGATIVE NEGATIVE   Leukocytes,Ua NEGATIVE NEGATIVE  CBC with Differential/Platelet      Status: Abnormal   Collection Time: 10/23/19  8:03 AM  Result Value Ref Range   WBC 16.7 (H) 4.0 - 10.5 K/uL   RBC 4.14 3.87 - 5.11 MIL/uL   Hemoglobin 10.8 (L) 12.0 - 15.0 g/dL   HCT 32.2 (L) 36.0 - 46.0 %   MCV 77.8 (L) 80.0 - 100.0 fL   MCH 26.1 26.0 - 34.0 pg   MCHC 33.5 30.0 - 36.0 g/dL  RDW 14.6 11.5 - 15.5 %   Platelets 356 150 - 400 K/uL   nRBC 0.0 0.0 - 0.2 %   Neutrophils Relative % 78 %   Neutro Abs 12.9 (H) 1.7 - 7.7 K/uL   Lymphocytes Relative 15 %   Lymphs Abs 2.5 0.7 - 4.0 K/uL   Monocytes Relative 6 %   Monocytes Absolute 1.1 (H) 0.1 - 1.0 K/uL   Eosinophils Relative 0 %   Eosinophils Absolute 0.1 0.0 - 0.5 K/uL   Basophils Relative 0 %   Basophils Absolute 0.0 0.0 - 0.1 K/uL   Immature Granulocytes 1 %   Abs Immature Granulocytes 0.10 (H) 0.00 - 0.07 K/uL     MAU Course   Orders Placed This Encounter  Procedures  . [place] MFM OB LIMITED  . MR Abdomen W or Wo Contrast  . MR PELVIS WO CONTRAST  . Urinalysis, Routine w reflex microscopic  . CBC with Differential/Platelet   Meds ordered this encounter  Medications  . ibuprofen (ADVIL) tablet 600 mg  . traMADol (ULTRAM) tablet 100 mg   MDM + fetal heart tones via doppler  UA.  CBC with diff Limited US Ibuprofen 600 mg PO given  Report given to V. Tamala Julian, CNM who resumes care of the patient.  Rasch, Anderson Malta I, NP  1000: [place] Nml. CL 5.1 cm, Nml ovaries. Pain slightly improved to 6/10, but still very uncomfortable. Frequently shifting positions, wincing. Leukocytosis w/ left shift present. Reexamined pt's abd and breast abscess. + rebound tenderness, guarding at McBurney's point. Right breast 6cm area of severe tenderness, warmth, induration. Scarring present from previous abscesses. Scarring also present under left breast and bilat axillae, bilat groin C/W Hidradenitis Suppurativa.Pt is afebrile. Call Dr. Kennon Rounds who is in [place] significantly concerned w/ exam + leukocytosis. Discussed imaging w/ Radiologist  D.r Tery Sanfilippo. Will order MR to eval appendix.    MR PELVIS WO CONTRAST/MR ABDOMEN WO CONTRAST  Result Date: 10/23/2019 CLINICAL DATA:  Right lower quadrant pain for 1 week.  Leukocytosis. EXAM: MRI ABDOMEN AND PELVIS WITHOUT CONTRAST TECHNIQUE: Multiplanar multisequence MR imaging of the abdomen and pelvis was performed. No intravenous contrast was administered. COMPARISON:  Abdomen pelvis CT 05/13/2017 FINDINGS: COMBINED FINDINGS FOR BOTH MR ABDOMEN AND PELVIS Lower chest: Unremarkable. Hepatobiliary: No focal abnormality in the liver on this study without intravenous contrast. There is no evidence for gallstones, gallbladder wall thickening, or pericholecystic fluid. No intrahepatic or extrahepatic biliary dilation. Pancreas: No focal mass lesion. No dilatation of the main duct. No intraparenchymal cyst. No peripancreatic edema. Spleen:  No splenomegaly. No focal mass lesion. Adrenals/Urinary Tract: No adrenal nodule or mass. Kidneys unremarkable. Specifically, no evidence for hydroureteronephrosis on either side. Stomach/Bowel: Stomach is unremarkable. No gastric wall thickening. No evidence of outlet obstruction. Duodenum is normally positioned as is the ligament of Treitz. No small bowel wall thickening. No small bowel dilatation. Terminal ileum not discretely identified. The appendix is not discretely discernible on this study, but there is no edema or evidence of inflammatory change around the cecum. No fluid in the right lower quadrant around the cecum. No gross colonic mass. No colonic wall thickening. Vascular/Lymphatic: No abdominal aortic aneurysm. No abdominal lymphadenopathy no pelvic lymphadenopathy Reproductive: Single intrauterine gestation identified. Posterior placenta is homogeneous without overt thickening. Right ovary is high in the pelvis, at the level of the iliac crest and has normal imaging features. Left ovary is high in the left pelvis and normal by MR imaging. Other: No substantial  intra  peritoneal free fluid. There may be trace fluid in the right pelvis. Musculoskeletal: No abnormal marrow signal within the visualized bony anatomy. IMPRESSION: 1. Appendix not visible on this exam. There is no edema, fluid, or inflammatory change in the pericecal right lower abdomen to suggest secondary findings of acute appendicitis. 2. No right-sided hydroureteronephrosis. 3. Normal right ovary. 4. Possible very trace free fluid in the pelvis, but certainly no substantial intraperitoneal free fluid on this exam. Electronically Signed   By: Misty Stanley M.D.   On: 10/23/2019 13:19   Discussed history, exam, labs, imaging with Dr. Kennon Rounds.  Agrees with plan of care.  Recommends follow-up with [facility] surgery rather than breast center for abscess.  - RLQ of unknown etiology.  Possible round ligament pain.  No evidence of appendicitis, ovarian cyst or torsion or preterm labor or other emergent condition. Comfort measures, Ultram as needed.  -Right breast abscess with history of what appears to be hidradenitis suppurativa and recurrent abscesses under her breasts, bilateral axilla and groin.  Abscess is nonfluctuant.  No evidence of sepsis.  Per Dr. Kennon Rounds will start antibiotics and refer to [facility] surgery.  Patient instructed to follow-up at regular ED for fever greater than 100.4, significant worsening of condition or symptoms of systemic infection.    Assessment and Plan   1. Abdominal pain during pregnancy, second trimester   2. Right lower quadrant pain   3. Obstetric breast abscess, second trimester   4. Leukocytosis, unspecified type      D/C home in stable condition per consult with Dr. Kennon Rounds. Abscess precautions. Abdominal pain and pregnancy precautions. Follow-up Information    Surgery, [facility] Follow up.   Specialty: General Surgery Why: will call you to schedule appointment  Contact information: [address] [place]  [address] [place] for [facility] Follow up.   Specialty: Obstetrics and Gynecology Why: as scheduled for prenatal appointment  Contact information: [address] [place], [place] [id number] [place] [id number] [place] Follow up.   Specialty: Emergency Medicine Why: for breast abscess emergencies (fever greater than 100.4, chills, body aches, spreading infection) Contact information: [address] [phone] [place] Follow up.   Why: for primary care, long-term management of Hidradenitis Suppurativa and hypertension  Contact information: [place] [phone]         Allergies as of 10/23/2019      Reactions   Penicillins Hives   Has patient had a PCN reaction causing immediate rash, facial/tongue/throat swelling, SOB or lightheadedness with hypotension: Yes Has patient had a PCN reaction causing severe rash involving mucus membranes or skin necrosis: Yes Has patient had a PCN reaction that required hospitalization No Has patient had a PCN reaction occurring within the last 10 years: Yes  If all of the above answers are "NO", then may proceed with Cephalosporin use.   Morphine And Related Rash   Bactrim [sulfamethoxazole-trimethoprim] Hives      Medication List    TAKE these medications   acetaminophen 500 MG tablet Commonly known as: TYLENOL Take 500 mg by mouth every 6 (six) hours as needed for mild pain.   aspirin EC 81 MG tablet Take 1 tablet (81 mg total) by mouth daily.  Blood Pressure Kit Devi 1 Device by Does not apply route daily. ICD 10: Z34.00   cephALEXin 500 MG capsule Commonly known as: KEFLEX Take 1 capsule (500 mg total) by mouth 4 (four) times daily for 10 days.   cyclobenzaprine 10 MG  tablet Commonly known as: FLEXERIL Take 0.5-1 tablets (5-10 mg total) by mouth 3 (three) times daily as needed for muscle spasms.   ondansetron 4 MG tablet Commonly known as: ZOFRAN Take by mouth.   PRENATAL GUMMIES/DHA & FA PO Take 2 tablets by mouth daily.   traMADol 50 MG tablet Commonly known as: Ultram Take 1-2 tablets (50-100 mg total) by mouth every 6 (six) hours as needed for severe pain.      Tamala Julian, [place], [place] 10/23/2019 2:09 PM

## 2019-10-23 NOTE — Progress Notes (Signed)
Referred to General Surgery for acute right breast abscess management and to [facility] Medicine for long-term management and for PCP.

## 2019-10-24 ENCOUNTER — Telehealth: Payer: Self-pay | Admitting: General Practice

## 2019-10-24 NOTE — Telephone Encounter (Signed)
Called [facility] who states patient needs to come by office to pick up new patient packet, fill it out, bring it back in then an appt will be scheduled. Will send mychart message to patient with information.  [facility] Surgery and left message on referral coordinator's voicemail to call back regarding referral for this patient.

## 2019-10-24 NOTE — Telephone Encounter (Signed)
-----   Message from Vivien Rota sent at 10/24/2019  1:15 PM EST ----- Regarding: FW: Needs referral to [facility] surgery ASAP and [facility] family medicine  ----- Message ----- From: Dorathy Kinsman, CNM Sent: 10/23/2019   7:47 PM EST To: [facility] Admin Pool Subject: Needs referral to [facility] surgery A#  Needs referral to [facility] surgery ASAP for breast abscess.   Needs referral to [facility] family medicine for hidradenitis suppurativa and primary care.  Has chronic hypertension.

## 2019-10-25 ENCOUNTER — Telehealth (INDEPENDENT_AMBULATORY_CARE_PROVIDER_SITE_OTHER): Payer: Medicaid Other | Admitting: Medical

## 2019-10-25 DIAGNOSIS — N898 Other specified noninflammatory disorders of vagina: Secondary | ICD-10-CM

## 2019-10-25 MED ORDER — TERCONAZOLE 0.4 % VA CREA: 1.0000 | TOPICAL_CREAM | Freq: Every day | VAGINAL | 0 refills | Status: DC

## 2019-10-25 NOTE — Addendum Note (Signed)
Addended by: Ed Blalock on: 10/25/2019 09:03 AM   Modules accepted: Orders

## 2019-10-25 NOTE — Telephone Encounter (Signed)
The patient stated she is experiencing burning and itching. Would like medication for a yeast infection sent to [facility] on [address]. She stated the hospital has on antibiotics (cephalaxin). She would like to know if she needs to wait until after she completed the prescription or can she take it now?  The patient available for messaging via mychart if needed.

## 2019-10-25 NOTE — Telephone Encounter (Signed)
Called pt. Pt with symptoms of yeast infection. She has started ATB on the 24th and has to take for 10 days. Pt notified that Terazol Cream was sent to her pharmacy. Pt voiced understanding.

## 2019-11-01 ENCOUNTER — Encounter: Payer: Self-pay | Admitting: *Deleted

## 2019-11-02 ENCOUNTER — Other Ambulatory Visit: Payer: Self-pay

## 2019-11-02 ENCOUNTER — Telehealth (INDEPENDENT_AMBULATORY_CARE_PROVIDER_SITE_OTHER): Payer: Medicaid Other | Admitting: Obstetrics and Gynecology

## 2019-11-02 DIAGNOSIS — O2342 Unspecified infection of urinary tract in pregnancy, second trimester: Secondary | ICD-10-CM

## 2019-11-02 DIAGNOSIS — B951 Streptococcus, group B, as the cause of diseases classified elsewhere: Secondary | ICD-10-CM | POA: Diagnosis not present

## 2019-11-02 DIAGNOSIS — O0992 Supervision of high risk pregnancy, unspecified, second trimester: Secondary | ICD-10-CM

## 2019-11-02 DIAGNOSIS — O10912 Unspecified pre-existing hypertension complicating pregnancy, second trimester: Secondary | ICD-10-CM

## 2019-11-02 DIAGNOSIS — Z3A22 22 weeks gestation of pregnancy: Secondary | ICD-10-CM

## 2019-11-02 DIAGNOSIS — O10919 Unspecified pre-existing hypertension complicating pregnancy, unspecified trimester: Secondary | ICD-10-CM

## 2019-11-02 NOTE — Progress Notes (Signed)
I connected with  Janice Duarte on 11/02/19 at 10:55 AM EST by telephone and verified that I am speaking with the correct person using two identifiers.   I discussed the limitations, risks, security and privacy concerns of performing an evaluation and management service by telephone and the availability of in person appointments. I also discussed with the patient that there may be a patient responsible charge related to this service. The patient expressed understanding and agreed to proceed.  [name], CMA 11/02/2019  10:58 AM

## 2019-11-03 NOTE — Progress Notes (Signed)
   TELEHEALTH VIRTUAL OBSTETRICS VISIT ENCOUNTER NOTE  Clinic: [facility]  I connected with Janice Duarte on 11/02/19 at 10:55 AM EST by telephone at home and verified that I am speaking with the correct person using two identifiers.   I discussed the limitations, risks, security and privacy concerns of performing an evaluation and management service by telephone and the availability of in person appointments. I also discussed with the patient that there may be a patient responsible charge related to this service. The patient expressed understanding and agreed to proceed.  Subjective:  Janice Duarte is a 29 y.o. G1P0000 at [redacted]w[redacted]d being followed for ongoing prenatal care.  She is currently monitored for the following issues for this high-risk pregnancy and has Chronic hypertension affecting pregnancy; E. coli UTI (urinary tract infection); Supervision of high-risk pregnancy; GBS (group B streptococcus) UTI complicating pregnancy; and Hidradenitis suppurativa on their problem list.  Patient reports no complaints. Reports fetal movement. Denies any contractions, bleeding or leaking of fluid.   The following portions of the patient's history were reviewed and updated as appropriate: allergies, current medications, past family history, past medical history, past social history, past surgical history and problem list.   Objective:   Vitals:   11/02/19 1059  BP: (!) 146/86  Pulse: (!) 116    Babyscripts Data Reviewed: yes  General:  Alert, oriented and cooperative.   Mental Status: Normal mood and affect perceived. Normal judgment and thought content.  Rest of physical exam deferred due to type of encounter  Assessment and Plan:  Pregnancy: G1P0000 at [redacted]w[redacted]d 1. Group B Streptococcus urinary tract infection affecting pregnancy in second trimester Neg toc  2. Supervision of high risk pregnancy in second trimester Has surveillance growth on 2/11  3. Chronic  hypertension affecting pregnancy Doing well on no meds. Continue low dose asa  Preterm labor symptoms and general obstetric precautions including but not limited to vaginal bleeding, contractions, leaking of fluid and fetal movement were reviewed in detail with the patient.  I discussed the assessment and treatment plan with the patient. The patient was provided an opportunity to ask questions and all were answered. The patient agreed with the plan and demonstrated an understanding of the instructions. The patient was advised to call back or seek an in-person office evaluation/go to [facility]U at [facility] for any urgent or concerning symptoms. Please refer to After Visit Summary for other counseling recommendations.   I provided 7 minutes of non-face-to-face time during this encounter. The visit was conducted via MyChart-medicine  Return in about 8 days (around 11/10/2019) for RN visit to review BPs. 3wk virtual visit hrob.  Future Appointments  Date Time Provider Department Center  11/10/2019  8:15 AM WH-MFC NURSE WH-MFC MFC-US  11/10/2019  8:15 AM WH-MFC [place] 4 WH-MFCUS MFC-US  11/10/2019  9:20 AM WOC-WOCA NURSE WOC-WOCA WOC  11/23/2019  2:15 PM Anyanwu, Jethro Bastos, MD WOC-WOCA WOC  12/14/2019  8:20 AM WOC-WOCA LAB WOC-WOCA WOC  12/14/2019  9:15 AM Anyanwu, Jethro Bastos, MD WOC-WOCA WOC    Marlton Bing, MD [facility]

## 2019-11-10 ENCOUNTER — Other Ambulatory Visit: Payer: Self-pay

## 2019-11-10 ENCOUNTER — Ambulatory Visit (HOSPITAL_COMMUNITY): Payer: Medicaid Other | Admitting: *Deleted

## 2019-11-10 ENCOUNTER — Ambulatory Visit (HOSPITAL_COMMUNITY)
Admission: RE | Admit: 2019-11-10 | Discharge: 2019-11-10 | Disposition: A | Discharge status: Home / Self Care | Payer: Medicaid Other | Source: Ambulatory Visit | Attending: Obstetrics and Gynecology | Admitting: Obstetrics and Gynecology

## 2019-11-10 ENCOUNTER — Ambulatory Visit (INDEPENDENT_AMBULATORY_CARE_PROVIDER_SITE_OTHER): Payer: Medicaid Other

## 2019-11-10 ENCOUNTER — Other Ambulatory Visit (HOSPITAL_COMMUNITY): Payer: Self-pay | Admitting: *Deleted

## 2019-11-10 ENCOUNTER — Encounter (HOSPITAL_COMMUNITY): Payer: Self-pay

## 2019-11-10 VITALS — BP 118/78

## 2019-11-10 DIAGNOSIS — Z3A24 24 weeks gestation of pregnancy: Secondary | ICD-10-CM

## 2019-11-10 DIAGNOSIS — O0992 Supervision of high risk pregnancy, unspecified, second trimester: Secondary | ICD-10-CM

## 2019-11-10 DIAGNOSIS — O10012 Pre-existing essential hypertension complicating pregnancy, second trimester: Secondary | ICD-10-CM | POA: Diagnosis not present

## 2019-11-10 DIAGNOSIS — O10912 Unspecified pre-existing hypertension complicating pregnancy, second trimester: Secondary | ICD-10-CM | POA: Insufficient documentation

## 2019-11-10 DIAGNOSIS — O10919 Unspecified pre-existing hypertension complicating pregnancy, unspecified trimester: Secondary | ICD-10-CM | POA: Insufficient documentation

## 2019-11-10 DIAGNOSIS — O10913 Unspecified pre-existing hypertension complicating pregnancy, third trimester: Secondary | ICD-10-CM

## 2019-11-10 DIAGNOSIS — Z0130 Encounter for examination of blood pressure without abnormal findings: Secondary | ICD-10-CM

## 2019-11-10 DIAGNOSIS — Z362 Encounter for other antenatal screening follow-up: Secondary | ICD-10-CM | POA: Diagnosis not present

## 2019-11-10 NOTE — Progress Notes (Signed)
Patient seen and assessed by nursing staff during this encounter. I have reviewed the chart and agree with the documentation and plan.  Elanie Hammitt, MD 11/10/2019 10:17 AM    

## 2019-11-10 NOTE — Progress Notes (Signed)
Pt here today for review of BP.  Pt had an appt with MFM this am BP documented as 118/78.  Reported BP value to Dr. Macon Large who agreed that the BP from MFM is sufficient.  Pt continue to monitor for sx's of HTN.  Pt completed Home Medicaid Form.    Addison Naegeli, RN 11/10/19

## 2019-11-11 ENCOUNTER — Encounter: Payer: Self-pay | Admitting: *Deleted

## 2019-11-23 ENCOUNTER — Telehealth (INDEPENDENT_AMBULATORY_CARE_PROVIDER_SITE_OTHER): Payer: Medicaid Other | Admitting: Obstetrics & Gynecology

## 2019-11-23 ENCOUNTER — Encounter: Payer: Self-pay | Admitting: Obstetrics & Gynecology

## 2019-11-23 DIAGNOSIS — Z3A25 25 weeks gestation of pregnancy: Secondary | ICD-10-CM

## 2019-11-23 DIAGNOSIS — O0992 Supervision of high risk pregnancy, unspecified, second trimester: Secondary | ICD-10-CM

## 2019-11-23 DIAGNOSIS — O10912 Unspecified pre-existing hypertension complicating pregnancy, second trimester: Secondary | ICD-10-CM

## 2019-11-23 DIAGNOSIS — O10919 Unspecified pre-existing hypertension complicating pregnancy, unspecified trimester: Secondary | ICD-10-CM

## 2019-11-23 NOTE — Progress Notes (Signed)
TELEHEALTH OBSTETRICS PRENATAL VISIT ENCOUNTER NOTE  Provider location: [facility] [place]   I connected with Janice Duarte on 11/23/19 at  1:55 PM EST by Phone Encounter at home and verified that I am speaking with the correct person using two identifiers. Unable to connect via video because of technical difficulties.    I discussed the limitations, risks, security and privacy concerns of performing an evaluation and management service virtually and the availability of in person appointments. I also discussed with the patient that there may be a patient responsible charge related to this service. The patient expressed understanding and agreed to proceed. Subjective:  Janice Duarte is a 29 y.o. G1P0000 at [redacted]w[redacted]d being seen today for ongoing prenatal care.  She is currently monitored for the following issues for this high-risk pregnancy and has Chronic hypertension affecting pregnancy; E. coli UTI (urinary tract infection); Supervision of high-risk pregnancy; GBS (group B streptococcus) UTI complicating pregnancy; and Hidradenitis suppurativa on their problem list.  Patient reports no complaints.  Contractions: Not present. Vag. Bleeding: None.  Movement: Present. Denies any leaking of fluid.   The following portions of the patient's history were reviewed and updated as appropriate: allergies, current medications, past family history, past medical history, past social history, past surgical history and problem list.   Objective:   Vitals:   11/23/19 0849  BP: 134/69  Pulse: 88    Fetal Status:     Movement: Present     General:  Alert, oriented and cooperative. Patient is in no acute distress.  Respiratory: Normal respiratory effort, no problems with respiration noted  Mental Status: Normal mood and affect. Normal behavior. Normal judgment and thought content.  Rest of physical exam deferred due to type of encounter  Imaging: [place] MFM OB FOLLOW UP  Result  Date: 11/10/2019 ----------------------------------------------------------------------  OBSTETRICS REPORT                       (Signed Final 11/10/2019 09:46 am) ---------------------------------------------------------------------- Patient Info  ID #:       [id number]                          D.O.B.:  May 16, 1991 (29 yrs)  Name:       Janice Duarte              Visit Date: 11/10/2019 09:08 am ---------------------------------------------------------------------- Performed By  Performed By:     Hubert Azure          Ref. Address:     [address]                    [place], [place]  [address]  Attending:        Noralee Space MD        [facility]  Referred By:      Adam Phenix                    MD ---------------------------------------------------------------------- Orders   #  Description                          Code         Ordered By   1  [place] MFM OB FOLLOW UP                  [phone]     Noralee Space  ----------------------------------------------------------------------   #  Order #                    Accession #                 Episode #   1  [id number]                  [id number]                  [id number]  ---------------------------------------------------------------------- Indications   [redacted] weeks gestation of pregnancy                Z3A.24   Encounter for antenatal screening for          Z36.3   malformations (Low Risk NIPS)   Genetic carrier (Silent Carrier Alpha Thal)    Z14.8   Genetic carrier (Sickle Cell Disease)          Z14.8   Hypertension - Chronic/Pre-existing (ASA)      O10.019  ---------------------------------------------------------------------- Fetal Evaluation  Num Of Fetuses:         1  Fetal Heart  Rate(bpm):  124  Cardiac Activity:       Observed  Presentation:           Cephalic  Placenta:               Posterior  P. Cord Insertion:      Visualized  Amniotic Fluid  AFI FV:      Within normal limits                              Largest Pocket(cm)                              5.73 ---------------------------------------------------------------------- Biometry  BPD:      58.6  mm     G. Age:  24w 0d         43  %    CI:        71.93   %    70 - 86  FL/HC:      19.5   %    18.7 - 20.9  HC:      219.9  mm     G. Age:  24w 0d         33  %    HC/AC:      1.14        1.05 - 1.21  AC:      192.9  mm     G. Age:  24w 0d         41  %    FL/BPD:     73.0   %    71 - 87  FL:       42.8  mm     G. Age:  24w 0d         37  %    FL/AC:      22.2   %    20 - 24  LV:        4.9  mm  Est. FW:     650  gm      1 lb 7 oz     41  % ---------------------------------------------------------------------- OB History  Gravidity:    1         Term:   0  Living:       0 ---------------------------------------------------------------------- Gestational Age  LMP:           24w 0d        Date:  05/26/19                 EDD:   03/01/20  U/S Today:     24w 0d                                        EDD:   03/01/20  Best:          24w 0d     Det. By:  LMP  (05/26/19)          EDD:   03/01/20 ---------------------------------------------------------------------- Anatomy  Cranium:               Appears normal         Aortic Arch:            Appears normal  Cavum:                 Appears normal         Ductal Arch:            Appears normal  Ventricles:            Appears normal         Diaphragm:              Appears normal  Choroid Plexus:        Appears normal         Stomach:                Appears normal, left  sided  Cerebellum:            Previously seen        Abdomen:                Appears normal  Posterior  Fossa:       Previously seen        Abdominal Wall:         Previously seen  Nuchal Fold:           Previously seen        Cord Vessels:           Appears normal (3                                                                        vessel cord)  Face:                  Orbits and profile     Kidneys:                Appear normal                         previously seen  Lips:                  Previously seen        Bladder:                Appears normal  Thoracic:              Appears normal         Spine:                  Appears normal  Heart:                 Appears normal         Upper Extremities:      Previously seen                         (4CH, axis, and                         situs)  RVOT:                  Appears normal         Lower Extremities:      Previously seen  LVOT:                  Appears normal  Other:  Female gender. Nasal bone previously visualized. ---------------------------------------------------------------------- Cervix Uterus Adnexa  Cervix  Length:            3.5  cm.  Normal appearance by transabdominal scan.  Uterus  No abnormality visualized.  Left Ovary  Within normal limits.  Right Ovary  Within normal limits.  Adnexa  No abnormality visualized. ---------------------------------------------------------------------- Impression  Patient with chronic hypertension return for fetal growth  assessment.  She does not take antihypertensives and her  blood pressures are well controlled.  BP today at our office is 1 1 8/78 mmHg.  Fetal growth is appropriate for gestational age.  Amniotic fluid  is normal good fetal activity seen.  We reassured the patient of the findings. ---------------------------------------------------------------------- Recommendations  -An appointment was made for her to return in 4 weeks for  fetal growth assessment. ----------------------------------------------------------------------                  Noralee Space, MD Electronically Signed Final Report    11/10/2019 09:46 am ----------------------------------------------------------------------   Assessment and Plan:  Pregnancy: G1P0000 at [redacted]w[redacted]d 1. Chronic hypertension affecting pregnancy Stable BP, continue ASA.  2. Supervision of high risk pregnancy in second trimester Preterm labor symptoms and general obstetric precautions including but not limited to vaginal bleeding, contractions, leaking of fluid and fetal movement were reviewed in detail with the patient. I discussed the assessment and treatment plan with the patient. The patient was provided an opportunity to ask questions and all were answered. The patient agreed with the plan and demonstrated an understanding of the instructions. The patient was advised to call back or seek an in-person office evaluation/go to MAU at [facility] for any urgent or concerning symptoms. Please refer to After Visit Summary for other counseling recommendations.   I provided 10 minutes of non face-to-face time during this encounter.  Return in about 3 weeks (around 12/14/2019) for 2 hr GTT, OFFICE OB Visit as already scheduled.  Future Appointments  Date Time Provider Department Center  11/23/2019  1:55 PM Macon Large Jethro Bastos, MD [facility] WOC  12/05/2019  8:15 AM WH-MFC NURSE WH-MFC MFC-US  12/05/2019  8:15 AM WH-MFC [place] 4 WH-MFCUS MFC-US  12/14/2019  8:20 AM WOC-WOCA LAB WOC-WOCA WOC  12/14/2019  9:15 AM Astin Sayre, Jethro Bastos, MD WOC-WOCA WOC    Jaynie Collins, MD Center for [facility]

## 2019-11-23 NOTE — Patient Instructions (Addendum)
Return to office for any scheduled appointments. Call the office or go to the [facility] [place] if:  You begin to have strong, frequent contractions  Your water breaks.  Sometimes it is a big gush of fluid, sometimes it is just a trickle that keeps getting your panties wet or running down your legs  You have vaginal bleeding.  It is normal to have a small amount of spotting if your cervix was checked.   You do not feel your baby moving like normal.  If you do not, get something to eat and drink and lay down and focus on feeling your baby move.   If your baby is still not moving like normal, you should call the office or go to MAU.  Any other obstetric concerns.  TDaP Vaccine Pregnancy Get the Whooping Cough Vaccine While You Are Pregnant (CDC)  It is important for women to get the whooping cough vaccine in the third trimester of each pregnancy. Vaccines are the best way to prevent this disease. There are 2 different whooping cough vaccines. Both vaccines combine protection against whooping cough, tetanus and diphtheria, but they are for different age groups: Tdap: for everyone 11 years or older, including pregnant women  DTaP: for children 2 months through 6 years of age  You need the whooping cough vaccine during each of your pregnancies The recommended time to get the shot is during your 27th through 36th week of pregnancy, preferably during the earlier part of this time period. The Centers for Disease Control and Prevention (CDC) recommends that pregnant women receive the whooping cough vaccine for adolescents and adults (called Tdap vaccine) during the third trimester of each pregnancy. The recommended time to get the shot is during your 27th through 36th week of pregnancy, preferably during the earlier part of this time period. This replaces the original recommendation that pregnant women get the vaccine only if they had not previously received it. The  American College of Obstetricians and Gynecologists and the American College of Nurse-Midwives support this recommendation.  You should get the whooping cough vaccine while pregnant to pass protection to your baby frame support disabled and/or not supported in this browser  Learn why Laura decided to get the whooping cough vaccine in her 3rd trimester of pregnancy and how her baby girl was born with some protection against the disease. Also available on YouTube. After receiving the whooping cough vaccine, your body will create protective antibodies (proteins produced by the body to fight off diseases) and pass some of them to your baby before birth. These antibodies provide your baby some short-term protection against whooping cough in early life. These antibodies can also protect your baby from some of the more serious complications that come along with whooping cough. Your protective antibodies are at their highest about 2 weeks after getting the vaccine, but it takes time to pass them to your baby. So the preferred time to get the whooping cough vaccine is early in your third trimester. The amount of whooping cough antibodies in your body decreases over time. That is why CDC recommends you get a whooping cough vaccine during each pregnancy. Doing so allows each of your babies to get the greatest number of protective antibodies from you. This means each of your babies will get the best protection possible against this disease.  Getting the whooping cough vaccine while pregnant is better than getting the vaccine after you give birth Whooping cough vaccination during   pregnancy is ideal so your baby will have short-term protection as soon as he is born. This early protection is important because your baby will not start getting his whooping cough vaccines until he is 2 months old. These first few months of life are when your baby is at greatest risk for catching whooping cough. This is also when he's at  greatest risk for having severe, potentially life-threating complications from the infection. To avoid that gap in protection, it is best to get a whooping cough vaccine during pregnancy. You will then pass protection to your baby before he is born. To continue protecting your baby, he should get whooping cough vaccines starting at 2 months old. You may never have gotten the Tdap vaccine before and did not get it during this pregnancy. If so, you should make sure to get the vaccine immediately after you give birth, before leaving the hospital or birthing center. It will take about 2 weeks before your body develops protection (antibodies) in response to the vaccine. Once you have protection from the vaccine, you are less likely to give whooping cough to your newborn while caring for him. But remember, your baby will still be at risk for catching whooping cough from others. A recent study looked to see how effective Tdap was at preventing whooping cough in babies whose mothers got the vaccine while pregnant or in the hospital after giving birth. The study found that getting Tdap between 27 through 36 weeks of pregnancy is 85% more effective at preventing whooping cough in babies younger than 2 months old. Blood tests cannot tell if you need a whooping cough vaccine There are no blood tests that can tell you if you have enough antibodies in your body to protect yourself or your baby against whooping cough. Even if you have been sick with whooping cough in the past or previously received the vaccine, you still should get the vaccine during each pregnancy. Breastfeeding may pass some protective antibodies onto your baby By breastfeeding, you may pass some antibodies you have made in response to the vaccine to your baby. When you get a whooping cough vaccine during your pregnancy, you will have antibodies in your breast milk that you can share with your baby as soon as your milk comes in. However, your baby will not  get protective antibodies immediately if you wait to get the whooping cough vaccine until after delivering your baby. This is because it takes about 2 weeks for your body to create antibodies. Learn more about the health benefits of breastfeeding.  

## 2019-12-05 ENCOUNTER — Other Ambulatory Visit (HOSPITAL_COMMUNITY): Payer: Self-pay | Admitting: *Deleted

## 2019-12-05 ENCOUNTER — Encounter (HOSPITAL_COMMUNITY): Payer: Self-pay

## 2019-12-05 ENCOUNTER — Ambulatory Visit (HOSPITAL_COMMUNITY): Payer: Medicaid Other | Admitting: *Deleted

## 2019-12-05 ENCOUNTER — Other Ambulatory Visit: Payer: Self-pay

## 2019-12-05 ENCOUNTER — Ambulatory Visit (HOSPITAL_COMMUNITY)
Admission: RE | Admit: 2019-12-05 | Discharge: 2019-12-05 | Disposition: A | Discharge status: Home / Self Care | Payer: Medicaid Other | Source: Ambulatory Visit | Attending: Obstetrics and Gynecology | Admitting: Obstetrics and Gynecology

## 2019-12-05 DIAGNOSIS — O10919 Unspecified pre-existing hypertension complicating pregnancy, unspecified trimester: Secondary | ICD-10-CM | POA: Diagnosis present

## 2019-12-05 DIAGNOSIS — O0992 Supervision of high risk pregnancy, unspecified, second trimester: Secondary | ICD-10-CM | POA: Insufficient documentation

## 2019-12-05 DIAGNOSIS — O10012 Pre-existing essential hypertension complicating pregnancy, second trimester: Secondary | ICD-10-CM

## 2019-12-05 DIAGNOSIS — O10913 Unspecified pre-existing hypertension complicating pregnancy, third trimester: Secondary | ICD-10-CM | POA: Diagnosis present

## 2019-12-05 DIAGNOSIS — Z3A27 27 weeks gestation of pregnancy: Secondary | ICD-10-CM

## 2019-12-05 DIAGNOSIS — Z362 Encounter for other antenatal screening follow-up: Secondary | ICD-10-CM | POA: Diagnosis not present

## 2019-12-08 ENCOUNTER — Other Ambulatory Visit: Payer: Self-pay

## 2019-12-08 DIAGNOSIS — O0992 Supervision of high risk pregnancy, unspecified, second trimester: Secondary | ICD-10-CM

## 2019-12-14 ENCOUNTER — Other Ambulatory Visit: Payer: Self-pay

## 2019-12-14 ENCOUNTER — Other Ambulatory Visit: Payer: Medicaid Other

## 2019-12-14 ENCOUNTER — Ambulatory Visit (INDEPENDENT_AMBULATORY_CARE_PROVIDER_SITE_OTHER): Payer: Medicaid Other | Admitting: Obstetrics & Gynecology

## 2019-12-14 ENCOUNTER — Encounter: Payer: Self-pay | Admitting: Obstetrics & Gynecology

## 2019-12-14 VITALS — BP 108/72 | HR 87 | Wt 168.8 lb

## 2019-12-14 DIAGNOSIS — Z23 Encounter for immunization: Secondary | ICD-10-CM | POA: Diagnosis not present

## 2019-12-14 DIAGNOSIS — M549 Dorsalgia, unspecified: Secondary | ICD-10-CM | POA: Diagnosis not present

## 2019-12-14 DIAGNOSIS — O0992 Supervision of high risk pregnancy, unspecified, second trimester: Secondary | ICD-10-CM

## 2019-12-14 DIAGNOSIS — O10919 Unspecified pre-existing hypertension complicating pregnancy, unspecified trimester: Secondary | ICD-10-CM

## 2019-12-14 DIAGNOSIS — O0993 Supervision of high risk pregnancy, unspecified, third trimester: Secondary | ICD-10-CM

## 2019-12-14 DIAGNOSIS — Z3A28 28 weeks gestation of pregnancy: Secondary | ICD-10-CM

## 2019-12-14 DIAGNOSIS — O99891 Other specified diseases and conditions complicating pregnancy: Secondary | ICD-10-CM

## 2019-12-14 DIAGNOSIS — O10913 Unspecified pre-existing hypertension complicating pregnancy, third trimester: Secondary | ICD-10-CM | POA: Diagnosis not present

## 2019-12-14 DIAGNOSIS — O2343 Unspecified infection of urinary tract in pregnancy, third trimester: Secondary | ICD-10-CM

## 2019-12-14 MED ORDER — MISC. DEVICES MISC: 0 refills | Status: DC

## 2019-12-14 MED ORDER — COMFORT FIT MATERNITY SUPP MED MISC: 1.0000 | Freq: Once | 0 refills | Status: AC

## 2019-12-14 MED ORDER — CYCLOBENZAPRINE HCL 10 MG PO TABS: 10.0000 mg | ORAL_TABLET | Freq: Three times a day (TID) | ORAL | 1 refills | Status: DC | PRN

## 2019-12-14 NOTE — Progress Notes (Signed)
   PRENATAL VISIT NOTE  Subjective:  Janice Duarte is a 29 y.o. G1P0000 at [redacted]w[redacted]d being seen today for ongoing prenatal care.  She is currently monitored for the following issues for this high-risk pregnancy and has Chronic hypertension affecting pregnancy; E. coli UTI (urinary tract infection); Supervision of high-risk pregnancy; GBS (group B streptococcus) UTI complicating pregnancy; and Hidradenitis suppurativa on their problem list.  Patient reports backache.  Contractions: Irritability. Vag. Bleeding: None.  Movement: Present. Denies leaking of fluid.   The following portions of the patient's history were reviewed and updated as appropriate: allergies, current medications, past family history, past medical history, past social history, past surgical history and problem list.   Objective:   Vitals:   12/14/19 0847  BP: 108/72  Pulse: 87  Weight: 168 lb 12.8 oz (76.6 kg)    Fetal Status: Fetal Heart Rate (bpm): 154   Movement: Present     General:  Alert, oriented and cooperative. Patient is in no acute distress.  Skin: Skin is warm and dry. No rash noted.   Cardiovascular: Normal heart rate noted  Respiratory: Normal respiratory effort, no problems with respiration noted  Abdomen: Soft, gravid, appropriate for gestational age.  Pain/Pressure: Absent     Pelvic: Cervical exam deferred        Extremities: Normal range of motion.  Edema: None  Mental Status: Normal mood and affect. Normal behavior. Normal judgment and thought content.   Assessment and Plan:  Pregnancy: G1P0000 at [redacted]w[redacted]d 1. Chronic hypertension affecting pregnancy BP stable, continue ASA.  Growth scans scheduled as per MFM. Will need antenatal testing later in pregnancy. Surveillance labs today. - Comprehensive metabolic panel - Protein / creatinine ratio, urine  2. UTI (urinary tract infection) during pregnancy, third trimester Surveillance culture today. - Culture, OB Urine  3. Back pain affecting  pregnancy in third trimester Recommended Tylenol, Flexeril prescribed. Maternity belt prescribed, also gave handout about back exercises and other interventions.  Third trimestter labs today, Tdap also given today. - Misc. Devices MISC; Dispense one maternity belt for patient  Dispense: 1 each; Refill: 0 - cyclobenzaprine (FLEXERIL) 10 MG tablet; Take 1 tablet (10 mg total) by mouth every 8 (eight) hours as needed for muscle spasms.  Dispense: 30 tablet; Refill: 1  4. Supervision of high risk pregnancy in third trimester Preterm labor symptoms and general obstetric precautions including but not limited to vaginal bleeding, contractions, leaking of fluid and fetal movement were reviewed in detail with the patient. Please refer to After Visit Summary for other counseling recommendations.   Return in about 2 weeks (around 12/28/2019) for OFFICE OB Visit.  Future Appointments  Date Time Provider Department Center  12/14/2019  9:15 AM Macon Large, Jethro Bastos, MD WOC-WOCA WOC  01/09/2020  8:00 AM WH-MFC NURSE WH-MFC MFC-US  01/09/2020  8:00 AM WH-MFC [place] 3 WH-MFCUS MFC-US    Jaynie Collins, MD

## 2019-12-14 NOTE — Patient Instructions (Addendum)
Return to office for any scheduled appointments. Call the office or go to the MAU at [facility] if:  You begin to have strong, frequent contractions  Your water breaks.  Sometimes it is a big gush of fluid, sometimes it is just a trickle that keeps getting your panties wet or running down your legs  You have vaginal bleeding.  It is normal to have a small amount of spotting if your cervix was checked.   You do not feel your baby moving like normal.  If you do not, get something to eat and drink and lay down and focus on feeling your baby move.   If your baby is still not moving like normal, you should call the office or go to MAU.  Any other obstetric concerns.   Third Trimester of Pregnancy The third trimester is from week 28 through week 40 (months 7 through 9). The third trimester is a time when the unborn baby (fetus) is growing rapidly. At the end of the ninth month, the fetus is about 20 inches in length and weighs 6-10 pounds. Body changes during your third trimester Your body will continue to go through many changes during pregnancy. The changes vary from woman to woman. During the third trimester:  Your weight will continue to increase. You can expect to gain 25-35 pounds (11-16 kg) by the end of the pregnancy.  You may begin to get stretch marks on your hips, abdomen, and breasts.  You may urinate more often because the fetus is moving lower into your pelvis and pressing on your bladder.  You may develop or continue to have heartburn. This is caused by increased hormones that slow down muscles in the digestive tract.  You may develop or continue to have constipation because increased hormones slow digestion and cause the muscles that push waste through your intestines to relax.  You may develop hemorrhoids. These are swollen veins (varicose veins) in the rectum that can itch or be painful.  You may develop swollen, bulging veins (varicose veins)  in your legs.  You may have increased body aches in the pelvis, back, or thighs. This is due to weight gain and increased hormones that are relaxing your joints.  You may have changes in your hair. These can include thickening of your hair, rapid growth, and changes in texture. Some women also have hair loss during or after pregnancy, or hair that feels dry or thin. Your hair will most likely return to normal after your baby is born.  Your breasts will continue to grow and they will continue to become tender. A yellow fluid (colostrum) may leak from your breasts. This is the first milk you are producing for your baby.  Your belly button may stick out.  You may notice more swelling in your hands, face, or ankles.  You may have increased tingling or numbness in your hands, arms, and legs. The skin on your belly may also feel numb.  You may feel short of breath because of your expanding uterus.  You may have more problems sleeping. This can be caused by the size of your belly, increased need to urinate, and an increase in your body's metabolism.  You may notice the fetus "dropping," or moving lower in your abdomen (lightening).  You may have increased vaginal discharge.  You may notice your joints feel loose and you may have pain around your pelvic bone. What to expect at prenatal visits You will have   prenatal exams every 2 weeks until week 36. Then you will have weekly prenatal exams. During a routine prenatal visit:  You will be weighed to make sure you and the baby are growing normally.  Your blood pressure will be taken.  Your abdomen will be measured to track your baby's growth.  The fetal heartbeat will be listened to.  Any test results from the previous visit will be discussed.  You may have a cervical check near your due date to see if your cervix has softened or thinned (effaced).  You will be tested for Group B streptococcus. This happens between 35 and 37 weeks. Your  health care provider may ask you:  What your birth plan is.  How you are feeling.  If you are feeling the baby move.  If you have had any abnormal symptoms, such as leaking fluid, bleeding, severe headaches, or abdominal cramping.  If you are using any tobacco products, including cigarettes, chewing tobacco, and electronic cigarettes.  If you have any questions. Other tests or screenings that may be performed during your third trimester include:  Blood tests that check for low iron levels (anemia).  Fetal testing to check the health, activity level, and growth of the fetus. Testing is done if you have certain medical conditions or if there are problems during the pregnancy.  Nonstress test (NST). This test checks the health of your baby to make sure there are no signs of problems, such as the baby not getting enough oxygen. During this test, a belt is placed around your belly. The baby is made to move, and its heart rate is monitored during movement. What is false labor? False labor is a condition in which you feel small, irregular tightenings of the muscles in the womb (contractions) that usually go away with rest, changing position, or drinking water. These are called Braxton Hicks contractions. Contractions may last for hours, days, or even weeks before true labor sets in. If contractions come at regular intervals, become more frequent, increase in intensity, or become painful, you should see your health care provider. What are the signs of labor?  Abdominal cramps.  Regular contractions that start at 10 minutes apart and become stronger and more frequent with time.  Contractions that start on the top of the uterus and spread down to the lower abdomen and back.  Increased pelvic pressure and dull back pain.  A watery or bloody mucus discharge that comes from the vagina.  Leaking of amniotic fluid. This is also known as your "water breaking." It could be a slow trickle or a gush.  Let your health care provider know if it has a color or strange odor. If you have any of these signs, call your health care provider right away, even if it is before your due date. Follow these instructions at home: Medicines  Follow your health care provider's instructions regarding medicine use. Specific medicines may be either safe or unsafe to take during pregnancy.  Take a prenatal vitamin that contains at least 600 micrograms (mcg) of folic acid.  If you develop constipation, try taking a stool softener if your health care provider approves. Eating and drinking   Eat a balanced diet that includes fresh fruits and vegetables, whole grains, good sources of protein such as meat, eggs, or tofu, and low-fat dairy. Your health care provider will help you determine the amount of weight gain that is right for you.  Avoid raw meat and uncooked cheese. These carry germs that   can cause birth defects in the baby.  If you have low calcium intake from food, talk to your health care provider about whether you should take a daily calcium supplement.  Eat four or five small meals rather than three large meals a day.  Limit foods that are high in fat and processed sugars, such as fried and sweet foods.  To prevent constipation: ? Drink enough fluid to keep your urine clear or pale yellow. ? Eat foods that are high in fiber, such as fresh fruits and vegetables, whole grains, and beans. Activity  Exercise only as directed by your health care provider. Most women can continue their usual exercise routine during pregnancy. Try to exercise for 30 minutes at least 5 days a week. Stop exercising if you experience uterine contractions.  Avoid heavy lifting.  Do not exercise in extreme heat or humidity, or at high altitudes.  Wear low-heel, comfortable shoes.  Practice good posture.  You may continue to have sex unless your health care provider tells you otherwise. Relieving pain and  discomfort  Take frequent breaks and rest with your legs elevated if you have leg cramps or low back pain.  Take warm sitz baths to soothe any pain or discomfort caused by hemorrhoids. Use hemorrhoid cream if your health care provider approves.  Wear a good support bra to prevent discomfort from breast tenderness.  If you develop varicose veins: ? Wear support pantyhose or compression stockings as told by your healthcare provider. ? Elevate your feet for 15 minutes, 3-4 times a day. Prenatal care  Write down your questions. Take them to your prenatal visits.  Keep all your prenatal visits as told by your health care provider. This is important. Safety  Wear your seat belt at all times when driving.  Make a list of emergency phone numbers, including numbers for family, friends, the hospital, and police and fire departments. General instructions  Avoid cat litter boxes and soil used by cats. These carry germs that can cause birth defects in the baby. If you have a cat, ask someone to clean the litter box for you.  Do not travel far distances unless it is absolutely necessary and only with the approval of your health care provider.  Do not use hot tubs, steam rooms, or saunas.  Do not drink alcohol.  Do not use any products that contain nicotine or tobacco, such as cigarettes and e-cigarettes. If you need help quitting, ask your health care provider.  Do not use any medicinal herbs or unprescribed drugs. These chemicals affect the formation and growth of the baby.  Do not douche or use tampons or scented sanitary pads.  Do not cross your legs for long periods of time.  To prepare for the arrival of your baby: ? Take prenatal classes to understand, practice, and ask questions about labor and delivery. ? Make a trial run to the hospital. ? Visit the hospital and tour the maternity area. ? Arrange for maternity or paternity leave through employers. ? Arrange for family and  friends to take care of pets while you are in the hospital. ? Purchase a rear-facing car seat and make sure you know how to install it in your car. ? Pack your hospital bag. ? Prepare the baby's nursery. Make sure to remove all pillows and stuffed animals from the baby's crib to prevent suffocation.  Visit your dentist if you have not gone during your pregnancy. Use a soft toothbrush to brush your teeth and be   gentle when you floss. Contact a health care provider if:  You are unsure if you are in labor or if your water has broken.  You become dizzy.  You have mild pelvic cramps, pelvic pressure, or nagging pain in your abdominal area.  You have lower back pain.  You have persistent nausea, vomiting, or diarrhea.  You have an unusual or bad smelling vaginal discharge.  You have pain when you urinate. Get help right away if:  Your water breaks before 37 weeks.  You have regular contractions less than 5 minutes apart before 37 weeks.  You have a fever.  You are leaking fluid from your vagina.  You have spotting or bleeding from your vagina.  You have severe abdominal pain or cramping.  You have rapid weight loss or weight gain.  You have shortness of breath with chest pain.  You notice sudden or extreme swelling of your face, hands, ankles, feet, or legs.  Your baby makes fewer than 10 movements in 2 hours.  You have severe headaches that do not go away when you take medicine.  You have vision changes. Summary  The third trimester is from week 28 through week 40, months 7 through 9. The third trimester is a time when the unborn baby (fetus) is growing rapidly.  During the third trimester, your discomfort may increase as you and your baby continue to gain weight. You may have abdominal, leg, and back pain, sleeping problems, and an increased need to urinate.  During the third trimester your breasts will keep growing and they will continue to become tender. A yellow  fluid (colostrum) may leak from your breasts. This is the first milk you are producing for your baby.  False labor is a condition in which you feel small, irregular tightenings of the muscles in the womb (contractions) that eventually go away. These are called Braxton Hicks contractions. Contractions may last for hours, days, or even weeks before true labor sets in.  Signs of labor can include: abdominal cramps; regular contractions that start at 10 minutes apart and become stronger and more frequent with time; watery or bloody mucus discharge that comes from the vagina; increased pelvic pressure and dull back pain; and leaking of amniotic fluid. This information is not intended to replace advice given to you by your health care provider. Make sure you discuss any questions you have with your health care provider. Document Revised: 01/06/2019 Document Reviewed: 10/21/2016 Elsevier Patient Education  [place].  Back Pain in Pregnancy Back pain during pregnancy is common. Back pain may be caused by several factors that are related to changes during your pregnancy. Follow these instructions at home: Managing pain, stiffness, and swelling      If directed, for sudden (acute) back pain, put ice on the painful area. ? Put ice in a plastic bag. ? Place a towel between your skin and the bag. ? Leave the ice on for 20 minutes, 2-3 times per day.  If directed, apply heat to the affected area before you exercise. Use the heat source that your health care provider recommends, such as a moist heat pack or a heating pad. ? Place a towel between your skin and the heat source. ? Leave the heat on for 20-30 minutes. ? Remove the heat if your skin turns bright red. This is especially important if you are unable to feel pain, heat, or cold. You may have a greater risk of getting burned.  If directed, massage the  affected area. Activity  Exercise as told by your health care provider. Gentle  exercise is the best way to prevent or manage back pain.  Listen to your body when lifting. If lifting hurts, ask for help or bend your knees. This uses your leg muscles instead of your back muscles.  Squat down when picking up something from the floor. Do not bend over.  Only use bed rest for short periods as told by your health care provider. Bed rest should only be used for the most severe episodes of back pain. Standing, sitting, and lying down  Do not stand in one place for long periods of time.  Use good posture when sitting. Make sure your head rests over your shoulders and is not hanging forward. Use a pillow on your lower back if necessary.  Try sleeping on your side, preferably the left side, with a pregnancy support pillow or 1-2 regular pillows between your legs. ? If you have back pain after a night's rest, your bed may be too soft. ? A firm mattress may provide more support for your back during pregnancy. General instructions  Do not wear high heels.  Eat a healthy diet. Try to gain weight within your health care provider's recommendations.  Use a maternity girdle, elastic sling, or back brace as told by your health care provider.  Take over-the-counter and prescription medicines only as told by your health care provider.  Work with a physical therapist or massage therapist to find ways to manage back pain. Acupuncture or massage therapy may be helpful.  Keep all follow-up visits as told by your health care provider. This is important. Contact a health care provider if:  Your back pain interferes with your daily activities.  You have increasing pain in other parts of your body. Get help right away if:  You develop numbness, tingling, weakness, or problems with the use of your arms or legs.  You develop severe back pain that is not controlled with medicine.  You have a change in bowel or bladder control.  You develop shortness of breath, dizziness, or you  faint.  You develop nausea, vomiting, or sweating.  You have back pain that is a rhythmic, cramping pain similar to labor pains. Labor pain is usually 1-2 minutes apart, lasts for about 1 minute, and involves a bearing down feeling or pressure in your pelvis.  You have back pain and your water breaks or you have vaginal bleeding.  You have back pain or numbness that travels down your leg.  Your back pain developed after you fell.  You develop pain on one side of your back.  You see blood in your urine.  You develop skin blisters in the area of your back pain. Summary  Back pain may be caused by several factors that are related to changes during your pregnancy.  Follow instructions as told by your health care provider for managing pain, stiffness, and swelling.  Exercise as told by your health care provider. Gentle exercise is the best way to prevent or manage back pain.  Take over-the-counter and prescription medicines only as told by your health care provider.  Keep all follow-up visits as told by your health care provider. This is important. This information is not intended to replace advice given to you by your health care provider. Make sure you discuss any questions you have with your health care provider. Document Revised: 01/04/2019 Document Reviewed: 03/03/2018 Elsevier Patient Education  2020 [employer].

## 2019-12-14 NOTE — Addendum Note (Signed)
Addended by: Henrietta Dine on: 12/14/2019 09:21 AM   Modules accepted: Orders

## 2019-12-14 NOTE — Addendum Note (Signed)
Addended by: Henrietta Dine on: 12/14/2019 10:43 AM   Modules accepted: Orders

## 2019-12-15 LAB — CBC
Hematocrit: 34.8 % (ref 34.0–46.6)
Hemoglobin: 11.6 g/dL (ref 11.1–15.9)
MCH: 25.8 pg — ABNORMAL LOW (ref 26.6–33.0)
MCHC: 33.3 g/dL (ref 31.5–35.7)
MCV: 77 fL — ABNORMAL LOW (ref 79–97)
Platelets: 373 10*3/uL (ref 150–450)
RBC: 4.5 x10E6/uL (ref 3.77–5.28)
RDW: 13.8 % (ref 11.7–15.4)
WBC: 17 10*3/uL — ABNORMAL HIGH (ref 3.4–10.8)

## 2019-12-15 LAB — COMPREHENSIVE METABOLIC PANEL
ALT: 8 IU/L (ref 0–32)
AST: 15 IU/L (ref 0–40)
Albumin/Globulin Ratio: 1.1 — ABNORMAL LOW (ref 1.2–2.2)
Albumin: 3.6 g/dL — ABNORMAL LOW (ref 3.9–5.0)
Alkaline Phosphatase: 76 IU/L (ref 39–117)
BUN/Creatinine Ratio: 6 — ABNORMAL LOW (ref 9–23)
BUN: 3 mg/dL — ABNORMAL LOW (ref 6–20)
Bilirubin Total: <0.2 mg/dL (ref 0.0–1.2)
CO2: 21 mmol/L (ref 20–29)
Calcium: 9 mg/dL (ref 8.7–10.2)
Chloride: 99 mmol/L (ref 96–106)
Creatinine, Ser: 0.53 mg/dL — ABNORMAL LOW (ref 0.57–1.00)
GFR calc Af Amer: 150 mL/min/{1.73_m2} (ref >59)
GFR calc non Af Amer: 131 mL/min/{1.73_m2} (ref >59)
Globulin, Total: 3.2 g/dL (ref 1.5–4.5)
Glucose: 82 mg/dL (ref 65–99)
Potassium: 4.6 mmol/L (ref 3.5–5.2)
Sodium: 134 mmol/L (ref 134–144)
Total Protein: 6.8 g/dL (ref 6.0–8.5)

## 2019-12-15 LAB — PROTEIN / CREATININE RATIO, URINE
Creatinine, Urine: 127.3 mg/dL
Protein, Ur: 16.8 mg/dL
Protein/Creat Ratio: 132 mg/g creat (ref 0–200)

## 2019-12-15 LAB — GLUCOSE TOLERANCE, 2 HOURS W/ 1HR
Glucose, 1 hour: 159 mg/dL (ref 65–179)
Glucose, 2 hour: 111 mg/dL (ref 65–152)
Glucose, Fasting: 86 mg/dL (ref 65–91)

## 2019-12-15 LAB — HIV ANTIBODY (ROUTINE TESTING W REFLEX): HIV Screen 4th Generation wRfx: NONREACTIVE

## 2019-12-15 LAB — RPR: RPR Ser Ql: NONREACTIVE

## 2019-12-16 LAB — URINE CULTURE, OB REFLEX

## 2019-12-16 LAB — CULTURE, OB URINE

## 2019-12-20 ENCOUNTER — Encounter (HOSPITAL_COMMUNITY): Payer: Self-pay | Admitting: Obstetrics and Gynecology

## 2019-12-20 ENCOUNTER — Other Ambulatory Visit: Payer: Self-pay

## 2019-12-20 ENCOUNTER — Inpatient Hospital Stay (HOSPITAL_COMMUNITY)
Admission: AD | Admit: 2019-12-20 | Discharge: 2019-12-20 | Disposition: A | Discharge status: Home / Self Care | Payer: Medicaid Other | Attending: Obstetrics and Gynecology | Admitting: Obstetrics and Gynecology

## 2019-12-20 DIAGNOSIS — J029 Acute pharyngitis, unspecified: Secondary | ICD-10-CM | POA: Diagnosis not present

## 2019-12-20 DIAGNOSIS — R519 Headache, unspecified: Secondary | ICD-10-CM | POA: Insufficient documentation

## 2019-12-20 DIAGNOSIS — Z3689 Encounter for other specified antenatal screening: Secondary | ICD-10-CM

## 2019-12-20 DIAGNOSIS — O26893 Other specified pregnancy related conditions, third trimester: Secondary | ICD-10-CM | POA: Diagnosis not present

## 2019-12-20 DIAGNOSIS — Z20822 Contact with and (suspected) exposure to covid-19: Secondary | ICD-10-CM | POA: Insufficient documentation

## 2019-12-20 DIAGNOSIS — F1990 Other psychoactive substance use, unspecified, uncomplicated: Secondary | ICD-10-CM

## 2019-12-20 DIAGNOSIS — Z880 Allergy status to penicillin: Secondary | ICD-10-CM | POA: Diagnosis not present

## 2019-12-20 DIAGNOSIS — Z3A29 29 weeks gestation of pregnancy: Secondary | ICD-10-CM | POA: Diagnosis not present

## 2019-12-20 DIAGNOSIS — R0981 Nasal congestion: Secondary | ICD-10-CM | POA: Diagnosis not present

## 2019-12-20 DIAGNOSIS — O10919 Unspecified pre-existing hypertension complicating pregnancy, unspecified trimester: Secondary | ICD-10-CM

## 2019-12-20 DIAGNOSIS — O0993 Supervision of high risk pregnancy, unspecified, third trimester: Secondary | ICD-10-CM

## 2019-12-20 HISTORY — DX: Other psychoactive substance use, unspecified, uncomplicated: F19.90

## 2019-12-20 LAB — RAPID URINE DRUG SCREEN, HOSP PERFORMED
Amphetamines: NOT DETECTED
Barbiturates: NOT DETECTED
Benzodiazepines: NOT DETECTED
Cocaine: NOT DETECTED
Opiates: NOT DETECTED
Tetrahydrocannabinol: POSITIVE — AB

## 2019-12-20 LAB — URINALYSIS, ROUTINE W REFLEX MICROSCOPIC
Bilirubin Urine: NEGATIVE
Glucose, UA: NEGATIVE mg/dL
Hgb urine dipstick: NEGATIVE
Ketones, ur: NEGATIVE mg/dL
Leukocytes,Ua: NEGATIVE
Nitrite: NEGATIVE
Protein, ur: NEGATIVE mg/dL
Specific Gravity, Urine: 1.006 (ref 1.005–1.030)
pH: 7 (ref 5.0–8.0)

## 2019-12-20 LAB — SARS CORONAVIRUS 2 (TAT 6-24 HRS): SARS Coronavirus 2: NEGATIVE

## 2019-12-20 MED ORDER — GUAIFENESIN ER 600 MG PO TB12
600.0000 mg | ORAL_TABLET | Freq: Once | ORAL | Status: AC
  Administered 2019-12-20: 600 mg via ORAL
  Filled 2019-12-20: qty 1

## 2019-12-20 MED ORDER — METOCLOPRAMIDE HCL 5 MG/ML IJ SOLN
10.0000 mg | Freq: Once | INTRAMUSCULAR | Status: AC
  Administered 2019-12-20: 17:00:00 10 mg via INTRAVENOUS
  Filled 2019-12-20: qty 2

## 2019-12-20 MED ORDER — MENTHOL 3 MG MT LOZG
1.0000 | LOZENGE | OROMUCOSAL | Status: DC | PRN
  Administered 2019-12-20: 3 mg via ORAL
  Filled 2019-12-20: qty 9

## 2019-12-20 MED ORDER — DIPHENHYDRAMINE HCL 50 MG/ML IJ SOLN
25.0000 mg | Freq: Once | INTRAMUSCULAR | Status: AC
  Administered 2019-12-20: 25 mg via INTRAVENOUS
  Filled 2019-12-20: qty 1

## 2019-12-20 MED ORDER — LORATADINE 10 MG PO TABS
10.0000 mg | ORAL_TABLET | Freq: Every day | ORAL | Status: DC
  Administered 2019-12-20: 18:00:00 10 mg via ORAL
  Filled 2019-12-20: qty 1

## 2019-12-20 MED ORDER — DEXAMETHASONE SODIUM PHOSPHATE 10 MG/ML IJ SOLN
10.0000 mg | Freq: Once | INTRAMUSCULAR | Status: AC
  Administered 2019-12-20: 10 mg via INTRAVENOUS
  Filled 2019-12-20: qty 1

## 2019-12-20 MED ORDER — LACTATED RINGERS IV BOLUS
250.0000 mL | Freq: Once | INTRAVENOUS | Status: AC
  Administered 2019-12-20: 250 mL via INTRAVENOUS

## 2019-12-20 MED ORDER — GUAIFENESIN ER 600 MG PO TB12: 600.0000 mg | ORAL_TABLET | Freq: Two times a day (BID) | ORAL | Status: DC

## 2019-12-20 MED ORDER — LORATADINE 10 MG PO TABS: 10.0000 mg | ORAL_TABLET | Freq: Every day | ORAL | 0 refills | Status: DC

## 2019-12-20 MED ORDER — MUCINEX 600 MG PO TB12: 600.0000 mg | ORAL_TABLET | Freq: Two times a day (BID) | ORAL | 0 refills | Status: DC

## 2019-12-20 NOTE — MAU Note (Signed)
Pt is c/o headache, runny nose, sore throat, body aches and sinus pressure since yesterday. Denies fever. Denies contractions, VB, LOF. +FM

## 2019-12-20 NOTE — Discharge Instructions (Signed)

## 2019-12-20 NOTE — MAU Provider Note (Addendum)
History     CSN: [id number]  Arrival date and time: 12/20/19 1505   First Provider Initiated Contact with Patient 12/20/19 1553      Chief Complaint  Patient presents with  . Nasal Congestion  . Sore Throat  . Facial Pain  . Generalized Body Aches   HPI Janice Duarte is a 29 y.o. G1P0000 at 81w5dwho presents to MAU with chief complaints of sinus congestion, headache, body aches and fatigue. These are new complaints, onset yesterday 12/20/2019. Her headache is bilateral, anterior, pain score is 8/10 and pain does not radiate. Patient has not taken medication or tried other treatments for these complaints.  She denies vaginal bleeding, leaking of fluid, decreased fetal movement, fever, falls, or recent illness.   Patient receives care with [facility]  OB History    Gravida  1   Para  0   Term  0   Preterm  0   AB  0   Living  0     SAB  0   TAB  0   Ectopic  0   Multiple  0   Live Births  0           Past Medical History:  Diagnosis Date  . Abscess   . Bacterial vaginal infection   . Headaches, cluster   . Hidradenitis suppurativa    Breast, axillae, groin  . Hypertension   . Scoliosis   . Scoliosis     Past Surgical History:  Procedure Laterality Date  . WISDOM TOOTH EXTRACTION      Family History  Problem Relation Age of Onset  . Diabetes Other   . Hypertension Other   . Hypertension Mother   . Heart failure Mother   . Hepatitis B Mother   . Cirrhosis Mother   . Hypertension Paternal Aunt     Social History   Tobacco Use  . Smoking status: Never Smoker  . Smokeless tobacco: Never Used  Substance Use Topics  . Alcohol use: Not Currently    Comment: socially  . Drug use: Yes    Frequency: 7.0 times per week    Types: Marijuana    Comment: once a day    Allergies:  Allergies  Allergen Reactions  . Penicillins Hives    Has patient had a PCN reaction causing immediate rash, facial/tongue/throat swelling, SOB or  lightheadedness with hypotension: Yes Has patient had a PCN reaction causing severe rash involving mucus membranes or skin necrosis: Yes Has patient had a PCN reaction that required hospitalization No Has patient had a PCN reaction occurring within the last 10 years: Yes  If all of the above answers are "NO", then may proceed with Cephalosporin use.   .Marland KitchenMorphine And Related Rash  . Bactrim [Sulfamethoxazole-Trimethoprim] Hives    Medications Prior to Admission  Medication Sig Dispense Refill Last Dose  . acetaminophen (TYLENOL) 500 MG tablet Take 500 mg by mouth every 6 (six) hours as needed for mild pain.   12/19/2019 at Unknown time  . aspirin EC 81 MG tablet Take 1 tablet (81 mg total) by mouth daily. 100 tablet 2 12/20/2019 at Unknown time  . cyclobenzaprine (FLEXERIL) 10 MG tablet Take 1 tablet (10 mg total) by mouth every 8 (eight) hours as needed for muscle spasms. 30 tablet 1 Past Month at Unknown time  . Prenatal MV-Min-FA-Omega-3 (PRENATAL GUMMIES/DHA & FA PO) Take 2 tablets by mouth daily.   12/20/2019 at Unknown time  . terconazole (TERAZOL  7) 0.4 % vaginal cream Place 1 applicator vaginally at bedtime. 45 g 0 Past Month at Unknown time  . Blood Pressure Monitoring (BLOOD PRESSURE KIT) DEVI 1 Device by Does not apply route daily. ICD 10: Z34.00 1 each 0   . Misc. Devices MISC Dispense one maternity belt for patient 1 each 0   . ondansetron (ZOFRAN) 4 MG tablet Take by mouth.    at unknown  . traMADol (ULTRAM) 50 MG tablet Take 1-2 tablets (50-100 mg total) by mouth every 6 (six) hours as needed for severe pain. 30 tablet 0     Review of Systems  Constitutional: Positive for chills. Negative for fatigue and fever.  HENT: Positive for congestion, sinus pressure and sinus pain. Negative for trouble swallowing.   Respiratory: Negative for chest tightness and shortness of breath.   Gastrointestinal: Negative for abdominal pain, constipation, nausea and vomiting.  Genitourinary:  Negative for dysuria, vaginal bleeding, vaginal discharge and vaginal pain.  Musculoskeletal: Negative for back pain.  Neurological: Positive for headaches. Negative for dizziness.  All other systems reviewed and are negative.  Physical Exam   Blood pressure (!) 106/54, pulse 84, temperature 97.6 F (36.4 Duarte), temperature source Oral, resp. rate 18, last menstrual period 05/26/2019, SpO2 99 %, unknown if currently breastfeeding.  Physical Exam  Nursing note and vitals reviewed. Constitutional: She is oriented to person, place, and time. She appears well-developed and well-nourished.  Cardiovascular: Normal rate and normal heart sounds.  Respiratory: Effort normal and breath sounds normal. She has no decreased breath sounds.  GI: Soft. There is no abdominal tenderness. There is no CVA tenderness.  Neurological: She is alert and oriented to person, place, and time.  Skin: Skin is warm and dry.  Psychiatric: She has a normal mood and affect. Her behavior is normal. Judgment and thought content normal.    MAU Course/MDM  Procedures  --Headache resolved with IV headache cocktail --Reactive tracing: baseline 135, mod variability, positive 10 x 10 accels, no decels --Toco: quiet  Orders Placed This Encounter  Procedures  . SARS CORONAVIRUS 2 (TAT 6-24 HRS) Nasopharyngeal Nasopharyngeal Swab  . Urinalysis, Routine w reflex microscopic  . Rapid urine drug screen (hospital performed)  . Insert peripheral IV   Meds ordered this encounter  Medications  . loratadine (CLARITIN) tablet 10 mg  . menthol-cetylpyridinium (CEPACOL) lozenge 3 mg  . metoCLOPramide (REGLAN) injection 10 mg  . diphenhydrAMINE (BENADRYL) injection 25 mg  . dexamethasone (DECADRON) injection 10 mg  . lactated ringers bolus 250 mL  . guaiFENesin (MUCINEX) 12 hr tablet 600 mg  . loratadine (CLARITIN) 10 MG tablet    Sig: Take 1 tablet (10 mg total) by mouth daily.    Dispense:  30 tablet    Refill:  0    Order  Specific Question:   Supervising Provider    Answer:   CONSTANT, PEGGY [[id number]]  . guaiFENesin (MUCINEX) 600 MG 12 hr tablet    Sig: Take 1 tablet (600 mg total) by mouth 2 (two) times daily.    Dispense:  30 tablet    Refill:  0    Order Specific Question:   Supervising Provider    Answer:   CONSTANT, PEGGY [[id number]]   Assessment and Plan  --29 y.o. G1P0000 at [redacted]w[redacted]d --Reactive tracing --All complaints resolved with treatments in MAU. Given list of OTC symptom management meds --+ THC, abstinence encouraged --COVID swab pending --Discharge home in stable condition  [facility]: [facility]/2019  Janice Duarte  Jeronimo Greaves, CNM 12/20/2019, 7:29 PM

## 2019-12-28 ENCOUNTER — Other Ambulatory Visit: Payer: Self-pay

## 2019-12-28 ENCOUNTER — Ambulatory Visit (INDEPENDENT_AMBULATORY_CARE_PROVIDER_SITE_OTHER): Payer: Medicaid Other | Admitting: Family Medicine

## 2019-12-28 VITALS — BP 121/82 | HR 105 | Wt 170.0 lb

## 2019-12-28 DIAGNOSIS — J309 Allergic rhinitis, unspecified: Secondary | ICD-10-CM | POA: Insufficient documentation

## 2019-12-28 DIAGNOSIS — O0993 Supervision of high risk pregnancy, unspecified, third trimester: Secondary | ICD-10-CM

## 2019-12-28 DIAGNOSIS — O10919 Unspecified pre-existing hypertension complicating pregnancy, unspecified trimester: Secondary | ICD-10-CM

## 2019-12-28 DIAGNOSIS — J301 Allergic rhinitis due to pollen: Secondary | ICD-10-CM

## 2019-12-28 HISTORY — DX: Allergic rhinitis, unspecified: J30.9

## 2019-12-28 MED ORDER — DOCUSATE SODIUM 50 MG PO CAPS: 50.0000 mg | ORAL_CAPSULE | Freq: Two times a day (BID) | ORAL | 0 refills | Status: DC

## 2019-12-28 NOTE — Patient Instructions (Addendum)
Nasacort nose spray  Breastfeeding  Choosing to breastfeed is one of the best decisions you can make for yourself and your baby. A change in hormones during pregnancy causes your breasts to make breast milk in your milk-producing glands. Hormones prevent breast milk from being released before your baby is born. They also prompt milk flow after birth. Once breastfeeding has begun, thoughts of your baby, as well as his or her sucking or crying, can stimulate the release of milk from your milk-producing glands. Benefits of breastfeeding Research shows that breastfeeding offers many health benefits for infants and mothers. It also offers a cost-free and convenient way to feed your baby. For your baby  Your first milk (colostrum) helps your baby's digestive system to function better.  Special cells in your milk (antibodies) help your baby to fight off infections.  Breastfed babies are less likely to develop asthma, allergies, obesity, or type 2 diabetes. They are also at lower risk for sudden infant death syndrome (SIDS).  Nutrients in breast milk are better able to meet your baby's needs compared to infant formula.  Breast milk improves your baby's brain development. For you  Breastfeeding helps to create a very special bond between you and your baby.  Breastfeeding is convenient. Breast milk costs nothing and is always available at the correct temperature.  Breastfeeding helps to burn calories. It helps you to lose the weight that you gained during pregnancy.  Breastfeeding makes your uterus return faster to its size before pregnancy. It also slows bleeding (lochia) after you give birth.  Breastfeeding helps to lower your risk of developing type 2 diabetes, osteoporosis, rheumatoid arthritis, cardiovascular disease, and breast, ovarian, uterine, and endometrial cancer later in life. Breastfeeding basics Starting breastfeeding  Find a comfortable place to sit or lie down, with your neck  and back well-supported.  Place a pillow or a rolled-up blanket under your baby to bring him or her to the level of your breast (if you are seated). Nursing pillows are specially designed to help support your arms and your baby while you breastfeed.  Make sure that your baby's tummy (abdomen) is facing your abdomen.  Gently massage your breast. With your fingertips, massage from the outer edges of your breast inward toward the nipple. This encourages milk flow. If your milk flows slowly, you may need to continue this action during the feeding.  Support your breast with 4 fingers underneath and your thumb above your nipple (make the letter "C" with your hand). Make sure your fingers are well away from your nipple and your baby's mouth.  Stroke your baby's lips gently with your finger or nipple.  When your baby's mouth is open wide enough, quickly bring your baby to your breast, placing your entire nipple and as much of the areola as possible into your baby's mouth. The areola is the colored area around your nipple. ? More areola should be visible above your baby's upper lip than below the lower lip. ? Your baby's lips should be opened and extended outward (flanged) to ensure an adequate, comfortable latch. ? Your baby's tongue should be between his or her lower gum and your breast.  Make sure that your baby's mouth is correctly positioned around your nipple (latched). Your baby's lips should create a seal on your breast and be turned out (everted).  It is common for your baby to suck about 2-3 minutes in order to start the flow of breast milk. Latching Teaching your baby how to latch onto  your breast properly is very important. An improper latch can cause nipple pain, decreased milk supply, and poor weight gain in your baby. Also, if your baby is not latched onto your nipple properly, he or she may swallow some air during feeding. This can make your baby fussy. Burping your baby when you switch  breasts during the feeding can help to get rid of the air. However, teaching your baby to latch on properly is still the best way to prevent fussiness from swallowing air while breastfeeding. Signs that your baby has successfully latched onto your nipple  Silent tugging or silent sucking, without causing you pain. Infant's lips should be extended outward (flanged).  Swallowing heard between every 3-4 sucks once your milk has started to flow (after your let-down milk reflex occurs).  Muscle movement above and in front of his or her ears while sucking. Signs that your baby has not successfully latched onto your nipple  Sucking sounds or smacking sounds from your baby while breastfeeding.  Nipple pain. If you think your baby has not latched on correctly, slip your finger into the corner of your baby's mouth to break the suction and place it between your baby's gums. Attempt to start breastfeeding again. Signs of successful breastfeeding Signs from your baby  Your baby will gradually decrease the number of sucks or will completely stop sucking.  Your baby will fall asleep.  Your baby's body will relax.  Your baby will retain a small amount of milk in his or her mouth.  Your baby will let go of your breast by himself or herself. Signs from you  Breasts that have increased in firmness, weight, and size 1-3 hours after feeding.  Breasts that are softer immediately after breastfeeding.  Increased milk volume, as well as a change in milk consistency and color by the fifth day of breastfeeding.  Nipples that are not sore, cracked, or bleeding. Signs that your baby is getting enough milk  Wetting at least 1-2 diapers during the first 24 hours after birth.  Wetting at least 5-6 diapers every 24 hours for the first week after birth. The urine should be clear or pale yellow by the age of 5 days.  Wetting 6-8 diapers every 24 hours as your baby continues to grow and develop.  At least 3  stools in a 24-hour period by the age of 5 days. The stool should be soft and yellow.  At least 3 stools in a 24-hour period by the age of 7 days. The stool should be seedy and yellow.  No loss of weight greater than 10% of birth weight during the first 3 days of life.  Average weight gain of 4-7 oz (113-198 g) per week after the age of 4 days.  Consistent daily weight gain by the age of 5 days, without weight loss after the age of 2 weeks. After a feeding, your baby may spit up a small amount of milk. This is normal. Breastfeeding frequency and duration Frequent feeding will help you make more milk and can prevent sore nipples and extremely full breasts (breast engorgement). Breastfeed when you feel the need to reduce the fullness of your breasts or when your baby shows signs of hunger. This is called "breastfeeding on demand." Signs that your baby is hungry include:  Increased alertness, activity, or restlessness.  Movement of the head from side to side.  Opening of the mouth when the corner of the mouth or cheek is stroked (rooting).  Increased sucking  sounds, smacking lips, cooing, sighing, or squeaking.  Hand-to-mouth movements and sucking on fingers or hands.  Fussing or crying. Avoid introducing a pacifier to your baby in the first 4-6 weeks after your baby is born. After this time, you may choose to use a pacifier. Research has shown that pacifier use during the first year of a baby's life decreases the risk of sudden infant death syndrome (SIDS). Allow your baby to feed on each breast as long as he or she wants. When your baby unlatches or falls asleep while feeding from the first breast, offer the second breast. Because newborns are often sleepy in the first few weeks of life, you may need to awaken your baby to get him or her to feed. Breastfeeding times will vary from baby to baby. However, the following rules can serve as a guide to help you make sure that your baby is properly  fed:  Newborns (babies 38 weeks of age or younger) may breastfeed every 1-3 hours.  Newborns should not go without breastfeeding for longer than 3 hours during the day or 5 hours during the night.  You should breastfeed your baby a minimum of 8 times in a 24-hour period. Breast milk pumping     Pumping and storing breast milk allows you to make sure that your baby is exclusively fed your breast milk, even at times when you are unable to breastfeed. This is especially important if you go back to work while you are still breastfeeding, or if you are not able to be present during feedings. Your lactation consultant can help you find a method of pumping that works best for you and give you guidelines about how long it is safe to store breast milk. Caring for your breasts while you breastfeed Nipples can become dry, cracked, and sore while breastfeeding. The following recommendations can help keep your breasts moisturized and healthy:  Avoid using soap on your nipples.  Wear a supportive bra designed especially for nursing. Avoid wearing underwire-style bras or extremely tight bras (sports bras).  Air-dry your nipples for 3-4 minutes after each feeding.  Use only cotton bra pads to absorb leaked breast milk. Leaking of breast milk between feedings is normal.  Use lanolin on your nipples after breastfeeding. Lanolin helps to maintain your skin's normal moisture barrier. Pure lanolin is not harmful (not toxic) to your baby. You may also hand express a few drops of breast milk and gently massage that milk into your nipples and allow the milk to air-dry. In the first few weeks after giving birth, some women experience breast engorgement. Engorgement can make your breasts feel heavy, warm, and tender to the touch. Engorgement peaks within 3-5 days after you give birth. The following recommendations can help to ease engorgement:  Completely empty your breasts while breastfeeding or pumping. You may  want to start by applying warm, moist heat (in the shower or with warm, water-soaked hand towels) just before feeding or pumping. This increases circulation and helps the milk flow. If your baby does not completely empty your breasts while breastfeeding, pump any extra milk after he or she is finished.  Apply ice packs to your breasts immediately after breastfeeding or pumping, unless this is too uncomfortable for you. To do this: ? Put ice in a plastic bag. ? Place a towel between your skin and the bag. ? Leave the ice on for 20 minutes, 2-3 times a day.  Make sure that your baby is latched on and positioned  properly while breastfeeding. If engorgement persists after 48 hours of following these recommendations, contact your health care provider or a Science writer. Overall health care recommendations while breastfeeding  Eat 3 healthy meals and 3 snacks every day. Well-nourished mothers who are breastfeeding need an additional 450-500 calories a day. You can meet this requirement by increasing the amount of a balanced diet that you eat.  Drink enough water to keep your urine pale yellow or clear.  Rest often, relax, and continue to take your prenatal vitamins to prevent fatigue, stress, and low vitamin and mineral levels in your body (nutrient deficiencies).  Do not use any products that contain nicotine or tobacco, such as cigarettes and e-cigarettes. Your baby may be harmed by chemicals from cigarettes that pass into breast milk and exposure to secondhand smoke. If you need help quitting, ask your health care provider.  Avoid alcohol.  Do not use illegal drugs or marijuana.  Talk with your health care provider before taking any medicines. These include over-the-counter and prescription medicines as well as vitamins and herbal supplements. Some medicines that may be harmful to your baby can pass through breast milk.  It is possible to become pregnant while breastfeeding. If birth  control is desired, ask your health care provider about options that will be safe while breastfeeding your baby. Where to find more information: [employer]: www.llli.org Contact a health care provider if:  You feel like you want to stop breastfeeding or have become frustrated with breastfeeding.  Your nipples are cracked or bleeding.  Your breasts are red, tender, or warm.  You have: ? Painful breasts or nipples. ? A swollen area on either breast. ? A fever or chills. ? Nausea or vomiting. ? Drainage other than breast milk from your nipples.  Your breasts do not become full before feedings by the fifth day after you give birth.  You feel sad and depressed.  Your baby is: ? Too sleepy to eat well. ? Having trouble sleeping. ? More than 55 week old and wetting fewer than 6 diapers in a 24-hour period. ? Not gaining weight by 68 days of age.  Your baby has fewer than 3 stools in a 24-hour period.  Your baby's skin or the white parts of his or her eyes become yellow. Get help right away if:  Your baby is overly tired (lethargic) and does not want to wake up and feed.  Your baby develops an unexplained fever. Summary  Breastfeeding offers many health benefits for infant and mothers.  Try to breastfeed your infant when he or she shows early signs of hunger.  Gently tickle or stroke your baby's lips with your finger or nipple to allow the baby to open his or her mouth. Bring the baby to your breast. Make sure that much of the areola is in your baby's mouth. Offer one side and burp the baby before you offer the other side.  Talk with your health care provider or lactation consultant if you have questions or you face problems as you breastfeed. This information is not intended to replace advice given to you by your health care provider. Make sure you discuss any questions you have with your health care provider. Document Revised: 12/10/2017 Document Reviewed:  10/17/2016 Elsevier Patient Education  [employer] [employer].

## 2019-12-28 NOTE — Progress Notes (Signed)
Constipation Wakes up wheezing thinking its allergy based.

## 2019-12-28 NOTE — Progress Notes (Signed)
   PRENATAL VISIT NOTE  Subjective:  Janice Duarte is a 29 y.o. G1P0000 at [redacted]w[redacted]d being seen today for ongoing prenatal care.  She is currently monitored for the following issues for this high-risk pregnancy and has Chronic hypertension affecting pregnancy; E. coli UTI (urinary tract infection); Supervision of high-risk pregnancy; GBS (group B streptococcus) UTI complicating pregnancy; Hidradenitis suppurativa; Substance use; and Allergic rhinitis on their problem list.  Patient reports constipation and her partner says she is wheezing during sleep. Has h/o allergies. No CP, SOB or cough..  Contractions: Not present. Vag. Bleeding: None.  Movement: Present. Denies leaking of fluid.   The following portions of the patient's history were reviewed and updated as appropriate: allergies, current medications, past family history, past medical history, past social history, past surgical history and problem list.   Objective:   Vitals:   12/28/19 0911  BP: 121/82  Pulse: (!) 105  Weight: 170 lb (77.1 kg)    Fetal Status: Fetal Heart Rate (bpm): 144 Fundal Height: 29 cm Movement: Present     General:  Alert, oriented and cooperative. Patient is in no acute distress.  Skin: Skin is warm and dry. No rash noted.   Cardiovascular: Normal heart rate noted  Respiratory: Normal respiratory effort, clear bilaterally with good air movement, no wheezing  Abdomen: Soft, gravid, appropriate for gestational age.  Pain/Pressure: Present     Pelvic: Cervical exam deferred        Extremities: Normal range of motion.  Edema: None  Mental Status: Normal mood and affect. Normal behavior. Normal judgment and thought content.   Assessment and Plan:  Pregnancy: G1P0000 at [redacted]w[redacted]d 1. Chronic hypertension affecting pregnancy ON ASA, no meds, BP is well controlled.  2. Supervision of high risk pregnancy in third trimester nml 2 hour Constipation treated with colace  3. Seasonal allergic rhinitis due to pollen  Taking Claritin, add Nasacort for pollen  Preterm labor symptoms and general obstetric precautions including but not limited to vaginal bleeding, contractions, leaking of fluid and fetal movement were reviewed in detail with the patient. Please refer to After Visit Summary for other counseling recommendations.   Return in 2 weeks (on 01/11/2020) for in person cannot do virtual (will not connect).  Future Appointments  Date Time Provider Department Center  01/09/2020  8:00 AM [facility] NURSE [facility]  01/09/2020  8:00 AM [facility] [place] 3 [facility]  01/11/2020  8:15 AM Malachy Chamber, MD [facility]  01/25/2020  8:15 AM Adam Phenix, MD [facility]OC  02/08/2020  8:15 AM Reva Bores, MD [facility] WOC  02/15/2020  8:15 AM Adam Phenix, MD [facility] WOC  02/22/2020  8:15 AM Reva Bores, MD [facility] WOC    Reva Bores, MD

## 2020-01-09 ENCOUNTER — Ambulatory Visit (HOSPITAL_COMMUNITY)
Admission: RE | Admit: 2020-01-09 | Discharge: 2020-01-09 | Disposition: A | Discharge status: Home / Self Care | Payer: Medicaid Other | Source: Ambulatory Visit | Attending: Family Medicine | Admitting: Family Medicine

## 2020-01-09 ENCOUNTER — Encounter (HOSPITAL_COMMUNITY): Payer: Self-pay

## 2020-01-09 ENCOUNTER — Ambulatory Visit (HOSPITAL_COMMUNITY): Payer: Medicaid Other | Admitting: *Deleted

## 2020-01-09 ENCOUNTER — Other Ambulatory Visit (HOSPITAL_COMMUNITY): Payer: Self-pay | Admitting: *Deleted

## 2020-01-09 ENCOUNTER — Other Ambulatory Visit: Payer: Self-pay

## 2020-01-09 DIAGNOSIS — O10919 Unspecified pre-existing hypertension complicating pregnancy, unspecified trimester: Secondary | ICD-10-CM

## 2020-01-09 DIAGNOSIS — O0993 Supervision of high risk pregnancy, unspecified, third trimester: Secondary | ICD-10-CM | POA: Diagnosis present

## 2020-01-09 DIAGNOSIS — O10913 Unspecified pre-existing hypertension complicating pregnancy, third trimester: Secondary | ICD-10-CM

## 2020-01-09 DIAGNOSIS — Z148 Genetic carrier of other disease: Secondary | ICD-10-CM

## 2020-01-09 DIAGNOSIS — Z3A32 32 weeks gestation of pregnancy: Secondary | ICD-10-CM

## 2020-01-09 DIAGNOSIS — Z362 Encounter for other antenatal screening follow-up: Secondary | ICD-10-CM | POA: Diagnosis not present

## 2020-01-11 ENCOUNTER — Other Ambulatory Visit: Payer: Self-pay

## 2020-01-11 ENCOUNTER — Other Ambulatory Visit (HOSPITAL_COMMUNITY)
Admission: RE | Admit: 2020-01-11 | Discharge: 2020-01-11 | Disposition: A | Discharge status: Home / Self Care | Payer: Medicaid Other | Source: Ambulatory Visit | Attending: Obstetrics & Gynecology | Admitting: Obstetrics & Gynecology

## 2020-01-11 ENCOUNTER — Ambulatory Visit (INDEPENDENT_AMBULATORY_CARE_PROVIDER_SITE_OTHER): Payer: Medicaid Other | Admitting: Obstetrics & Gynecology

## 2020-01-11 VITALS — BP 133/83 | HR 81 | Wt 169.7 lb

## 2020-01-11 DIAGNOSIS — O0993 Supervision of high risk pregnancy, unspecified, third trimester: Secondary | ICD-10-CM | POA: Diagnosis not present

## 2020-01-11 DIAGNOSIS — O98813 Other maternal infectious and parasitic diseases complicating pregnancy, third trimester: Secondary | ICD-10-CM

## 2020-01-11 DIAGNOSIS — O10013 Pre-existing essential hypertension complicating pregnancy, third trimester: Secondary | ICD-10-CM

## 2020-01-11 DIAGNOSIS — O2393 Unspecified genitourinary tract infection in pregnancy, third trimester: Secondary | ICD-10-CM

## 2020-01-11 DIAGNOSIS — Z3A32 32 weeks gestation of pregnancy: Secondary | ICD-10-CM

## 2020-01-11 NOTE — Progress Notes (Signed)
   PRENATAL VISIT NOTE  Subjective:  Janice Duarte is a 29 y.o. G1P0000 at [redacted]w[redacted]d being seen today for ongoing prenatal care.  She is currently monitored for the following issues for this high-risk pregnancy and has Chronic hypertension affecting pregnancy; E. coli UTI (urinary tract infection); Supervision of high-risk pregnancy; GBS (group B streptococcus) UTI complicating pregnancy; Hidradenitis suppurativa; Substance use; and Allergic rhinitis on their problem list.  Patient reports vaginal irritation and discharge, thin white over the past few days. .  Contractions: Not present. Vag. Bleeding: None.  Movement: Present. Denies leaking of fluid.   The following portions of the patient's history were reviewed and updated as appropriate: allergies, current medications, past family history, past medical history, past social history, past surgical history and problem list.   Objective:   Vitals:   01/11/20 0815  Weight: 77 kg    Fetal Status:     Movement: Present     General:  Alert, oriented and cooperative. Patient is in no acute distress.  Skin: Skin is warm and dry. No rash noted.   Cardiovascular: Normal heart rate noted  Respiratory: Normal respiratory effort, no problems with respiration noted  Abdomen: Soft, gravid, appropriate for gestational age.  Pain/Pressure: Absent     Pelvic: Cervical exam deferred      Thin white discharge w/o odor noted.   Extremities: Normal range of motion.  Edema: None  Mental Status: Normal mood and affect. Normal behavior. Normal judgment and thought content.   Assessment and Plan:  Pregnancy: G1P0000 at [redacted]w[redacted]d 1. Supervision of high risk pregnancy in third trimester Mother doing well. Cont. Labetalol for cHTN, asx.  Vaginal swab sent for discharge, likely physiologic. Will treat pending result.  - Cervicovaginal ancillary only( [place])  Preterm labor symptoms and general obstetric precautions including but not limited to vaginal  bleeding, contractions, leaking of fluid and fetal movement were reviewed in detail with the patient. Please refer to After Visit Summary for other counseling recommendations.   No follow-ups on file.  Future Appointments  Date Time Provider Department Center  01/25/2020  8:15 AM Adam Phenix, MD [facility]  02/07/2020  8:00 AM [facility] NURSE [facility]  02/07/2020  8:00 AM [facility] [place] 1 [facility] MFC-US  02/08/2020  8:15 AM Reva Bores, MD [facility] WOC  02/15/2020  8:15 AM Adam Phenix, MD [facility] WOC  02/22/2020  8:15 AM Reva Bores, MD [facility] WOC    Malachy Chamber, MD

## 2020-01-12 LAB — CERVICOVAGINAL ANCILLARY ONLY
Bacterial Vaginitis (gardnerella): POSITIVE — AB
Candida Glabrata: NEGATIVE
Candida Vaginitis: NEGATIVE
Comment: NEGATIVE
Comment: NEGATIVE
Comment: NEGATIVE

## 2020-01-13 ENCOUNTER — Telehealth: Payer: Self-pay | Admitting: Family Medicine

## 2020-01-13 ENCOUNTER — Other Ambulatory Visit: Payer: Self-pay

## 2020-01-13 MED ORDER — METRONIDAZOLE 500 MG PO TABS: 500.0000 mg | ORAL_TABLET | Freq: Two times a day (BID) | ORAL | 0 refills | Status: DC

## 2020-01-13 NOTE — Telephone Encounter (Signed)
Patient called in wanting to know if her medication was sent it yet. Patient stated that she got some results back yesterday and wanted to know what was needed. Patient instructed that a message will be sent to the nurses and they will contact her as soon as possible. Patient verbalized understanding and message sent to clinical pool

## 2020-01-13 NOTE — Telephone Encounter (Signed)
Pt concern addressed via MyChart.

## 2020-01-18 ENCOUNTER — Other Ambulatory Visit: Payer: Self-pay

## 2020-01-18 ENCOUNTER — Inpatient Hospital Stay (HOSPITAL_COMMUNITY)
Admission: AD | Admit: 2020-01-18 | Discharge: 2020-01-18 | Disposition: A | Discharge status: Home / Self Care | Payer: Medicaid Other | Attending: Family Medicine | Admitting: Family Medicine

## 2020-01-18 ENCOUNTER — Encounter (HOSPITAL_COMMUNITY): Payer: Self-pay | Admitting: Family Medicine

## 2020-01-18 ENCOUNTER — Inpatient Hospital Stay (EMERGENCY_DEPARTMENT_HOSPITAL)
Admission: AD | Admit: 2020-01-18 | Discharge: 2020-01-19 | Disposition: A | Discharge status: Home / Self Care | Payer: Medicaid Other | Source: Home / Self Care | Attending: Obstetrics & Gynecology | Admitting: Obstetrics & Gynecology

## 2020-01-18 DIAGNOSIS — M419 Scoliosis, unspecified: Secondary | ICD-10-CM | POA: Insufficient documentation

## 2020-01-18 DIAGNOSIS — O10913 Unspecified pre-existing hypertension complicating pregnancy, third trimester: Secondary | ICD-10-CM

## 2020-01-18 DIAGNOSIS — Z885 Allergy status to narcotic agent status: Secondary | ICD-10-CM | POA: Insufficient documentation

## 2020-01-18 DIAGNOSIS — R109 Unspecified abdominal pain: Secondary | ICD-10-CM | POA: Diagnosis present

## 2020-01-18 DIAGNOSIS — O10013 Pre-existing essential hypertension complicating pregnancy, third trimester: Secondary | ICD-10-CM | POA: Insufficient documentation

## 2020-01-18 DIAGNOSIS — Z880 Allergy status to penicillin: Secondary | ICD-10-CM | POA: Insufficient documentation

## 2020-01-18 DIAGNOSIS — R519 Headache, unspecified: Secondary | ICD-10-CM | POA: Insufficient documentation

## 2020-01-18 DIAGNOSIS — M549 Dorsalgia, unspecified: Secondary | ICD-10-CM

## 2020-01-18 DIAGNOSIS — Z881 Allergy status to other antibiotic agents status: Secondary | ICD-10-CM | POA: Insufficient documentation

## 2020-01-18 DIAGNOSIS — O479 False labor, unspecified: Secondary | ICD-10-CM

## 2020-01-18 DIAGNOSIS — O4700 False labor before 37 completed weeks of gestation, unspecified trimester: Secondary | ICD-10-CM

## 2020-01-18 DIAGNOSIS — O26893 Other specified pregnancy related conditions, third trimester: Secondary | ICD-10-CM | POA: Insufficient documentation

## 2020-01-18 DIAGNOSIS — O0993 Supervision of high risk pregnancy, unspecified, third trimester: Secondary | ICD-10-CM

## 2020-01-18 DIAGNOSIS — Z3A34 34 weeks gestation of pregnancy: Secondary | ICD-10-CM | POA: Insufficient documentation

## 2020-01-18 DIAGNOSIS — Z3A33 33 weeks gestation of pregnancy: Secondary | ICD-10-CM | POA: Diagnosis not present

## 2020-01-18 DIAGNOSIS — O99891 Other specified diseases and conditions complicating pregnancy: Secondary | ICD-10-CM

## 2020-01-18 DIAGNOSIS — O10919 Unspecified pre-existing hypertension complicating pregnancy, unspecified trimester: Secondary | ICD-10-CM

## 2020-01-18 LAB — URINALYSIS, ROUTINE W REFLEX MICROSCOPIC
Bilirubin Urine: NEGATIVE
Glucose, UA: NEGATIVE mg/dL
Hgb urine dipstick: NEGATIVE
Ketones, ur: NEGATIVE mg/dL
Nitrite: NEGATIVE
Protein, ur: NEGATIVE mg/dL
Specific Gravity, Urine: 1.003 — ABNORMAL LOW (ref 1.005–1.030)
pH: 7 (ref 5.0–8.0)

## 2020-01-18 LAB — GC/CHLAMYDIA PROBE AMP (~~LOC~~) NOT AT ARMC
Chlamydia: NEGATIVE
Comment: NEGATIVE
Comment: NORMAL
Neisseria Gonorrhea: NEGATIVE

## 2020-01-18 LAB — WET PREP, GENITAL
Clue Cells Wet Prep HPF POC: NONE SEEN
Sperm: NONE SEEN
Trich, Wet Prep: NONE SEEN
Yeast Wet Prep HPF POC: NONE SEEN

## 2020-01-18 LAB — FETAL FIBRONECTIN: Fetal Fibronectin: NEGATIVE

## 2020-01-18 MED ORDER — NIFEDIPINE 10 MG PO CAPS
10.0000 mg | ORAL_CAPSULE | ORAL | Status: AC | PRN
  Administered 2020-01-18 (×3): 10 mg via ORAL
  Filled 2020-01-18 (×3): qty 1

## 2020-01-18 NOTE — MAU Provider Note (Addendum)
History  Chief Complaint:  Abdominal Pain  Janice Duarte is a 29 y.o. G1P0000 female at 75w6dpresenting w/ report of contractions since the weekend, getting worse. Feels in lower abd and lower back. Hurts to walk. Rates 9/10. Drinks a lot of water. Taking metronidazole for BV, started on Sunday. Denies abnormal discharge, itching/odor/irritation.  No uti sx. No vb/lof.  Reports active fetal movement, contractions: regular, vaginal bleeding: none, membranes: intact. Denies uti s/s, abnormal/malodorous vag d/c or vulvovaginal itching/irritation.   [facility]  Next visit 4/28. [facility]  Obstetrical History: OB History    Gravida  1   Para  0   Term  0   Preterm  0   AB  0   Living  0     SAB  0   TAB  0   Ectopic  0   Multiple  0   Live Births  0           Past Medical History: Past Medical History:  Diagnosis Date  . Abscess   . Bacterial vaginal infection   . Headaches, cluster   . Hidradenitis suppurativa    Breast, axillae, groin  . Hypertension   . Scoliosis   . Scoliosis     Past Surgical History: Past Surgical History:  Procedure Laterality Date  . WISDOM TOOTH EXTRACTION      Social History: Social History   Socioeconomic History  . Marital status: Single    Spouse name: Not on file  . Number of children: Not on file  . Years of education: Not on file  . Highest education level: Not on file  Occupational History  . Not on file  Tobacco Use  . Smoking status: Never Smoker  . Smokeless tobacco: Never Used  Substance and Sexual Activity  . Alcohol use: Not Currently    Comment: socially  . Drug use: Yes    Frequency: 7.0 times per week    Types: Marijuana    Comment: LAST SMOKED 12-29-19  . Sexual activity: Yes    Birth control/protection: None  Other Topics Concern  . Not on file  Social History Narrative  . Not on file   Social Determinants of Health   Financial Resource  Strain:   . Difficulty of Paying Living Expenses:   Food Insecurity: No Food Insecurity  . Worried About RCharity fundraiserin the Last Year: Never true  . Ran Out of Food in the Last Year: Never true  Transportation Needs: No Transportation Needs  . Lack of Transportation (Medical): No  . Lack of Transportation (Non-Medical): No  Physical Activity:   . Days of Exercise per Week:   . Minutes of Exercise per Session:   Stress:   . Feeling of Stress :   Social Connections:   . Frequency of Communication with Friends and Family:   . Frequency of Social Gatherings with Friends and Family:   . Attends Religious Services:   . Active Member of Clubs or Organizations:   . Attends CArchivistMeetings:   .Marland KitchenMarital Status:     Allergies: Allergies  Allergen Reactions  . Penicillins Hives    Has patient had a PCN reaction causing immediate rash, facial/tongue/throat swelling, SOB or lightheadedness with hypotension: Yes Has patient had a PCN reaction causing severe rash involving mucus membranes or skin necrosis: Yes Has patient had a PCN reaction that required hospitalization No Has patient had a  PCN reaction occurring within the last 10 years: Yes  If all of the above answers are "NO", then may proceed with Cephalosporin use.   Marland Kitchen Morphine And Related Rash  . Bactrim [Sulfamethoxazole-Trimethoprim] Hives    Medications Prior to Admission  Medication Sig Dispense Refill Last Dose  . aspirin EC 81 MG tablet Take 1 tablet (81 mg total) by mouth daily. 100 tablet 2 01/17/2020 at Unknown time  . metroNIDAZOLE (FLAGYL) 500 MG tablet Take 1 tablet (500 mg total) by mouth 2 (two) times daily. 14 tablet 0 01/17/2020 at Unknown time  . Prenatal MV-Min-FA-Omega-3 (PRENATAL GUMMIES/DHA & FA PO) Take 2 tablets by mouth daily.   01/17/2020 at Unknown time  . acetaminophen (TYLENOL) 500 MG tablet Take 500 mg by mouth every 6 (six) hours as needed for mild pain.     . Blood Pressure Monitoring  (BLOOD PRESSURE KIT) DEVI 1 Device by Does not apply route daily. ICD 10: Z34.00 1 each 0   . cyclobenzaprine (FLEXERIL) 10 MG tablet Take 1 tablet (10 mg total) by mouth every 8 (eight) hours as needed for muscle spasms. (Patient not taking: Reported on 01/09/2020) 30 tablet 1   . docusate sodium (COLACE) 50 MG capsule Take 1 capsule (50 mg total) by mouth 2 (two) times daily. (Patient not taking: Reported on 01/09/2020) 10 capsule 0   . guaiFENesin (MUCINEX) 600 MG 12 hr tablet Take 1 tablet (600 mg total) by mouth 2 (two) times daily. (Patient not taking: Reported on 01/09/2020) 30 tablet 0   . loratadine (CLARITIN) 10 MG tablet Take 1 tablet (10 mg total) by mouth daily. 30 tablet 0   . Misc. Devices MISC Dispense one maternity belt for patient 1 each 0   . ondansetron (ZOFRAN) 4 MG tablet Take by mouth.       Review of Systems  Pertinent pos/neg as indicated in HPI  Physical Exam  Blood pressure 133/85, temperature 98.6 F (37 C), resp. rate 20, height '5\' 5"'$  (1.651 m), weight 78.9 kg, last menstrual period 05/26/2019, unknown if currently breastfeeding. General appearance: alert, cooperative and mild distress, appears she is in pain Lungs: clear to auscultation bilaterally, normal effort Heart: regular rate and rhythm Abdomen: gravid, soft, non-tender Extremities: tr edema  Spec exam: cx visually closed, scant amt white nonodorous d/c Cultures/Specimens: wet prep, gc/ct, fFN  SVE: outer os 2, inner os closed/th/-2 Presentation: unsure  Fetal monitoring: FHR: 135 bpm, variability: moderate,  Accelerations: Present,  decelerations:  Absent Uterine activity: q 2-6  MAU Course  Spec exam SVE Procardia x3>pain 'much better',   Labs:  No results found for this or any previous visit (from the past 24 hour(s)).  Imaging:  none Assessment and Plan  A:  75w6dSIUP  G1P0000  Preterm contractions w/o cervical change, -fFN  Cat 1 FHR P:  D/C home  Reviewed ptl s/s, fkc, reasons  to seek care  [facility]   KRoma SchanzCNM,WHNP-BC 4/21/202112:49 AM

## 2020-01-18 NOTE — Discharge Instructions (Signed)
Preterm Labor and Birth Information ° °The normal length of a pregnancy is 39-41 weeks. Preterm labor is when labor starts before 37 completed weeks of pregnancy. °What are the risk factors for preterm labor? °Preterm labor is more likely to occur in women who: °· Have certain infections during pregnancy such as a bladder infection, sexually transmitted infection, or infection inside the uterus (chorioamnionitis). °· Have a shorter-than-normal cervix. °· Have gone into preterm labor before. °· Have had surgery on their cervix. °· Are younger than age 28 or older than age 35. °· Are African American. °· Are pregnant with twins or multiple babies (multiple gestation). °· Take street drugs or smoke while pregnant. °· Do not gain enough weight while pregnant. °· Became pregnant shortly after having been pregnant. °What are the symptoms of preterm labor? °Symptoms of preterm labor include: °· Cramps similar to those that can happen during a menstrual period. The cramps may happen with diarrhea. °· Pain in the abdomen or lower back. °· Regular uterine contractions that may feel like tightening of the abdomen. °· A feeling of increased pressure in the pelvis. °· Increased watery or bloody mucus discharge from the vagina. °· Water breaking (ruptured amniotic sac). °Why is it important to recognize signs of preterm labor? °It is important to recognize signs of preterm labor because babies who are born prematurely may not be fully developed. This can put them at an increased risk for: °· Long-term (chronic) heart and lung problems. °· Difficulty immediately after birth with regulating body systems, including blood sugar, body temperature, heart rate, and breathing rate. °· Bleeding in the brain. °· Cerebral palsy. °· Learning difficulties. °· Death. °These risks are highest for babies who are born before 34 weeks of pregnancy. °How is preterm labor treated? °Treatment depends on the length of your pregnancy, your condition,  and the health of your baby. It may involve: °· Having a stitch (suture) placed in your cervix to prevent your cervix from opening too early (cerclage). °· Taking or being given medicines, such as: °? Hormone medicines. These may be given early in pregnancy to help support the pregnancy. °? Medicine to stop contractions. °? Medicines to help mature the baby’s lungs. These may be prescribed if the risk of delivery is high. °? Medicines to prevent your baby from developing cerebral palsy. °If the labor happens before 34 weeks of pregnancy, you may need to stay in the hospital. °What should I do if I think I am in preterm labor? °If you think that you are going into preterm labor, call your health care provider right away. °How can I prevent preterm labor in future pregnancies? °To increase your chance of having a full-term pregnancy: °· Do not use any tobacco products, such as cigarettes, chewing tobacco, and e-cigarettes. If you need help quitting, ask your health care provider. °· Do not use street drugs or medicines that have not been prescribed to you during your pregnancy. °· Talk with your health care provider before taking any herbal supplements, even if you have been taking them regularly. °· Make sure you gain a healthy amount of weight during your pregnancy. °· Watch for infection. If you think that you might have an infection, get it checked right away. °· Make sure to tell your health care provider if you have gone into preterm labor before. °This information is not intended to replace advice given to you by your health care provider. Make sure you discuss any questions you have with your   health care provider. °Document Revised: 01/07/2019 Document Reviewed: 02/06/2016 °Elsevier Patient Education © 2020 [employer] Inc[employer] ° °

## 2020-01-18 NOTE — MAU Note (Signed)
Patient reports contractions that have gotten more intense, tried taking a warm bath w/o relief.  Also states she has had a HA since receiving procardia last night.  Denies LOF/VB.  +FM.

## 2020-01-19 ENCOUNTER — Encounter (HOSPITAL_COMMUNITY): Payer: Self-pay | Admitting: Obstetrics & Gynecology

## 2020-01-19 DIAGNOSIS — M549 Dorsalgia, unspecified: Secondary | ICD-10-CM

## 2020-01-19 DIAGNOSIS — O99891 Other specified diseases and conditions complicating pregnancy: Secondary | ICD-10-CM

## 2020-01-19 DIAGNOSIS — Z3A34 34 weeks gestation of pregnancy: Secondary | ICD-10-CM

## 2020-01-19 LAB — URINALYSIS, ROUTINE W REFLEX MICROSCOPIC
Bacteria, UA: NONE SEEN
Bilirubin Urine: NEGATIVE
Glucose, UA: NEGATIVE mg/dL
Hgb urine dipstick: NEGATIVE
Ketones, ur: NEGATIVE mg/dL
Nitrite: NEGATIVE
Protein, ur: NEGATIVE mg/dL
Specific Gravity, Urine: 1.013 (ref 1.005–1.030)
pH: 7 (ref 5.0–8.0)

## 2020-01-19 MED ORDER — OXYCODONE-ACETAMINOPHEN 5-325 MG PO TABS
2.0000 | ORAL_TABLET | Freq: Once | ORAL | Status: AC
  Administered 2020-01-19: 2 via ORAL
  Filled 2020-01-19: qty 2

## 2020-01-19 NOTE — MAU Provider Note (Signed)
History     CSN: [id number]  Arrival date and time: 01/18/20 2251   None     Chief Complaint  Patient presents with  . Headache  . Contractions   HPI:  [name] is a 29yo G1 @ 34.0wks who presents for eval of ctx. States she is having approx 3/hr and that they are mostly in her back. She also has a hx of scoliosis. She was evaluated in MAU last PM for the same complaint (cx ext os 2cm/int os closed/thick/-2). Received Procardia for ctx- states that gave her a H/A that hasn't gone away. Denies leaking or bldg. Her preg has been followed by the [place] office and has been remarkable for:  # cHTN (BASA, no antihypertensives) # scoliosis # GBS bacteriuria # +THC  OB History    Gravida  1   Para  0   Term  0   Preterm  0   AB  0   Living  0     SAB  0   TAB  0   Ectopic  0   Multiple  0   Live Births  0           Past Medical History:  Diagnosis Date  . Abscess   . Bacterial vaginal infection   . Headaches, cluster   . Hidradenitis suppurativa    Breast, axillae, groin  . Hypertension   . Scoliosis   . Scoliosis     Past Surgical History:  Procedure Laterality Date  . WISDOM TOOTH EXTRACTION      Family History  Problem Relation Age of Onset  . Diabetes Other   . Hypertension Other   . Hypertension Mother   . Heart failure Mother   . Hepatitis B Mother   . Cirrhosis Mother   . Hypertension Paternal Aunt     Social History   Tobacco Use  . Smoking status: Never Smoker  . Smokeless tobacco: Never Used  Substance Use Topics  . Alcohol use: Not Currently    Comment: socially  . Drug use: Not Currently    Frequency: 7.0 times per week    Types: Marijuana    Comment: LAST SMOKED 12-29-19    Allergies:  Allergies  Allergen Reactions  . Penicillins Hives    Has patient had a PCN reaction causing immediate rash, facial/tongue/throat swelling, SOB or lightheadedness with hypotension: Yes Has patient had a PCN reaction causing severe  rash involving mucus membranes or skin necrosis: Yes Has patient had a PCN reaction that required hospitalization No Has patient had a PCN reaction occurring within the last 10 years: Yes  If all of the above answers are "NO", then may proceed with Cephalosporin use.   Marland Kitchen Morphine And Related Rash  . Bactrim [Sulfamethoxazole-Trimethoprim] Hives    Medications Prior to Admission  Medication Sig Dispense Refill Last Dose  . aspirin EC 81 MG tablet Take 1 tablet (81 mg total) by mouth daily. 100 tablet 2 01/18/2020 at Unknown time  . metroNIDAZOLE (FLAGYL) 500 MG tablet Take 1 tablet (500 mg total) by mouth 2 (two) times daily. 14 tablet 0 01/18/2020 at Unknown time  . Prenatal MV-Min-FA-Omega-3 (PRENATAL GUMMIES/DHA & FA PO) Take 2 tablets by mouth daily.   01/18/2020 at Unknown time  . acetaminophen (TYLENOL) 500 MG tablet Take 500 mg by mouth every 6 (six) hours as needed for mild pain.   Unknown at Unknown time  . Blood Pressure Monitoring (BLOOD PRESSURE KIT) DEVI 1 Device by  Does not apply route daily. ICD 10: Z34.00 1 each 0 Unknown at Unknown time  . cyclobenzaprine (FLEXERIL) 10 MG tablet Take 1 tablet (10 mg total) by mouth every 8 (eight) hours as needed for muscle spasms. (Patient not taking: Reported on 01/09/2020) 30 tablet 1   . docusate sodium (COLACE) 50 MG capsule Take 1 capsule (50 mg total) by mouth 2 (two) times daily. (Patient not taking: Reported on 01/09/2020) 10 capsule 0   . guaiFENesin (MUCINEX) 600 MG 12 hr tablet Take 1 tablet (600 mg total) by mouth 2 (two) times daily. (Patient not taking: Reported on 01/09/2020) 30 tablet 0   . loratadine (CLARITIN) 10 MG tablet Take 1 tablet (10 mg total) by mouth daily. 30 tablet 0   . Misc. Devices MISC Dispense one maternity belt for patient 1 each 0   . ondansetron (ZOFRAN) 4 MG tablet Take by mouth.   Unknown at Unknown time    Review of Systems No other pertinents other than what is listed in HPI Physical Exam   Blood pressure  131/85, pulse 75, temperature 98.8 F (37.1 C), resp. rate 19, weight 79.2 kg, last menstrual period 05/26/2019, unknown if currently breastfeeding.  Physical Exam  Constitutional: She is oriented to person, place, and time. She appears well-developed.  HENT:  Head: Normocephalic.  Cardiovascular: Normal rate.  Respiratory: Effort normal.  GI:  EFM 125-130s, +accels, no decels Ctx irreg  Genitourinary:    Vagina normal.     Genitourinary Comments: Cx ext os 1+/int os closed/thick/-2   Musculoskeletal:     Cervical back: Normal range of motion.  Neurological: She is alert and oriented to person, place, and time.  Skin: Skin is warm and dry.  Psychiatric: She has a normal mood and affect. Her behavior is normal. Thought content normal.   Urinalysis    Component Value Date/Time   COLORURINE YELLOW 01/18/2020 [place] 01/18/2020 2302   LABSPEC 1.013 01/18/2020 2302   PHURINE 7.0 01/18/2020 2302   GLUCOSEU NEGATIVE 01/18/2020 2302   HGBUR NEGATIVE 01/18/2020 2302   BILIRUBINUR NEGATIVE 01/18/2020 2302   KETONESUR NEGATIVE 01/18/2020 2302   PROTEINUR NEGATIVE 01/18/2020 2302   UROBILINOGEN 0.2 10/03/2019 1704   NITRITE NEGATIVE 01/18/2020 2302   LEUKOCYTESUR TRACE (A) 01/18/2020 2302    MAU Course  Procedures  Percocet #2: after 30-40 mins states H/A is gone and back pain isn't gone but is improved- she is ready to go home Assessment and Plan  IUP'@34'$ .0wks Ctx vs back pain H/A  Will try Percocet #2 for H/A and back pain Comfort tips given (already has maternity belt- isn't helping) Keep next OB appt on 01/25/20   Myrtis Ser CNM 01/19/2020, 12:44 AM

## 2020-01-22 ENCOUNTER — Other Ambulatory Visit: Payer: Self-pay

## 2020-01-22 ENCOUNTER — Encounter (HOSPITAL_COMMUNITY): Payer: Self-pay | Admitting: Obstetrics and Gynecology

## 2020-01-22 ENCOUNTER — Inpatient Hospital Stay (HOSPITAL_COMMUNITY)
Admission: AD | Admit: 2020-01-22 | Discharge: 2020-01-22 | Disposition: A | Discharge status: Home / Self Care | Payer: Medicaid Other | Attending: Obstetrics and Gynecology | Admitting: Obstetrics and Gynecology

## 2020-01-22 DIAGNOSIS — O10913 Unspecified pre-existing hypertension complicating pregnancy, third trimester: Secondary | ICD-10-CM

## 2020-01-22 DIAGNOSIS — Z3A34 34 weeks gestation of pregnancy: Secondary | ICD-10-CM

## 2020-01-22 DIAGNOSIS — Z8249 Family history of ischemic heart disease and other diseases of the circulatory system: Secondary | ICD-10-CM | POA: Insufficient documentation

## 2020-01-22 DIAGNOSIS — Z3689 Encounter for other specified antenatal screening: Secondary | ICD-10-CM

## 2020-01-22 DIAGNOSIS — Z885 Allergy status to narcotic agent status: Secondary | ICD-10-CM | POA: Insufficient documentation

## 2020-01-22 DIAGNOSIS — O26893 Other specified pregnancy related conditions, third trimester: Secondary | ICD-10-CM

## 2020-01-22 DIAGNOSIS — R102 Pelvic and perineal pain: Secondary | ICD-10-CM | POA: Insufficient documentation

## 2020-01-22 DIAGNOSIS — Z880 Allergy status to penicillin: Secondary | ICD-10-CM | POA: Insufficient documentation

## 2020-01-22 DIAGNOSIS — R519 Headache, unspecified: Secondary | ICD-10-CM | POA: Insufficient documentation

## 2020-01-22 DIAGNOSIS — O368130 Decreased fetal movements, third trimester, not applicable or unspecified: Secondary | ICD-10-CM | POA: Diagnosis not present

## 2020-01-22 DIAGNOSIS — Z881 Allergy status to other antibiotic agents status: Secondary | ICD-10-CM | POA: Diagnosis not present

## 2020-01-22 LAB — URINALYSIS, ROUTINE W REFLEX MICROSCOPIC
Bilirubin Urine: NEGATIVE
Glucose, UA: 150 mg/dL — AB
Hgb urine dipstick: NEGATIVE
Ketones, ur: NEGATIVE mg/dL
Nitrite: NEGATIVE
Protein, ur: NEGATIVE mg/dL
Specific Gravity, Urine: 1.012 (ref 1.005–1.030)
pH: 7 (ref 5.0–8.0)

## 2020-01-22 LAB — WET PREP, GENITAL
Clue Cells Wet Prep HPF POC: NONE SEEN
Sperm: NONE SEEN
Trich, Wet Prep: NONE SEEN
Yeast Wet Prep HPF POC: NONE SEEN

## 2020-01-22 MED ORDER — DEXAMETHASONE SODIUM PHOSPHATE 10 MG/ML IJ SOLN
10.0000 mg | Freq: Once | INTRAMUSCULAR | Status: AC
  Administered 2020-01-22: 10 mg via INTRAVENOUS
  Filled 2020-01-22: qty 1

## 2020-01-22 MED ORDER — DIPHENHYDRAMINE HCL 50 MG/ML IJ SOLN
25.0000 mg | Freq: Once | INTRAMUSCULAR | Status: AC
  Administered 2020-01-22: 25 mg via INTRAVENOUS
  Filled 2020-01-22: qty 1

## 2020-01-22 MED ORDER — METOCLOPRAMIDE HCL 5 MG/ML IJ SOLN
10.0000 mg | Freq: Once | INTRAMUSCULAR | Status: AC
  Administered 2020-01-22: 21:00:00 10 mg via INTRAVENOUS
  Filled 2020-01-22: qty 2

## 2020-01-22 MED ORDER — TERCONAZOLE 0.4 % VA CREA
1.0000 | TOPICAL_CREAM | Freq: Every day | VAGINAL | 0 refills | Status: DC
Start: 2020-01-22 — End: 2020-02-06

## 2020-01-22 MED ORDER — LACTATED RINGERS IV BOLUS
1000.0000 mL | Freq: Once | INTRAVENOUS | Status: AC
  Administered 2020-01-22: 21:00:00 1000 mL via INTRAVENOUS

## 2020-01-22 NOTE — MAU Note (Signed)
Pt here with reports of lower abdominal pain and pelvic pain that started last night. Was recently seen earlier this week for contractions. Reports she was unable to sleep last night. Reports decreased fetal movement as well. Last felt baby move 2 hours ago. Denies vaginal bleeding or LOF.

## 2020-01-22 NOTE — Discharge Instructions (Signed)

## 2020-01-22 NOTE — MAU Provider Note (Signed)
History     CSN: [id number]  Arrival date and time: 01/22/20 1943   First Provider Initiated Contact with Patient 01/22/20 2017      Chief Complaint  Patient presents with  . Decreased Fetal Movement  . Pelvic Pain   HPI Janice Duarte is a 29 y.o. G1P0000 at 90w3dwho presents to MAU with multiple complaints (listed in patient's stated order of priority). She denies contractions, vaginal bleeding, leaking of fluid, fever, falls, or recent illness.   Headache This is a recurrent problem. Current headache began this afternoon. Pain score is 6-7/10. Pain is anterior "behind my eyes", does not radiate. Patient recently took Tylenol but did not experience relief.  Pelvic pain This is a recurrent problem. Patient endorses bilateral groin pain as well as pain in the middle of her pelvis. Pain worsens with prolonged walking and repositioning. She denies aggravating or alleviating factors. Patient has a maternity belt but does not find it to be helpful.  Decreased Fetal Movement This is a new problem, onset two hours prior to patient's arrival to MAU. Patient states she is unaware of interventions to trigger fetal movement because typically her baby is constantly moving. She endorses fetal movement upon sign-in to MAU registration and throughout evaluation in MAU.  [facility]  OB History    Gravida  1   Para  0   Term  0   Preterm  0   AB  0   Living  0     SAB  0   TAB  0   Ectopic  0   Multiple  0   Live Births  0           Past Medical History:  Diagnosis Date  . Abscess   . Bacterial vaginal infection   . Headaches, cluster   . Hidradenitis suppurativa    Breast, axillae, groin  . Hypertension   . Scoliosis   . Scoliosis     Past Surgical History:  Procedure Laterality Date  . WISDOM TOOTH EXTRACTION      Family History  Problem Relation Age of Onset  . Diabetes Other    . Hypertension Other   . Hypertension Mother   . Heart failure Mother   . Hepatitis B Mother   . Cirrhosis Mother   . Hypertension Paternal Aunt     Social History   Tobacco Use  . Smoking status: Never Smoker  . Smokeless tobacco: Never Used  Substance Use Topics  . Alcohol use: Not Currently    Comment: socially  . Drug use: Not Currently    Frequency: 7.0 times per week    Types: Marijuana    Comment: LAST SMOKED 12-29-19    Allergies:  Allergies  Allergen Reactions  . Penicillins Hives    Has patient had a PCN reaction causing immediate rash, facial/tongue/throat swelling, SOB or lightheadedness with hypotension: Yes Has patient had a PCN reaction causing severe rash involving mucus membranes or skin necrosis: Yes Has patient had a PCN reaction that required hospitalization No Has patient had a PCN reaction occurring within the last 10 years: Yes  If all of the above answers are "NO", then may proceed with Cephalosporin use.   .Marland KitchenMorphine And Related Rash  . Bactrim [Sulfamethoxazole-Trimethoprim] Hives    Medications Prior to Admission  Medication Sig Dispense Refill Last Dose  . aspirin EC 81 MG tablet Take 1 tablet (81  mg total) by mouth daily. 100 tablet 2 01/21/2020 at Unknown time  . Prenatal MV-Min-FA-Omega-3 (PRENATAL GUMMIES/DHA & FA PO) Take 2 tablets by mouth daily.   01/22/2020 at Unknown time  . acetaminophen (TYLENOL) 500 MG tablet Take 500 mg by mouth every 6 (six) hours as needed for mild pain.   Unknown at Unknown time  . Blood Pressure Monitoring (BLOOD PRESSURE KIT) DEVI 1 Device by Does not apply route daily. ICD 10: Z34.00 1 each 0   . loratadine (CLARITIN) 10 MG tablet Take 1 tablet (10 mg total) by mouth daily. 30 tablet 0 Unknown at Unknown time  . metroNIDAZOLE (FLAGYL) 500 MG tablet Take 1 tablet (500 mg total) by mouth 2 (two) times daily. 14 tablet 0   . Misc. Devices MISC Dispense one maternity belt for patient 1 each 0 Unknown at Unknown  time    Review of Systems  Constitutional: Negative for fever.  Eyes: Negative for photophobia and visual disturbance.  Gastrointestinal: Negative for abdominal pain.  Genitourinary: Positive for pelvic pain. Negative for vaginal bleeding.  Musculoskeletal: Negative for back pain.  Neurological: Positive for headaches.   Physical Exam   Blood pressure 123/80, pulse 99, temperature 98.6 F (37 C), temperature source Oral, resp. rate 18, height _0  (1.651 m), weight 80.2 kg, last menstrual period 05/26/2019, SpO2 100 %, unknown if currently breastfeeding.  Physical Exam  Nursing note and vitals reviewed. Constitutional: She is oriented to person, place, and time. She appears well-developed and well-nourished.  Cardiovascular: Normal rate and normal heart sounds.  Respiratory: Effort normal and breath sounds normal.  GI: Soft. She exhibits no distension. There is no abdominal tenderness. There is no rebound and no guarding.  Gravid  Genitourinary:    Vaginal discharge present.     Genitourinary Comments: Pelvic exam: External genitalia normal, vaginal walls pink and well rugated, cervix visually closed, no lesions noted. Thick white discharge visible throughout vaginal vault. Digital exam: cervix 0/thick/posterior    Neurological: She is alert and oriented to person, place, and time.  Skin: Skin is warm and dry.  Psychiatric: She has a normal mood and affect. Her behavior is normal. Judgment and thought content normal.    MAU Course/MDM  Procedures   --Treat presumptively for yeast infection based on physical exam --Abnormal UA with squam epithelial cells noted. Urine culture ordered --Reactive tracing during one hour of prolonged monitoring --Baseline 130, moderate variability, positive 15 x 15 accels, no decels --Toco: rare contraction --Patient asked to push NST button from 2040 to 2105. Button pushed a total of 24 times during that interval. Button disconnected due to very  active movement and patient's endorsement of fetal movement.  Orders Placed This Encounter  Procedures  . Wet prep, genital  . Urinalysis, Routine w reflex microscopic  . Insert peripheral IV   Meds ordered this encounter  Medications  . lactated ringers bolus 1,000 mL  . metoCLOPramide (REGLAN) injection 10 mg  . diphenhydrAMINE (BENADRYL) injection 25 mg  . dexamethasone (DECADRON) injection 10 mg  . terconazole (TERAZOL 7) 0.4 % vaginal cream    Sig: Place 1 applicator vaginally at bedtime. Use for seven days    Dispense:  45 g    Refill:  0    Order Specific Question:   Supervising Provider    Answer:   Debbrah Alar [[id number]]   Patient Vitals for the past 24 hrs:  BP Temp Temp src Pulse Resp SpO2 Height Weight  01/22/20 2133 -- 98.3  F (36.8 C) Oral -- -- -- -- --  01/22/20 2044 125/89 -- -- 94 -- -- -- --  01/22/20 2013 139/88 -- -- (!) 103 -- -- -- --  01/22/20 1957 123/80 98.6 F (37 C) Oral 99 18 100 % _0  (1.651 m) 80.2 kg   Results for orders placed or performed during the hospital encounter of 01/22/20 (from the past 24 hour(s))  Urinalysis, Routine w reflex microscopic     Status: Abnormal   Collection Time: 01/22/20  8:17 PM  Result Value Ref Range   Color, Urine YELLOW YELLOW   APPearance HAZY (A) CLEAR   Specific Gravity, Urine 1.012 1.005 - 1.030   pH 7.0 5.0 - 8.0   Glucose, UA 150 (A) NEGATIVE mg/dL   Hgb urine dipstick NEGATIVE NEGATIVE   Bilirubin Urine NEGATIVE NEGATIVE   Ketones, ur NEGATIVE NEGATIVE mg/dL   Protein, ur NEGATIVE NEGATIVE mg/dL   Nitrite NEGATIVE NEGATIVE   Leukocytes,Ua MODERATE (A) NEGATIVE   RBC / HPF 0-5 0 - 5 RBC/hpf   WBC, UA 6-10 0 - 5 WBC/hpf   Bacteria, UA RARE (A) NONE SEEN   Squamous Epithelial / LPF 21-50 0 - 5   Mucus PRESENT   Wet prep, genital     Status: Abnormal   Collection Time: 01/22/20  8:17 PM   Specimen: Urine, Clean Catch  Result Value Ref Range   Yeast Wet Prep HPF POC NONE SEEN NONE SEEN    Trich, Wet Prep NONE SEEN NONE SEEN   Clue Cells Wet Prep HPF POC NONE SEEN NONE SEEN   WBC, Wet Prep HPF POC FEW (A) NONE SEEN   Sperm NONE SEEN    Assessment and Plan  --29 y.o. G1P0000 at [redacted]w[redacted]d --Headache resolved with IV headache cocktail --Reactive tracing, active fetal movement s/p one hour continuous monitoring --Patient reporting "normal" level of fetal movement throughout evaluation in MAU --Urine culture in work --Discharge home in stable condition  F/U: --CThornton04/28/2021  SDarlina Rumpf CNM 01/22/2020, 9:58 PM

## 2020-01-23 LAB — CULTURE, OB URINE

## 2020-01-25 ENCOUNTER — Ambulatory Visit (INDEPENDENT_AMBULATORY_CARE_PROVIDER_SITE_OTHER): Payer: Medicaid Other | Admitting: Obstetrics & Gynecology

## 2020-01-25 ENCOUNTER — Other Ambulatory Visit: Payer: Self-pay

## 2020-01-25 ENCOUNTER — Other Ambulatory Visit: Payer: Self-pay | Admitting: Obstetrics & Gynecology

## 2020-01-25 VITALS — BP 122/89 | HR 89 | Wt 178.9 lb

## 2020-01-25 DIAGNOSIS — Z3A34 34 weeks gestation of pregnancy: Secondary | ICD-10-CM

## 2020-01-25 DIAGNOSIS — O2343 Unspecified infection of urinary tract in pregnancy, third trimester: Secondary | ICD-10-CM

## 2020-01-25 DIAGNOSIS — B951 Streptococcus, group B, as the cause of diseases classified elsewhere: Secondary | ICD-10-CM

## 2020-01-25 DIAGNOSIS — O10919 Unspecified pre-existing hypertension complicating pregnancy, unspecified trimester: Secondary | ICD-10-CM

## 2020-01-25 DIAGNOSIS — O0993 Supervision of high risk pregnancy, unspecified, third trimester: Secondary | ICD-10-CM

## 2020-01-25 DIAGNOSIS — O99891 Other specified diseases and conditions complicating pregnancy: Secondary | ICD-10-CM

## 2020-01-25 DIAGNOSIS — M549 Dorsalgia, unspecified: Secondary | ICD-10-CM

## 2020-01-25 NOTE — Patient Instructions (Signed)

## 2020-01-25 NOTE — Progress Notes (Signed)
   PRENATAL VISIT NOTE  Subjective:  Janice Duarte is a 29 y.o. G1P0000 at [redacted]w[redacted]d being seen today for ongoing prenatal care.  She is currently monitored for the following issues for this high-risk pregnancy and has Chronic hypertension affecting pregnancy; E. coli UTI (urinary tract infection); Supervision of high-risk pregnancy; GBS (group B streptococcus) UTI complicating pregnancy; Hidradenitis suppurativa; Substance use; and Allergic rhinitis on their problem list.  Patient reports occasional contractions and pelvic pressure.  Contractions: Irritability. Vag. Bleeding: None.  Movement: Present. Denies leaking of fluid.   The following portions of the patient's history were reviewed and updated as appropriate: allergies, current medications, past family history, past medical history, past social history, past surgical history and problem list.   Objective:   Vitals:   01/25/20 0827  BP: (!) 126/91  Pulse: 75  Weight: 178 lb 14.4 oz (81.1 kg)    Fetal Status: Fetal Heart Rate (bpm): 140   Movement: Present     General:  Alert, oriented and cooperative. Patient is in no acute distress.  Skin: Skin is warm and dry. No rash noted.   Cardiovascular: Normal heart rate noted  2. Supervision of high risk pregnancy in third trimester Close f/u  3. Group B Streptococcus urinary tract infection affecting pregnancy in third trimester Treat in labor  Preterm labor symptoms and general obstetric precautions including but not limited to vaginal bleeding, contractions, leaking of fluid and fetal movement were reviewed in detail with the patient. Please refer to After Visit Summary for other counseling recommendations.   Return in about 1 week (around 02/01/2020).  Future Appointments  Date Time Provider Department Center  02/07/2020  8:00 AM WH-MFC NURSE WH-MFC MFC-US  02/07/2020  8:00 AM WH-MFC [place] 1 WH-MFCUS MFC-US  02/08/2020  8:15 AM Reva Bores, MD [facility] WOC  02/15/2020  8:15 AM  Adam Phenix, MD [facility] WOC  02/22/2020  8:15 AM Reva Bores, MD [facility]    Scheryl Darter, MD

## 2020-02-05 ENCOUNTER — Other Ambulatory Visit: Payer: Self-pay

## 2020-02-05 ENCOUNTER — Inpatient Hospital Stay (HOSPITAL_COMMUNITY)
Admission: AD | Admit: 2020-02-05 | Discharge: 2020-02-06 | Disposition: A | Discharge status: Home / Self Care | Payer: Medicaid Other | Attending: Obstetrics & Gynecology | Admitting: Obstetrics & Gynecology

## 2020-02-05 ENCOUNTER — Encounter (HOSPITAL_COMMUNITY): Payer: Self-pay | Admitting: Obstetrics & Gynecology

## 2020-02-05 DIAGNOSIS — Z880 Allergy status to penicillin: Secondary | ICD-10-CM | POA: Diagnosis not present

## 2020-02-05 DIAGNOSIS — Z881 Allergy status to other antibiotic agents status: Secondary | ICD-10-CM | POA: Insufficient documentation

## 2020-02-05 DIAGNOSIS — Z885 Allergy status to narcotic agent status: Secondary | ICD-10-CM | POA: Insufficient documentation

## 2020-02-05 DIAGNOSIS — Z3A36 36 weeks gestation of pregnancy: Secondary | ICD-10-CM | POA: Diagnosis not present

## 2020-02-05 DIAGNOSIS — O26893 Other specified pregnancy related conditions, third trimester: Secondary | ICD-10-CM | POA: Diagnosis not present

## 2020-02-05 DIAGNOSIS — G4489 Other headache syndrome: Secondary | ICD-10-CM | POA: Insufficient documentation

## 2020-02-05 DIAGNOSIS — R102 Pelvic and perineal pain: Secondary | ICD-10-CM | POA: Diagnosis present

## 2020-02-05 DIAGNOSIS — R079 Chest pain, unspecified: Secondary | ICD-10-CM | POA: Insufficient documentation

## 2020-02-05 DIAGNOSIS — O99353 Diseases of the nervous system complicating pregnancy, third trimester: Secondary | ICD-10-CM | POA: Diagnosis not present

## 2020-02-05 NOTE — MAU Note (Signed)
Pt report to MAU c/o HA, chest pain, pelvic pain, and back pain that started today. Pt reports her head and her pelvis are a 10/10 and it is pressure. Pt states her chest pain is a 8/10. No bleeding or LOF. +FM.

## 2020-02-06 DIAGNOSIS — G4489 Other headache syndrome: Secondary | ICD-10-CM

## 2020-02-06 DIAGNOSIS — O99353 Diseases of the nervous system complicating pregnancy, third trimester: Secondary | ICD-10-CM

## 2020-02-06 DIAGNOSIS — Z3A36 36 weeks gestation of pregnancy: Secondary | ICD-10-CM

## 2020-02-06 LAB — COMPREHENSIVE METABOLIC PANEL
ALT: 11 U/L (ref 0–44)
AST: 17 U/L (ref 15–41)
Albumin: 2.7 g/dL — ABNORMAL LOW (ref 3.5–5.0)
Alkaline Phosphatase: 92 U/L (ref 38–126)
Anion gap: 12 (ref 5–15)
BUN: 5 mg/dL — ABNORMAL LOW (ref 6–20)
CO2: 19 mmol/L — ABNORMAL LOW (ref 22–32)
Calcium: 8.9 mg/dL (ref 8.9–10.3)
Chloride: 103 mmol/L (ref 98–111)
Creatinine, Ser: 0.59 mg/dL (ref 0.44–1.00)
GFR calc Af Amer: >60 mL/min (ref >60)
GFR calc non Af Amer: >60 mL/min (ref >60)
Glucose, Bld: 99 mg/dL (ref 70–99)
Potassium: 4.2 mmol/L (ref 3.5–5.1)
Sodium: 134 mmol/L — ABNORMAL LOW (ref 135–145)
Total Bilirubin: 0.4 mg/dL (ref 0.3–1.2)
Total Protein: 6.5 g/dL (ref 6.5–8.1)

## 2020-02-06 LAB — URINALYSIS, ROUTINE W REFLEX MICROSCOPIC
Bilirubin Urine: NEGATIVE
Glucose, UA: NEGATIVE mg/dL
Hgb urine dipstick: NEGATIVE
Ketones, ur: NEGATIVE mg/dL
Nitrite: NEGATIVE
Protein, ur: NEGATIVE mg/dL
Specific Gravity, Urine: 1.005 (ref 1.005–1.030)
pH: 7 (ref 5.0–8.0)

## 2020-02-06 LAB — CBC
HCT: 34 % — ABNORMAL LOW (ref 36.0–46.0)
Hemoglobin: 11.2 g/dL — ABNORMAL LOW (ref 12.0–15.0)
MCH: 24.3 pg — ABNORMAL LOW (ref 26.0–34.0)
MCHC: 32.9 g/dL (ref 30.0–36.0)
MCV: 73.8 fL — ABNORMAL LOW (ref 80.0–100.0)
Platelets: 327 10*3/uL (ref 150–400)
RBC: 4.61 MIL/uL (ref 3.87–5.11)
RDW: 14.9 % (ref 11.5–15.5)
WBC: 12.4 10*3/uL — ABNORMAL HIGH (ref 4.0–10.5)
nRBC: 0 % (ref 0.0–0.2)

## 2020-02-06 LAB — PROTEIN / CREATININE RATIO, URINE
Creatinine, Urine: 36.82 mg/dL
Protein Creatinine Ratio: 0.16 mg/mg{Cre} — ABNORMAL HIGH (ref 0.00–0.15)
Total Protein, Urine: 6 mg/dL

## 2020-02-06 MED ORDER — CYCLOBENZAPRINE HCL 5 MG PO TABS
10.0000 mg | ORAL_TABLET | Freq: Once | ORAL | Status: AC
  Administered 2020-02-06: 10 mg via ORAL
  Filled 2020-02-06: qty 2

## 2020-02-06 MED ORDER — ACETAMINOPHEN 325 MG PO TABS
650.0000 mg | ORAL_TABLET | Freq: Once | ORAL | Status: AC
  Administered 2020-02-06: 01:00:00 650 mg via ORAL
  Filled 2020-02-06: qty 2

## 2020-02-06 MED ORDER — BUTALBITAL-APAP-CAFFEINE 50-325-40 MG PO TABS
2.0000 | ORAL_TABLET | Freq: Once | ORAL | Status: AC
  Administered 2020-02-06: 2 via ORAL
  Filled 2020-02-06: qty 2

## 2020-02-06 NOTE — MAU Provider Note (Signed)
Patient Janice Duarte is a 29 y.o. G1P0000  at 63w4dhere with complaints of round ligament pain, back pain and headache. She denies contractions, decreased fetal movements, vaginal bleeding, vaginal discharge, blurry vision, floating spots, RUQ pain. She reports some pain in her chest, near her collarbone.   She is a chronic hypertensive not on medication. She is frustrated, stating "I just want this pregnancy to be over".    History     CSN: [id number] Arrival date and time: 02/05/20 2248   None     Chief Complaint  Patient presents with  . Headache  . Chest Pain  . Pelvic Pain   Abdominal Pain This is a new problem. The current episode started today. The onset quality is sudden. The problem occurs constantly. The pain is located in the RLQ and LLQ. The pain is at a severity of 10/10. The quality of the pain is sharp. The abdominal pain radiates to the back. Associated symptoms include headaches. Pertinent negatives include no constipation, diarrhea, dysuria, nausea or vomiting. Nothing aggravates the pain. The pain is relieved by nothing.  Headache  This is a new problem. The current episode started today. The problem has been unchanged. The pain is at a severity of 10/10. Associated symptoms include abdominal pain. Pertinent negatives include no nausea, photophobia or vomiting.  Chest pain is aching, it is right under her collarbone. Nothing makes it better or worse. It is not associated with any other symptom. She denies SOB, difficulty breathing, crushing chest pain.   OB History    Gravida  1   Para  0   Term  0   Preterm  0   AB  0   Living  0     SAB  0   TAB  0   Ectopic  0   Multiple  0   Live Births  0           Past Medical History:  Diagnosis Date  . Abscess   . Bacterial vaginal infection   . Headaches, cluster   . Hidradenitis suppurativa    Breast, axillae, groin  . Hypertension   . Scoliosis   . Scoliosis     Past Surgical  History:  Procedure Laterality Date  . WISDOM TOOTH EXTRACTION      Family History  Problem Relation Age of Onset  . Diabetes Other   . Hypertension Other   . Hypertension Mother   . Heart failure Mother   . Hepatitis B Mother   . Cirrhosis Mother   . Hypertension Paternal Aunt     Social History   Tobacco Use  . Smoking status: Never Smoker  . Smokeless tobacco: Never Used  Substance Use Topics  . Alcohol use: Not Currently    Comment: socially  . Drug use: Not Currently    Frequency: 7.0 times per week    Types: Marijuana    Comment: LAST SMOKED 12-29-19    Allergies:  Allergies  Allergen Reactions  . Penicillins Hives    Has patient had a PCN reaction causing immediate rash, facial/tongue/throat swelling, SOB or lightheadedness with hypotension: Yes Has patient had a PCN reaction causing severe rash involving mucus membranes or skin necrosis: Yes Has patient had a PCN reaction that required hospitalization No Has patient had a PCN reaction occurring within the last 10 years: Yes  If all of the above answers are "NO", then may proceed with Cephalosporin use.   .Marland KitchenMorphine And Related  Rash  . Bactrim [Sulfamethoxazole-Trimethoprim] Hives    Medications Prior to Admission  Medication Sig Dispense Refill Last Dose  . acetaminophen (TYLENOL) 500 MG tablet Take 500 mg by mouth every 6 (six) hours as needed for mild pain.   02/05/2020 at 2000  . aspirin EC 81 MG tablet Take 1 tablet (81 mg total) by mouth daily. 100 tablet 2 02/04/2020 at 2300  . Blood Pressure Monitoring (BLOOD PRESSURE KIT) DEVI 1 Device by Does not apply route daily. ICD 10: Z34.00 1 each 0 02/04/2020 at Unknown time  . loratadine (CLARITIN) 10 MG tablet Take 1 tablet (10 mg total) by mouth daily. 30 tablet 0 Past Month at Unknown time  . Prenatal MV-Min-FA-Omega-3 (PRENATAL GUMMIES/DHA & FA PO) Take 2 tablets by mouth daily.   02/05/2020 at Unknown time  . cyclobenzaprine (FLEXERIL) 10 MG tablet TAKE 1 TABLET  (10 MG TOTAL) BY MOUTH EVERY 8 (EIGHT) HOURS AS NEEDED FOR MUSCLE SPASMS. 30 tablet 1   . metroNIDAZOLE (FLAGYL) 500 MG tablet Take 1 tablet (500 mg total) by mouth 2 (two) times daily. 14 tablet 0   . Misc. Devices MISC Dispense one maternity belt for patient 1 each 0   . terconazole (TERAZOL 7) 0.4 % vaginal cream Place 1 applicator vaginally at bedtime. Use for seven days 45 g 0     Review of Systems  Constitutional: Negative.   HENT: Negative.   Eyes: Negative for photophobia and visual disturbance.  Respiratory: Negative.   Cardiovascular: Negative.   Gastrointestinal: Positive for abdominal pain. Negative for constipation, diarrhea, nausea and vomiting.  Genitourinary: Negative for dysuria, vaginal bleeding and vaginal pain.  Neurological: Positive for headaches.   Physical Exam   Blood pressure 119/82, pulse 90, temperature 98.5 F (36.9 C), temperature source Oral, resp. rate 17, last menstrual period 05/26/2019, unknown if currently breastfeeding.  Physical Exam  Constitutional: She appears well-developed.  HENT:  Head: Normocephalic.  Respiratory: Effort normal.  GI: Soft.  Genitourinary:    Vagina normal.     Genitourinary Comments: NEFG; closed, long, posterior   Musculoskeletal:        General: Normal range of motion.     Cervical back: Normal range of motion.  Neurological: She is alert.  Skin: Skin is warm.   Patient Vitals for the past 24 hrs:  BP Temp Temp src Pulse Resp  02/06/20 0031 122/86 - - 87 -  02/06/20 0016 128/82 - - 80 -  02/06/20 0001 119/82 - - 90 -  02/05/20 2346 123/80 - - 88 -  02/05/20 2338 126/87 - - 92 -  02/05/20 2327 (!) 141/91 - - 95 -  02/05/20 2322 131/88 98.5 F (36.9 C) Oral 89 17    MAU Course  Procedures  MDM -Patient declined HA cocktail.  -Pain now 0/10 for headache and back/abdominal pain after fioricet and flexeril.  -Pre-e labs drawn after patient had elevated blood pressure reading; CBC and PCR are normal; CMP is  pending -EKG is normal -NST: 120 bpm, mod var, present acel, no decels, uterine irritability with occasional contractions. NST reactive.   -Reassured patient that her chest pain is muscularskeletal; given its location and lack of associated concerning symptoms. Patient verbalized understanding.  -Cervix is long, closed, posterior and no contractions noted on toco.   Assessment and Plan   1. Other headache syndrome    -Patient safe for discharge; patient plans to keep OB appt on Tuesday and [place] appt on Wednesday. Strict return precautions given.  Patient safe for discharge with CMP pending. Patient agrees to return if any changes in symptoms.   Mervyn Skeeters Tryce Surratt 02/06/2020, 12:38 AM

## 2020-02-07 ENCOUNTER — Ambulatory Visit (HOSPITAL_COMMUNITY): Payer: Medicaid Other | Attending: Obstetrics and Gynecology

## 2020-02-07 ENCOUNTER — Other Ambulatory Visit: Payer: Self-pay

## 2020-02-07 ENCOUNTER — Ambulatory Visit: Payer: Medicaid Other | Admitting: *Deleted

## 2020-02-07 DIAGNOSIS — O10919 Unspecified pre-existing hypertension complicating pregnancy, unspecified trimester: Secondary | ICD-10-CM

## 2020-02-07 DIAGNOSIS — Z362 Encounter for other antenatal screening follow-up: Secondary | ICD-10-CM | POA: Diagnosis not present

## 2020-02-07 DIAGNOSIS — Z3A36 36 weeks gestation of pregnancy: Secondary | ICD-10-CM | POA: Diagnosis not present

## 2020-02-07 DIAGNOSIS — Z148 Genetic carrier of other disease: Secondary | ICD-10-CM | POA: Diagnosis not present

## 2020-02-07 DIAGNOSIS — O10013 Pre-existing essential hypertension complicating pregnancy, third trimester: Secondary | ICD-10-CM

## 2020-02-07 DIAGNOSIS — O0993 Supervision of high risk pregnancy, unspecified, third trimester: Secondary | ICD-10-CM | POA: Diagnosis present

## 2020-02-07 DIAGNOSIS — O10913 Unspecified pre-existing hypertension complicating pregnancy, third trimester: Secondary | ICD-10-CM | POA: Diagnosis not present

## 2020-02-08 ENCOUNTER — Ambulatory Visit (INDEPENDENT_AMBULATORY_CARE_PROVIDER_SITE_OTHER): Payer: Medicaid Other | Admitting: Family Medicine

## 2020-02-08 ENCOUNTER — Encounter: Payer: Self-pay | Admitting: Family Medicine

## 2020-02-08 VITALS — BP 124/82 | HR 84 | Wt 175.7 lb

## 2020-02-08 DIAGNOSIS — O10919 Unspecified pre-existing hypertension complicating pregnancy, unspecified trimester: Secondary | ICD-10-CM

## 2020-02-08 DIAGNOSIS — O10913 Unspecified pre-existing hypertension complicating pregnancy, third trimester: Secondary | ICD-10-CM

## 2020-02-08 DIAGNOSIS — O99013 Anemia complicating pregnancy, third trimester: Secondary | ICD-10-CM

## 2020-02-08 DIAGNOSIS — D573 Sickle-cell trait: Secondary | ICD-10-CM | POA: Insufficient documentation

## 2020-02-08 DIAGNOSIS — B951 Streptococcus, group B, as the cause of diseases classified elsewhere: Secondary | ICD-10-CM

## 2020-02-08 DIAGNOSIS — Z3A36 36 weeks gestation of pregnancy: Secondary | ICD-10-CM

## 2020-02-08 DIAGNOSIS — O0993 Supervision of high risk pregnancy, unspecified, third trimester: Secondary | ICD-10-CM

## 2020-02-08 DIAGNOSIS — D563 Thalassemia minor: Secondary | ICD-10-CM

## 2020-02-08 DIAGNOSIS — O2343 Unspecified infection of urinary tract in pregnancy, third trimester: Secondary | ICD-10-CM

## 2020-02-08 HISTORY — DX: Thalassemia minor: D56.3

## 2020-02-08 HISTORY — DX: Sickle-cell trait: D57.3

## 2020-02-08 LAB — POCT URINALYSIS DIP (DEVICE)
Bilirubin Urine: NEGATIVE
Glucose, UA: NEGATIVE mg/dL
Hgb urine dipstick: NEGATIVE
Ketones, ur: NEGATIVE mg/dL
Nitrite: NEGATIVE
Protein, ur: NEGATIVE mg/dL
Specific Gravity, Urine: 1.025 (ref 1.005–1.030)
Urobilinogen, UA: 0.2 mg/dL (ref 0.0–1.0)
pH: 6 (ref 5.0–8.0)

## 2020-02-08 NOTE — Patient Instructions (Signed)

## 2020-02-08 NOTE — Progress Notes (Signed)
   PRENATAL VISIT NOTE  Subjective:  Janice Duarte is a 29 y.o. G1P0000 at [redacted]w[redacted]d being seen today for ongoing prenatal care.  She is currently monitored for the following issues for this high-risk pregnancy and has Chronic hypertension affecting pregnancy; E. coli UTI (urinary tract infection); Supervision of high-risk pregnancy; GBS (group B streptococcus) UTI complicating pregnancy; Hidradenitis suppurativa; Substance use; Allergic rhinitis; Alpha thalassemia silent carrier; and Sickle cell trait (HCC) on their problem list.  Patient reports backache, fatigue and occasional contractions.  Contractions: Irregular. Vag. Bleeding: None.  Movement: Present. Denies leaking of fluid.   The following portions of the patient's history were reviewed and updated as appropriate: allergies, current medications, past family history, past medical history, past social history, past surgical history and problem list.   Objective:   Vitals:   02/08/20 0831  BP: 124/82  Pulse: 84  Weight: 175 lb 11.2 oz (79.7 kg)    Fetal Status: Fetal Heart Rate (bpm): 120 Fundal Height: 34 cm Movement: Present  Presentation: Vertex  General:  Alert, oriented and cooperative. Patient is in no acute distress.  Skin: Skin is warm and dry. No rash noted.   Cardiovascular: Normal heart rate noted  Respiratory: Normal respiratory effort, no problems with respiration noted  Abdomen: Soft, gravid, appropriate for gestational age.  Pain/Pressure: Present     Pelvic: Cervical exam performed in the presence of a chaperone Dilation: 1 Effacement (%): 40 Station: -2  Extremities: Normal range of motion.  Edema: None  Mental Status: Normal mood and affect. Normal behavior. Normal judgment and thought content.   Assessment and Plan:  Pregnancy: G1P0000 at [redacted]w[redacted]d 1. Chronic hypertension affecting pregnancy BP is well controlled on no meds U/S on 5/11 shows 20% growth For IOL at 39 wks - orders placed, message sent--consider  outpt. Foley on 5/26.  2. Group B Streptococcus urinary tract infection affecting pregnancy in third trimester Will need treatment in labor  3. Supervision of high risk pregnancy in third trimester   4. Alpha thalassemia silent carrier   5. Sickle cell trait (HCC) N FOB testing as he is unavailable now.  Preterm labor symptoms and general obstetric precautions including but not limited to vaginal bleeding, contractions, leaking of fluid and fetal movement were reviewed in detail with the patient. Please refer to After Visit Summary for other counseling recommendations.   Return in 1 week (on 02/15/2020).  Future Appointments  Date Time Provider Department Center  02/15/2020  8:15 AM Adam Phenix, MD [facility]  02/22/2020  8:15 AM Reva Bores, MD [facility]    Reva Bores, MD

## 2020-02-08 NOTE — Progress Notes (Signed)
Induction Assessment Scheduling Form: Fax to [facility]:  [phone] Route to [facility]-2S Labor Delivery   Janice Duarte                                                                                   DOB:  [birth year]                                                            MRN:  [id number]  Phone:  Home Phone [phone]  Mobile [phone]    Provider:  [facility]  Admission Date/Time:  02/23/2019 am GP:  G1P0000     Gestational age on admission:  39                                                Estimated Date of Delivery: 03/01/20  Dating Criteria: LMP  Filed Weights   02/08/20 0831  Weight: 175 lb 11.2 oz (79.7 kg)    GBS:  Positive in urine HIV:  Non Reactive (03/17 0839)  Medical Indications for induction:  [facility] Scheduling Provider Signature:  Reva Bores, MD      Dilation: 1 Effacement (%): 40 Station: -2 Presentation: Vertex  Method of induction(proposed):  Foley - outpt, possible + cytotec   Scheduling Provider Signature:  Reva Bores, MD                                            Today's Date:  02/08/2020

## 2020-02-09 ENCOUNTER — Encounter (HOSPITAL_COMMUNITY): Payer: Self-pay | Admitting: *Deleted

## 2020-02-09 ENCOUNTER — Telehealth (HOSPITAL_COMMUNITY): Payer: Self-pay | Admitting: *Deleted

## 2020-02-09 NOTE — Telephone Encounter (Signed)
Preadmission screen  

## 2020-02-14 ENCOUNTER — Other Ambulatory Visit: Payer: Self-pay | Admitting: Advanced Practice Midwife

## 2020-02-15 ENCOUNTER — Ambulatory Visit (INDEPENDENT_AMBULATORY_CARE_PROVIDER_SITE_OTHER): Payer: Medicaid Other | Admitting: Obstetrics & Gynecology

## 2020-02-15 ENCOUNTER — Other Ambulatory Visit: Payer: Self-pay

## 2020-02-15 ENCOUNTER — Other Ambulatory Visit (HOSPITAL_COMMUNITY)
Admission: RE | Admit: 2020-02-15 | Discharge: 2020-02-15 | Disposition: A | Discharge status: Home / Self Care | Payer: Medicaid Other | Source: Ambulatory Visit | Attending: Obstetrics & Gynecology | Admitting: Obstetrics & Gynecology

## 2020-02-15 VITALS — BP 121/81 | HR 86 | Wt 179.1 lb

## 2020-02-15 DIAGNOSIS — O0993 Supervision of high risk pregnancy, unspecified, third trimester: Secondary | ICD-10-CM | POA: Insufficient documentation

## 2020-02-15 DIAGNOSIS — Z3A37 37 weeks gestation of pregnancy: Secondary | ICD-10-CM

## 2020-02-15 DIAGNOSIS — B951 Streptococcus, group B, as the cause of diseases classified elsewhere: Secondary | ICD-10-CM

## 2020-02-15 DIAGNOSIS — O99820 Streptococcus B carrier state complicating pregnancy: Secondary | ICD-10-CM

## 2020-02-15 DIAGNOSIS — B9620 Unspecified Escherichia coli [E. coli] as the cause of diseases classified elsewhere: Secondary | ICD-10-CM

## 2020-02-15 DIAGNOSIS — O2343 Unspecified infection of urinary tract in pregnancy, third trimester: Secondary | ICD-10-CM

## 2020-02-15 NOTE — Patient Instructions (Signed)

## 2020-02-15 NOTE — Progress Notes (Signed)
   PRENATAL VISIT NOTE  Subjective:  Janice Duarte is a 29 y.o. G1P0000 at [redacted]w[redacted]d being seen today for ongoing prenatal care.  She is currently monitored for the following issues for this high-risk pregnancy and has Chronic hypertension affecting pregnancy; E. coli UTI (urinary tract infection); Supervision of high-risk pregnancy; GBS (group B streptococcus) UTI complicating pregnancy; Hidradenitis suppurativa; Substance use; Allergic rhinitis; Alpha thalassemia silent carrier; and Sickle cell trait (HCC) on their problem list.  Patient reports no complaints.  Contractions: Irritability. Vag. Bleeding: None.  Movement: Present. Denies leaking of fluid.   The following portions of the patient's history were reviewed and updated as appropriate: allergies, current medications, past family history, past medical history, past social history, past surgical history and problem list.   Objective:   Vitals:   02/15/20 0826  BP: 121/81  Pulse: 86  Weight: 179 lb 1.6 oz (81.2 kg)    Fetal Status: Fetal Heart Rate (bpm): 140   Movement: Present  Presentation: Vertex  General:  Alert, oriented and cooperative. Patient is in no acute distress.  Skin: Skin is warm and dry. No rash noted.   Cardiovascular: Normal heart rate noted  Respiratory: Normal respiratory effort, no problems with respiration noted  Abdomen: Soft, gravid, appropriate for gestational age.  Pain/Pressure: Present     Pelvic: Cervical exam performed in the presence of a chaperone Dilation: 1 Effacement (%): 40 Station: -3  Extremities: Normal range of motion.  Edema: None  Mental Status: Normal mood and affect. Normal behavior. Normal judgment and thought content.   Assessment and Plan:  Pregnancy: G1P0000 at [redacted]w[redacted]d 1. Supervision of high risk pregnancy in third trimester GBS positive - GC/Chlamydia probe amp ([place])not at [facility]  2. Group B Streptococcus urinary tract infection affecting pregnancy in third  trimester Treat in labor  Term labor symptoms and general obstetric precautions including but not limited to vaginal bleeding, contractions, leaking of fluid and fetal movement were reviewed in detail with the patient. Please refer to After Visit Summary for other counseling recommendations.   Return in about 1 week (around 02/22/2020).   Future Appointments  Date Time Provider Department Center  02/21/2020  9:30 AM MC-SCREENING MC-SDSC None  02/22/2020  2:15 PM Hermina Staggers, MD [facility]  02/22/2020  3:15 PM [facility] NST [facility]  02/23/2020  6:30 AM MC-LD SCHED ROOM MC-INDC None    Scheryl Darter, MD

## 2020-02-16 LAB — GC/CHLAMYDIA PROBE AMP (~~LOC~~) NOT AT ARMC
Chlamydia: NEGATIVE
Comment: NEGATIVE
Comment: NORMAL
Neisseria Gonorrhea: NEGATIVE

## 2020-02-19 ENCOUNTER — Encounter (HOSPITAL_COMMUNITY): Payer: Self-pay | Admitting: Family Medicine

## 2020-02-19 ENCOUNTER — Other Ambulatory Visit: Payer: Self-pay

## 2020-02-19 ENCOUNTER — Inpatient Hospital Stay (HOSPITAL_COMMUNITY)
Admission: AD | Admit: 2020-02-19 | Discharge: 2020-02-22 | DRG: 807 | Disposition: A | Discharge status: Home / Self Care | Payer: Medicaid Other | Attending: Obstetrics & Gynecology | Admitting: Obstetrics & Gynecology

## 2020-02-19 DIAGNOSIS — O99824 Streptococcus B carrier state complicating childbirth: Secondary | ICD-10-CM | POA: Diagnosis present

## 2020-02-19 DIAGNOSIS — Z880 Allergy status to penicillin: Secondary | ICD-10-CM | POA: Diagnosis not present

## 2020-02-19 DIAGNOSIS — O10919 Unspecified pre-existing hypertension complicating pregnancy, unspecified trimester: Secondary | ICD-10-CM | POA: Diagnosis present

## 2020-02-19 DIAGNOSIS — Z30017 Encounter for initial prescription of implantable subdermal contraceptive: Secondary | ICD-10-CM

## 2020-02-19 DIAGNOSIS — O1092 Unspecified pre-existing hypertension complicating childbirth: Secondary | ICD-10-CM | POA: Diagnosis not present

## 2020-02-19 DIAGNOSIS — D573 Sickle-cell trait: Secondary | ICD-10-CM | POA: Diagnosis present

## 2020-02-19 DIAGNOSIS — O1002 Pre-existing essential hypertension complicating childbirth: Principal | ICD-10-CM | POA: Diagnosis present

## 2020-02-19 DIAGNOSIS — O9902 Anemia complicating childbirth: Secondary | ICD-10-CM | POA: Diagnosis present

## 2020-02-19 DIAGNOSIS — O0993 Supervision of high risk pregnancy, unspecified, third trimester: Secondary | ICD-10-CM

## 2020-02-19 DIAGNOSIS — Z7982 Long term (current) use of aspirin: Secondary | ICD-10-CM

## 2020-02-19 DIAGNOSIS — Z3A38 38 weeks gestation of pregnancy: Secondary | ICD-10-CM

## 2020-02-19 DIAGNOSIS — O99214 Obesity complicating childbirth: Secondary | ICD-10-CM | POA: Diagnosis present

## 2020-02-19 DIAGNOSIS — O2340 Unspecified infection of urinary tract in pregnancy, unspecified trimester: Secondary | ICD-10-CM | POA: Diagnosis present

## 2020-02-19 DIAGNOSIS — B951 Streptococcus, group B, as the cause of diseases classified elsewhere: Secondary | ICD-10-CM

## 2020-02-19 DIAGNOSIS — Z20822 Contact with and (suspected) exposure to covid-19: Secondary | ICD-10-CM | POA: Diagnosis present

## 2020-02-19 DIAGNOSIS — D563 Thalassemia minor: Secondary | ICD-10-CM

## 2020-02-19 DIAGNOSIS — J302 Other seasonal allergic rhinitis: Secondary | ICD-10-CM

## 2020-02-19 LAB — COMPREHENSIVE METABOLIC PANEL
ALT: 11 U/L (ref 0–44)
AST: 18 U/L (ref 15–41)
Albumin: 2.6 g/dL — ABNORMAL LOW (ref 3.5–5.0)
Alkaline Phosphatase: 90 U/L (ref 38–126)
Anion gap: 11 (ref 5–15)
BUN: 7 mg/dL (ref 6–20)
CO2: 20 mmol/L — ABNORMAL LOW (ref 22–32)
Calcium: 8.8 mg/dL — ABNORMAL LOW (ref 8.9–10.3)
Chloride: 102 mmol/L (ref 98–111)
Creatinine, Ser: 0.71 mg/dL (ref 0.44–1.00)
GFR calc Af Amer: >60 mL/min (ref >60)
GFR calc non Af Amer: >60 mL/min (ref >60)
Glucose, Bld: 166 mg/dL — ABNORMAL HIGH (ref 70–99)
Potassium: 4.1 mmol/L (ref 3.5–5.1)
Sodium: 133 mmol/L — ABNORMAL LOW (ref 135–145)
Total Bilirubin: 0.5 mg/dL (ref 0.3–1.2)
Total Protein: 6.3 g/dL — ABNORMAL LOW (ref 6.5–8.1)

## 2020-02-19 LAB — URINALYSIS, ROUTINE W REFLEX MICROSCOPIC
Bilirubin Urine: NEGATIVE
Glucose, UA: 50 mg/dL — AB
Hgb urine dipstick: NEGATIVE
Ketones, ur: NEGATIVE mg/dL
Nitrite: NEGATIVE
Protein, ur: NEGATIVE mg/dL
Specific Gravity, Urine: 1.006 (ref 1.005–1.030)
pH: 6 (ref 5.0–8.0)

## 2020-02-19 LAB — SARS CORONAVIRUS 2 BY RT PCR (HOSPITAL ORDER, PERFORMED IN ~~LOC~~ HOSPITAL LAB): SARS Coronavirus 2: NEGATIVE

## 2020-02-19 LAB — CBC
HCT: 34.6 % — ABNORMAL LOW (ref 36.0–46.0)
Hemoglobin: 11.5 g/dL — ABNORMAL LOW (ref 12.0–15.0)
MCH: 24.6 pg — ABNORMAL LOW (ref 26.0–34.0)
MCHC: 33.2 g/dL (ref 30.0–36.0)
MCV: 74.1 fL — ABNORMAL LOW (ref 80.0–100.0)
Platelets: 336 10*3/uL (ref 150–400)
RBC: 4.67 MIL/uL (ref 3.87–5.11)
RDW: 15.2 % (ref 11.5–15.5)
WBC: 12.7 10*3/uL — ABNORMAL HIGH (ref 4.0–10.5)
nRBC: 0 % (ref 0.0–0.2)

## 2020-02-19 LAB — PROTEIN / CREATININE RATIO, URINE
Creatinine, Urine: 46.55 mg/dL
Protein Creatinine Ratio: 0.15 mg/mg{Cre} (ref 0.00–0.15)
Total Protein, Urine: 7 mg/dL

## 2020-02-19 LAB — TYPE AND SCREEN
ABO/RH(D): AB POS
Antibody Screen: NEGATIVE

## 2020-02-19 MED ORDER — LIDOCAINE HCL (PF) 1 % IJ SOLN: 30.0000 mL | INTRAMUSCULAR | Status: DC | PRN

## 2020-02-19 MED ORDER — MISOPROSTOL 50MCG HALF TABLET: 50.0000 ug | ORAL_TABLET | ORAL | Status: DC

## 2020-02-19 MED ORDER — DEXAMETHASONE SODIUM PHOSPHATE 10 MG/ML IJ SOLN
10.0000 mg | Freq: Once | INTRAMUSCULAR | Status: AC
  Administered 2020-02-19: 10 mg via INTRAVENOUS
  Filled 2020-02-19: qty 1

## 2020-02-19 MED ORDER — VANCOMYCIN HCL IN DEXTROSE 1-5 GM/200ML-% IV SOLN
1000.0000 mg | Freq: Two times a day (BID) | INTRAVENOUS | Status: DC
  Administered 2020-02-19 – 2020-02-20 (×3): 1000 mg via INTRAVENOUS
  Filled 2020-02-19 (×3): qty 200

## 2020-02-19 MED ORDER — LACTATED RINGERS IV SOLN: INTRAVENOUS | Status: DC

## 2020-02-19 MED ORDER — LACTATED RINGERS IV BOLUS
150.0000 mL | Freq: Once | INTRAVENOUS | Status: AC
  Administered 2020-02-19: 150 mL via INTRAVENOUS

## 2020-02-19 MED ORDER — METOCLOPRAMIDE HCL 5 MG/ML IJ SOLN
10.0000 mg | Freq: Once | INTRAMUSCULAR | Status: AC
  Administered 2020-02-19: 10 mg via INTRAVENOUS
  Filled 2020-02-19: qty 2

## 2020-02-19 MED ORDER — OXYCODONE-ACETAMINOPHEN 5-325 MG PO TABS: 1.0000 | ORAL_TABLET | ORAL | Status: DC | PRN

## 2020-02-19 MED ORDER — DIPHENHYDRAMINE HCL 50 MG/ML IJ SOLN
25.0000 mg | Freq: Once | INTRAMUSCULAR | Status: AC
  Administered 2020-02-19: 25 mg via INTRAVENOUS
  Filled 2020-02-19: qty 1

## 2020-02-19 MED ORDER — FLEET ENEMA 7-19 GM/118ML RE ENEM: 1.0000 | ENEMA | RECTAL | Status: DC | PRN

## 2020-02-19 MED ORDER — OXYTOCIN 40 UNITS IN NORMAL SALINE INFUSION - SIMPLE MED
2.5000 [IU]/h | INTRAVENOUS | Status: DC
  Filled 2020-02-19: qty 1000

## 2020-02-19 MED ORDER — LACTATED RINGERS IV SOLN
500.0000 mL | INTRAVENOUS | Status: DC | PRN
  Administered 2020-02-19 (×2): 250 mL via INTRAVENOUS
  Administered 2020-02-20: 500 mL via INTRAVENOUS
  Administered 2020-02-20 (×2): 250 mL via INTRAVENOUS
  Administered 2020-02-20: 300 mL via INTRAVENOUS

## 2020-02-19 MED ORDER — SOD CITRATE-CITRIC ACID 500-334 MG/5ML PO SOLN
30.0000 mL | ORAL | Status: DC | PRN
  Administered 2020-02-20: 30 mL via ORAL
  Filled 2020-02-19: qty 30

## 2020-02-19 MED ORDER — FENTANYL CITRATE (PF) 100 MCG/2ML IJ SOLN
100.0000 ug | INTRAMUSCULAR | Status: DC | PRN
  Administered 2020-02-19 – 2020-02-20 (×3): 100 ug via INTRAVENOUS
  Filled 2020-02-19 (×3): qty 2

## 2020-02-19 MED ORDER — OXYCODONE-ACETAMINOPHEN 5-325 MG PO TABS: 2.0000 | ORAL_TABLET | ORAL | Status: DC | PRN

## 2020-02-19 MED ORDER — MISOPROSTOL 25 MCG QUARTER TABLET
25.0000 ug | ORAL_TABLET | ORAL | Status: DC | PRN
  Administered 2020-02-19: 25 ug via VAGINAL
  Filled 2020-02-19: qty 1

## 2020-02-19 MED ORDER — ACETAMINOPHEN 325 MG PO TABS: 650.0000 mg | ORAL_TABLET | ORAL | Status: DC | PRN

## 2020-02-19 MED ORDER — TERBUTALINE SULFATE 1 MG/ML IJ SOLN: 0.2500 mg | Freq: Once | INTRAMUSCULAR | Status: DC | PRN

## 2020-02-19 MED ORDER — ONDANSETRON HCL 4 MG/2ML IJ SOLN: 4.0000 mg | Freq: Four times a day (QID) | INTRAMUSCULAR | Status: DC | PRN

## 2020-02-19 MED ORDER — OXYTOCIN BOLUS FROM INFUSION
500.0000 mL | Freq: Once | INTRAVENOUS | Status: AC
  Administered 2020-02-21: 500 mL via INTRAVENOUS

## 2020-02-19 NOTE — MAU Note (Addendum)
Pt states that when she woke up she felt  pressure in back of neck, her feet and hands are swollen, and feels short of breath. States her hips also have been hurting. Denies contractions, bleeding or LOF. Says fetal movement is slightly less than what is has been.  Has headache 8/10, denies vision changes or RUQ pain.

## 2020-02-19 NOTE — MAU Note (Signed)
Vertex presentation verified via bedside U/S.  

## 2020-02-19 NOTE — Progress Notes (Signed)
Patient ID: Janice Duarte, female   DOB: 1991/03/01, 29 y.o.   MRN: [id number] Hurting with contractions Blood pressure (!) 149/89, pulse 71, temperature 98.1 F (36.7 C), temperature source Axillary, resp. rate 18, height 5\' 5"  (1.651 m), weight 84.5 kg, last menstrual period 05/26/2019, SpO2 99 %, unknown if currently breastfeeding.   Vitals:   02/19/20 1715 02/19/20 1845 02/19/20 1939 02/19/20 1942  BP: 134/82 (!) 153/92  (!) 149/89  Pulse: 83 92  71  Resp: 16 16  18   Temp:   98.1 F (36.7 C)   TempSrc:   Axillary   SpO2:      Weight:      Height:       FHR reassuring, category I UCs frequent q1.11min  Dilation: 1 Effacement (%): 50 Cervical Position: Posterior Station: -3 Presentation: Vertex Exam by:: 4m, CNM  Foley in place  WIll hold cytotec for now due to tachysystole

## 2020-02-19 NOTE — Progress Notes (Signed)
Janice Duarte is a 29 y.o. G1P0000 at [redacted]w[redacted]d  Subjective: Endorsing moderately painful contractions s/p Cytotec. Rates her contraction pain as 6-7/10. Declines pain medicine.  Objective: BP (!) 149/89   Pulse 71   Temp 98.1 F (36.7 C) (Axillary)   Resp 18   Ht 5\' 5"  (1.651 m)   Wt 84.5 kg   LMP 05/26/2019 (Exact Date)   SpO2 99%   BMI 30.99 kg/m  No intake/output data recorded. No intake/output data recorded.  FHT: 125, mod, 15 x 15 accels, no decels UC:  ?Irregular q 2-7 min SVE:   Dilation: 1 Effacement (%): 50 Station: -3 Exam by:: [email or web address], CNM  Labs: Lab Results  Component Value Date   WBC 12.7 (H) 02/19/2020   HGB 11.5 (L) 02/19/2020   HCT 34.6 (L) 02/19/2020   MCV 74.1 (L) 02/19/2020   PLT 336 02/19/2020    Assessment / Plan: --29 y.o. G1P0000 at [redacted]w[redacted]d  --CHTN, PEC labs normal on admission --No severe range or severe symptoms, continue to monitor --GBS +, Vancomycin administered at 1711 --Foley balloon placed, 50 mcg Cyotec buccal ordered when 4 hours from previous.  --FB placement tolerated very well by patient --Anticipate NSVD  [redacted]w[redacted]d, CNM 02/19/2020, 7:55 PM

## 2020-02-19 NOTE — H&P (Signed)
Janice Duarte is a 29 y.o. female presenting for IOL for Chronic Hypertension. On arrival to MAU patient endorses headache pain 7/10, neck stiffness and swelling, intermittent visual disturbances, lower extremity swelling. She denies contraction pain, vaginal bleeding, leaking of fluid, decreased fetal movement, fever, falls, or recent illness.   Prenatal History --[facility] --Dating by LMP c/w 1st trimester [place] --Care initiated at 14w 4d --Low risk NIPS --Alpha Thalassemia silent carrier --Rubella Immune --TDAP 12/14/2019  OB History    Gravida  1   Para  0   Term  0   Preterm  0   AB  0   Living  0     SAB  0   TAB  0   Ectopic  0   Multiple  0   Live Births  0          Past Medical History:  Diagnosis Date  . Abscess   . Bacterial vaginal infection   . Headaches, cluster   . Hidradenitis suppurativa    Breast, axillae, groin  . Hypertension   . Scoliosis   . Scoliosis    Past Surgical History:  Procedure Laterality Date  . WISDOM TOOTH EXTRACTION     Family History: family history includes Cirrhosis in her mother; Diabetes in an other family member; Heart failure in her mother; Hepatitis B in her mother; Hypertension in her mother, paternal aunt, paternal grandmother, and another family member; Thyroid disease in her paternal grandmother. Social History:  reports that she has never smoked. She has never used smokeless tobacco. She reports previous alcohol use. She reports previous drug use. Frequency: 7.00 times per week. Drug: Marijuana.     Maternal Diabetes: No Genetic Screening: Normal Maternal Ultrasounds/Referrals: Normal Fetal Ultrasounds or other Referrals:  None Maternal Substance Abuse:  Yes:  Type: Marijuana Significant Maternal Medications:  None Significant Maternal Lab Results:  Group B Strep positive Other Comments:  PCN allergy, Vancomycin ordered  Review of Systems  Eyes: Positive for visual disturbance.  Gastrointestinal:  Negative for abdominal pain.  Musculoskeletal: Negative for back pain.  Neurological: Positive for headaches.  All other systems reviewed and are negative.    Blood pressure 128/85, pulse 79, temperature 98.9 F (37.2 C), temperature source Oral, resp. rate 18, height 5\' 5"  (1.651 m), weight 84.5 kg, last menstrual period 05/26/2019, SpO2 99 %, unknown if currently breastfeeding.   Patient Vitals for the past 24 hrs:  BP Temp Temp src Pulse Resp SpO2 Height Weight  02/19/20 1515 (!) 149/94 98.5 F (36.9 C) Oral 71 18 -- -- --  02/19/20 1446 (!) 142/97 -- -- 84 -- -- -- --  02/19/20 1431 138/87 -- -- 76 -- -- -- --  02/19/20 1416 (!) 149/93 -- -- 70 -- -- -- --  02/19/20 1401 128/85 -- -- 79 -- -- -- --  02/19/20 1346 129/89 -- -- 84 -- -- -- --  02/19/20 1331 (!) 127/91 -- -- 81 -- -- -- --  02/19/20 1325 -- -- -- -- -- 99 % -- --  02/19/20 1320 -- -- -- -- -- 99 % -- --  02/19/20 1316 135/90 -- -- 84 -- -- -- --  02/19/20 1315 -- -- -- -- -- 100 % -- --  02/19/20 1310 -- -- -- -- -- 100 % -- --  02/19/20 1305 -- -- -- -- -- 100 % -- --  02/19/20 1301 (!) 151/95 -- -- 75 -- -- -- --  02/19/20 1300 -- -- -- -- --  100 % -- --  02/19/20 1255 -- -- -- -- -- 100 % -- --  02/19/20 1250 -- -- -- -- -- 100 % -- --  02/19/20 1246 134/85 -- -- 83 -- -- -- --  02/19/20 1245 -- -- -- -- -- 100 % -- --  02/19/20 1235 -- -- -- -- -- 100 % -- --  02/19/20 1234 (!) 140/91 -- -- 79 -- -- -- --  02/19/20 1218 (!) 146/93 98.9 F (37.2 C) Oral 74 18 100 % 5\' 5"  (1.651 m) 84.5 kg    Physical Exam  Nursing note and vitals reviewed. Constitutional: She is oriented to person, place, and time. She appears well-developed and well-nourished.  Cardiovascular: Normal rate and regular rhythm.  Respiratory: Effort normal and breath sounds normal.  GI:  Gravid  Neurological: She is alert and oriented to person, place, and time.  Skin: Skin is warm and dry.  Psychiatric: She has a normal mood and  affect. Her behavior is normal. Judgment and thought content normal.    Prenatal labs: ABO, Rh: AB/Positive/-- (12/07 1627) Antibody: Negative (12/07 1627) Rubella: 1.87 (12/07 1627) RPR: Non Reactive (03/17 0839)  HBsAg: Negative (12/07 1627)  HIV: Non Reactive (03/17 0839)  GBS:   POS  Assessment/Plan: --29 y.o. G1P0000 at [redacted]w[redacted]d  --Cat I tracing --GBS +, PCN allergy, Vancomycin ordered --Elevated BP in setting of CHTN (not on meds) --PEC labs normal --Headache and visual disturbances resolved with headache cocktail --FT/thick/posterior. Foley balloon attempted, not successful --Cytotec #1 placed PV at 1550 --Attempt foley placement in 4 hours with next check  --Anticipate NSVD  --Planning epidural --boy/desires circ/both/Nexplanon  Darlina Rumpf, CNM 02/19/2020, 4:08 PM

## 2020-02-20 ENCOUNTER — Inpatient Hospital Stay (HOSPITAL_COMMUNITY): Payer: Medicaid Other | Admitting: Anesthesiology

## 2020-02-20 LAB — CBC
HCT: 37.3 % (ref 36.0–46.0)
Hemoglobin: 12.6 g/dL (ref 12.0–15.0)
MCH: 25 pg — ABNORMAL LOW (ref 26.0–34.0)
MCHC: 33.8 g/dL (ref 30.0–36.0)
MCV: 74.2 fL — ABNORMAL LOW (ref 80.0–100.0)
Platelets: 347 10*3/uL (ref 150–400)
RBC: 5.03 MIL/uL (ref 3.87–5.11)
RDW: 15.4 % (ref 11.5–15.5)
WBC: 16.8 10*3/uL — ABNORMAL HIGH (ref 4.0–10.5)
nRBC: 0 % (ref 0.0–0.2)

## 2020-02-20 LAB — RPR: RPR Ser Ql: NONREACTIVE

## 2020-02-20 MED ORDER — EPHEDRINE 5 MG/ML INJ: 10.0000 mg | INTRAVENOUS | Status: DC | PRN

## 2020-02-20 MED ORDER — PHENYLEPHRINE 40 MCG/ML (10ML) SYRINGE FOR IV PUSH (FOR BLOOD PRESSURE SUPPORT): 80.0000 ug | PREFILLED_SYRINGE | INTRAVENOUS | Status: DC | PRN

## 2020-02-20 MED ORDER — DIPHENHYDRAMINE HCL 50 MG/ML IJ SOLN: 12.5000 mg | INTRAMUSCULAR | Status: DC | PRN

## 2020-02-20 MED ORDER — LIDOCAINE HCL (PF) 1 % IJ SOLN
INTRAMUSCULAR | Status: DC | PRN
  Administered 2020-02-20: 6 mL via EPIDURAL

## 2020-02-20 MED ORDER — TERBUTALINE SULFATE 1 MG/ML IJ SOLN: 0.2500 mg | Freq: Once | INTRAMUSCULAR | Status: DC | PRN

## 2020-02-20 MED ORDER — FENTANYL-BUPIVACAINE-NACL 0.5-0.125-0.9 MG/250ML-% EP SOLN
12.0000 mL/h | EPIDURAL | Status: DC | PRN
  Filled 2020-02-20: qty 250

## 2020-02-20 MED ORDER — LACTATED RINGERS IV SOLN: 500.0000 mL | Freq: Once | INTRAVENOUS | Status: DC

## 2020-02-20 MED ORDER — LACTATED RINGERS AMNIOINFUSION: INTRAVENOUS | Status: DC

## 2020-02-20 MED ORDER — SODIUM CHLORIDE (PF) 0.9 % IJ SOLN
INTRAMUSCULAR | Status: DC | PRN
  Administered 2020-02-20: 12 mL/h via EPIDURAL

## 2020-02-20 MED ORDER — OXYTOCIN 40 UNITS IN NORMAL SALINE INFUSION - SIMPLE MED
1.0000 m[IU]/min | INTRAVENOUS | Status: DC
  Administered 2020-02-20 (×2): 2 m[IU]/min via INTRAVENOUS
  Administered 2020-02-20: 6 m[IU]/min via INTRAVENOUS
  Filled 2020-02-20: qty 1000

## 2020-02-20 MED ORDER — FENTANYL-BUPIVACAINE-NACL 0.5-0.125-0.9 MG/250ML-% EP SOLN
12.0000 mL/h | EPIDURAL | Status: DC | PRN
Start: 2020-02-20 — End: 2020-02-20

## 2020-02-20 NOTE — Progress Notes (Signed)
Labor Progress Note Janice Duarte is a 29 y.o. G1P0000 at [redacted]w[redacted]d presented for IOL for worsening CHTN  S:  Feeling regular ctx, declines pain meds.   O:  BP (!) 135/96   Pulse 81   Temp 98.8 F (37.1 C) (Oral)   Resp 17   Ht 5\' 5"  (1.651 m)   Wt 84.5 kg   LMP 05/26/2019 (Exact Date)   SpO2 99%   BMI 30.99 kg/m  EFM: baseline 140 bpm/ mod variability/ + accels/ no decels  Toco/IUPC: 1-4 SVE: Dilation: 5 Effacement (%): 70 Cervical Position: Posterior Station: -3 Presentation: Vertex Exam by:: Devlyn Parish, CNM  Pitocin: 16 mu/min  A/P: 29 y.o. G1P0000 [redacted]w[redacted]d  1. Labor: latent 2. FWB: Cat I 3. Pain: meds/epidural per request 4. CHTN: stable, no severe range  Continue Pitocin titration, consider AROM. Anticipate labor progress and SVD.  [redacted]w[redacted]d, CNM 10:12 AM

## 2020-02-20 NOTE — Anesthesia Preprocedure Evaluation (Signed)
Anesthesia Evaluation  Patient identified by MRN, date of birth, ID band Patient awake    Reviewed: Allergy & Precautions, H&P , NPO status , Patient's Chart, lab work & pertinent test results, reviewed documented beta blocker date and time   Airway Mallampati: II  TM Distance: >3 FB Neck ROM: full    Dental no notable dental hx. (+) Teeth Intact   Pulmonary neg pulmonary ROS,    Pulmonary exam normal breath sounds clear to auscultation       Cardiovascular hypertension, negative cardio ROS Normal cardiovascular exam Rhythm:regular Rate:Normal     Neuro/Psych negative neurological ROS  negative psych ROS   GI/Hepatic negative GI ROS, Neg liver ROS,   Endo/Other  Morbid obesity  Renal/GU negative Renal ROS  negative genitourinary   Musculoskeletal   Abdominal (+) + obese,   Peds  Hematology negative hematology ROS (+)   Anesthesia Other Findings   Reproductive/Obstetrics (+) Pregnancy                             Anesthesia Physical Anesthesia Plan  ASA: III  Anesthesia Plan: Epidural   Post-op Pain Management:    Induction:   PONV Risk Score and Plan:   Airway Management Planned:   Additional Equipment:   Intra-op Plan:   Post-operative Plan:   Informed Consent: I have reviewed the patients History and Physical, chart, labs and discussed the procedure including the risks, benefits and alternatives for the proposed anesthesia with the patient or authorized representative who has indicated his/her understanding and acceptance.       Plan Discussed with:   Anesthesia Plan Comments:         Anesthesia Quick Evaluation

## 2020-02-20 NOTE — Progress Notes (Signed)
Janice Duarte is a 29 y.o. G1P0000 at [redacted]w[redacted]d admitted for induction of labor due to worsening chronic hypertension.  Subjective: Resting comfortably, feeling contractions  Objective: BP 126/66   Pulse 79   Temp 98.2 F (36.8 C)   Resp 18   Ht 5\' 5"  (1.651 m)   Wt 84.5 kg   LMP 05/26/2019 (Exact Date)   SpO2 99%   BMI 30.99 kg/m  No intake/output data recorded.  FHT:  FHR: 125 bpm, variability: moderate,  accelerations:  Present,  decelerations:  Absent UC:   regular, every 2-3 minutes  SVE:   Dilation: 3.5 Effacement (%): 70 Station: -3 Exam by:: [email or web address], RN  Pitocin @ 12 mu/min  Labs: Lab Results  Component Value Date   WBC 12.7 (H) 02/19/2020   HGB 11.5 (L) 02/19/2020   HCT 34.6 (L) 02/19/2020   MCV 74.1 (L) 02/19/2020   PLT 336 02/19/2020    Assessment / Plan: 29 yo G1P0 at 38.4 EGA, here for IOL 2/2 worsening CHTN  Labor: S/p cytotec x1 and FB. On pitocin since 0143. Continue to titrate. Consider AROM when appropriate. Fetal Wellbeing:  Category I Pain Control:  per patient request Worsening CHTN: Pre-E labs normal, no severe features, Bps normotensive since 0330 I/D:  GBS positive, vancomycin as clinda resistant Anticipated MOD:  vaginal  Janice Farrelly L Kloe Oates DO OB Fellow, [facility] 02/20/2020, 5:33 AM

## 2020-02-20 NOTE — Progress Notes (Signed)
Labor Progress Note Janice Duarte is a 29 y.o. G1P0000 at [redacted]w[redacted]d presented for IOL for CHTN  S:  Feeling some ctx. Wants to progress her labor.   O:  BP (!) 123/93   Pulse 94   Temp 98.1 F (36.7 C) (Oral)   Resp 16   Ht 5\' 5"  (1.651 m)   Wt 84.5 kg   LMP 05/26/2019 (Exact Date)   SpO2 99%   BMI 30.99 kg/m  EFM: baseline 145 bpm/ mod variability/ + accels/ occ variable decels  Toco/IUPC: 2-5 SVE: 5/70/-2 Pitocin: 24 mu/min AROM: clear, abundant  A/P: 29 y.o. G1P0000 [redacted]w[redacted]d  1. Labor: latent 2. FWB: Cat II 3. Pain: analgesia/anesthesia prn  Consented for AROM for labor induction, pt tolerated well. Continue Pitocin titration. Anticipate labor progress and SVD.  [redacted]w[redacted]d, CNM 3:17 PM

## 2020-02-20 NOTE — Progress Notes (Signed)
Patient ID: Janice Duarte, female   DOB: Feb 09, 1991, 29 y.o.   MRN: [id number] Contractions spacing out  Vitals:   02/19/20 2040 02/19/20 2207 02/19/20 2249 02/20/20 0110  BP: (!) 153/88 138/75 (!) 143/73 (!) 153/88  Pulse: 75 68 66 71  Resp: 16 16 16 18   Temp:      TempSrc:      SpO2:      Weight:      Height:       FHR reassuring,  UCs irregular  .Dilation: 3 Effacement (%): 70, 60 Cervical Position: Posterior Station: Ballotable Presentation: Vertex Exam by:: [email or web address], RN  Foley out  Will start Pitocin augmentation

## 2020-02-20 NOTE — Progress Notes (Signed)
Labor Progress Note Janice Duarte is a 29 y.o. G1P0000 at [redacted]w[redacted]d presented for IOL for CHTN  S:  Starting to get more comfortable with epidural.   O:  BP (!) 146/98   Pulse 74   Temp 98.7 F (37.1 C) (Oral)   Resp 20   Ht 5\' 5"  (1.651 m)   Wt 84.5 kg   LMP 05/26/2019 (Exact Date)   SpO2 100%   BMI 30.99 kg/m  EFM: baseline 135 bpm/ mod variability/ + accels/ early decels  Toco/IUPC: indeterminable SVE: deferred Pitocin: 26 mu/min  A/P: 29 y.o. G1P0000 [redacted]w[redacted]d  1. Labor: active 2. FWB: Cat I 3. Pain: epidural 4. CHTN: stable  Continue Pitocin. Anticipate labor progress and SVD.  [redacted]w[redacted]d, CNM 7:44 PM

## 2020-02-20 NOTE — Anesthesia Procedure Notes (Addendum)
Epidural Patient location during procedure: OB Start time: 02/20/2020 7:04 PM End time: 02/20/2020 7:09 PM  Staffing Anesthesiologist: Bethena Midget, MD  Preanesthetic Checklist Completed: patient identified, IV checked, site marked, risks and benefits discussed, surgical consent, monitors and equipment checked, pre-op evaluation and timeout performed  Epidural Patient position: sitting Prep: DuraPrep and site prepped and draped Patient monitoring: continuous pulse ox and blood pressure Approach: midline Location: L3-L4 Injection technique: LOR air  Needle:  Needle type: Tuohy  Needle gauge: 17 G Needle length: 9 cm and 9 Needle insertion depth: 8 cm Catheter type: closed end flexible Catheter size: 19 Gauge Catheter at skin depth: 14 cm Test dose: negative  Assessment Events: blood not aspirated, injection not painful, no injection resistance, no paresthesia and negative IV test

## 2020-02-20 NOTE — Progress Notes (Signed)
Labor Progress Note Janice Duarte is a 29 y.o. G1P0000 at 12w4dpresented for IOL for CHTN  S:  Comfortable with epidural. Met patient and discussed plan.   O:  BP 126/77   Pulse 83   Temp 98.5 F (36.9 C) (Oral)   Resp 20   Ht '5\' 5"'$  (1.651 m)   Wt 84.5 kg   LMP 05/26/2019 (Exact Date)   SpO2 100%   BMI 30.99 kg/m  EFM: baseline 135, minimal to moderate variability, no accels, no decels TOCO: q579mDilation: 5 Effacement (%): 80 Cervical Position: Posterior Station: -2 Presentation: Vertex Exam by:: Dr. FaMarice Potter A/P: [age].o. G1P0000 3880w4dre for IOL for cHTN.  1. Labor: S/p FB, Cytotec, Pit and AROM. Pit turned off due to fetal heart tracing earlier with lates vs deep variables. IUPC placed anteriorly and FSE placed. Amnioinfusion started. MVU's not adequate currently, will need to restart Pit; plan to restart at 2130. Baby was LOP with exam. Placed into hands and knees and will continue position changes. Anticipate SVD. 2. FWB: Cat II; occasional moderate variability 3. Pain: epidural 4. CHTN: stable; not on medication during pregnancy. Pr/Cr and CMP stable on admission.  CheChauncey MannD 9:19 PM

## 2020-02-21 ENCOUNTER — Encounter (HOSPITAL_COMMUNITY): Payer: Self-pay | Admitting: Family Medicine

## 2020-02-21 ENCOUNTER — Other Ambulatory Visit (HOSPITAL_COMMUNITY): Payer: Medicaid Other

## 2020-02-21 DIAGNOSIS — O1092 Unspecified pre-existing hypertension complicating childbirth: Secondary | ICD-10-CM

## 2020-02-21 DIAGNOSIS — Z3A38 38 weeks gestation of pregnancy: Secondary | ICD-10-CM

## 2020-02-21 DIAGNOSIS — Z30017 Encounter for initial prescription of implantable subdermal contraceptive: Secondary | ICD-10-CM

## 2020-02-21 DIAGNOSIS — O99824 Streptococcus B carrier state complicating childbirth: Secondary | ICD-10-CM

## 2020-02-21 LAB — CBC WITH DIFFERENTIAL/PLATELET
Abs Immature Granulocytes: 0.1 10*3/uL — ABNORMAL HIGH (ref 0.00–0.07)
Basophils Absolute: 0 10*3/uL (ref 0.0–0.1)
Basophils Relative: 0 %
Eosinophils Absolute: 0 10*3/uL (ref 0.0–0.5)
Eosinophils Relative: 0 %
HCT: 33.8 % — ABNORMAL LOW (ref 36.0–46.0)
Hemoglobin: 11.4 g/dL — ABNORMAL LOW (ref 12.0–15.0)
Immature Granulocytes: 1 %
Lymphocytes Relative: 18 %
Lymphs Abs: 2.9 10*3/uL (ref 0.7–4.0)
MCH: 24.6 pg — ABNORMAL LOW (ref 26.0–34.0)
MCHC: 33.7 g/dL (ref 30.0–36.0)
MCV: 72.8 fL — ABNORMAL LOW (ref 80.0–100.0)
Monocytes Absolute: 0.7 10*3/uL (ref 0.1–1.0)
Monocytes Relative: 4 %
Neutro Abs: 12.7 10*3/uL — ABNORMAL HIGH (ref 1.7–7.7)
Neutrophils Relative %: 77 %
Platelets: 323 10*3/uL (ref 150–400)
RBC: 4.64 MIL/uL (ref 3.87–5.11)
RDW: 15.2 % (ref 11.5–15.5)
WBC: 16.4 10*3/uL — ABNORMAL HIGH (ref 4.0–10.5)
nRBC: 0 % (ref 0.0–0.2)

## 2020-02-21 MED ORDER — MEASLES, MUMPS & RUBELLA VAC IJ SOLR: 0.5000 mL | Freq: Once | INTRAMUSCULAR | Status: DC

## 2020-02-21 MED ORDER — DIPHENHYDRAMINE HCL 25 MG PO CAPS: 25.0000 mg | ORAL_CAPSULE | Freq: Four times a day (QID) | ORAL | Status: DC | PRN

## 2020-02-21 MED ORDER — ETONOGESTREL 68 MG ~~LOC~~ IMPL
68.0000 mg | DRUG_IMPLANT | Freq: Once | SUBCUTANEOUS | Status: AC
  Administered 2020-02-21: 68 mg via SUBCUTANEOUS
  Filled 2020-02-21: qty 1

## 2020-02-21 MED ORDER — SENNOSIDES-DOCUSATE SODIUM 8.6-50 MG PO TABS
2.0000 | ORAL_TABLET | ORAL | Status: DC
  Administered 2020-02-21: 2 via ORAL
  Filled 2020-02-21: qty 2

## 2020-02-21 MED ORDER — ONDANSETRON HCL 4 MG/2ML IJ SOLN: 4.0000 mg | INTRAMUSCULAR | Status: DC | PRN

## 2020-02-21 MED ORDER — TETANUS-DIPHTH-ACELL PERTUSSIS 5-2.5-18.5 LF-MCG/0.5 IM SUSP: 0.5000 mL | Freq: Once | INTRAMUSCULAR | Status: DC

## 2020-02-21 MED ORDER — ACETAMINOPHEN 325 MG PO TABS
650.0000 mg | ORAL_TABLET | Freq: Four times a day (QID) | ORAL | Status: DC | PRN
  Administered 2020-02-21 – 2020-02-22 (×3): 650 mg via ORAL
  Filled 2020-02-21 (×3): qty 2

## 2020-02-21 MED ORDER — LIDOCAINE HCL 1 % IJ SOLN
0.0000 mL | Freq: Once | INTRAMUSCULAR | Status: AC | PRN
  Administered 2020-02-21: 3 mL via INTRADERMAL
  Filled 2020-02-21: qty 20

## 2020-02-21 MED ORDER — ONDANSETRON HCL 4 MG PO TABS: 4.0000 mg | ORAL_TABLET | ORAL | Status: DC | PRN

## 2020-02-21 MED ORDER — BENZOCAINE-MENTHOL 20-0.5 % EX AERO: 1.0000 "application " | INHALATION_SPRAY | CUTANEOUS | Status: DC | PRN

## 2020-02-21 MED ORDER — SIMETHICONE 80 MG PO CHEW: 80.0000 mg | CHEWABLE_TABLET | ORAL | Status: DC | PRN

## 2020-02-21 MED ORDER — DIBUCAINE (PERIANAL) 1 % EX OINT: 1.0000 "application " | TOPICAL_OINTMENT | CUTANEOUS | Status: DC | PRN

## 2020-02-21 MED ORDER — WITCH HAZEL-GLYCERIN EX PADS: 1.0000 "application " | MEDICATED_PAD | CUTANEOUS | Status: DC | PRN

## 2020-02-21 MED ORDER — COCONUT OIL OIL: 1.0000 "application " | TOPICAL_OIL | Status: DC | PRN

## 2020-02-21 MED ORDER — AMLODIPINE BESYLATE 5 MG PO TABS
5.0000 mg | ORAL_TABLET | Freq: Every day | ORAL | Status: DC
  Administered 2020-02-21 – 2020-02-22 (×2): 5 mg via ORAL
  Filled 2020-02-21 (×2): qty 1

## 2020-02-21 MED ORDER — IBUPROFEN 600 MG PO TABS
600.0000 mg | ORAL_TABLET | Freq: Three times a day (TID) | ORAL | Status: DC | PRN
  Administered 2020-02-21 – 2020-02-22 (×5): 600 mg via ORAL
  Filled 2020-02-21 (×5): qty 1

## 2020-02-21 MED ORDER — PRENATAL MULTIVITAMIN CH
1.0000 | ORAL_TABLET | Freq: Every day | ORAL | Status: DC
  Administered 2020-02-21 – 2020-02-22 (×2): 1 via ORAL
  Filled 2020-02-21 (×2): qty 1

## 2020-02-21 NOTE — Anesthesia Postprocedure Evaluation (Signed)
Anesthesia Post Note  Patient: Janice Duarte  Procedure(s) Performed: AN AD HOC LABOR EPIDURAL     Patient location during evaluation: Mother Baby Anesthesia Type: Epidural Level of consciousness: awake Pain management: satisfactory to patient Vital Signs Assessment: post-procedure vital signs reviewed and stable Respiratory status: spontaneous breathing Cardiovascular status: stable Anesthetic complications: no    Last Vitals:  Vitals:   02/21/20 0615 02/21/20 0931  BP: 140/87 111/70  Pulse: 72 69  Resp: 16 18  Temp: 36.8 C 36.7 C  SpO2: 100% 100%    Last Pain:  Vitals:   02/21/20 1139  TempSrc:   PainSc: 6    Pain Goal:                   [employer]

## 2020-02-21 NOTE — Progress Notes (Signed)
Labor Progress Note Janice Duarte is a 29 y.o. G1P0000 at [redacted]w[redacted]d presented for IOL for CHTN  S:  Comfortable with epidural.   O:  BP 124/73   Pulse 69   Temp 98.3 F (36.8 C) (Oral)   Resp 18   Ht 5\' 5"  (1.651 m)   Wt 84.5 kg   LMP 05/26/2019 (Exact Date)   SpO2 100%   BMI 30.99 kg/m  EFM: baseline 120, moderate variability, no accels, prolonged decel after exam TOCO: q60m  Dilation: 6 Effacement (%): 90 Cervical Position: Anterior Station: 0 Presentation: Vertex Exam by:: [name]   A/P: 29 y.o. G1P0000 [redacted]w[redacted]d here for IOL for cHTN.  1. Labor: S/p FB, Cytotec, Pit and AROM. IUPC and FSE in place. Cont Amnioinfusion. Baby now feels LOT/LOA. MVU's nearing adequate, cont Pit titration. Anticipate SVD. 2. FWB: Cat II; reassuring for moderate variability and accels  3. Pain: epidural 4. CHTN: stable; not on medication during pregnancy. Pr/Cr and CMP stable on admission.  [redacted]w[redacted]d, MD 1:32 AM

## 2020-02-21 NOTE — Discharge Summary (Addendum)
Postpartum Discharge Summary    Patient Name: Janice Duarte DOB: 11-19-1990 MRN: [id number]  Date of admission: 02/19/2020 Delivery date:02/21/2020  Delivering provider: Chauncey Mann  Date of discharge: 02/22/2020  Admitting diagnosis: Chronic hypertension in pregnancy [O10.919] Intrauterine pregnancy: [redacted]w[redacted]d    Secondary diagnosis:  Active Problems:   GBS (group B streptococcus) UTI complicating pregnancy   Alpha thalassemia silent carrier   [place]   Chronic hypertension in pregnancy  Additional problems: None    Discharge diagnosis: Term Pregnancy Delivered and CHTN                                              Post partum procedures:None Augmentation: AROM, Pitocin, Cytotec and IP Foley Complications: None  Hospital course: Induction of Labor With Vaginal Delivery   29y.o. yo G1P0000 at 325w5das admitted to the hospital 02/19/2020 for induction of labor.  Indication for induction: cHTN.  Patient had an uncomplicated labor course as follows: Initial SVE: 0.5/thick/high. Patient received Cytotec, Foley balloon, Pitocin and AROM. Received epidural. Protracted latent phase. She then progressed to complete.  Membrane Rupture Time/Date: 3:15 PM ,02/20/2020   Delivery Method:Vaginal, Spontaneous  Episiotomy: None  Lacerations:  None  Details of delivery can be found in separate delivery note. BP's monitored post-partum and had some mildly elevated pressures. She was started on Norvasc.  Patient had an otherwise routine postpartum course. Patient is discharged home 02/22/20.  Newborn Data: Birth date:02/21/2020  Birth time:2:54 AM  Gender:Female  Living status:Living  Apgars:9 ,9  Weight:2353 g   Magnesium Sulfate received: No BMZ received: No Rhophylac:No MMR:No T-DaP:Given prenatally Flu: No Transfusion:No  Physical exam  Vitals:   02/21/20 2130 02/22/20 0606 02/22/20 0626 02/22/20 0648  BP: 118/72 131/90 (!) 145/76 139/88  Pulse: 77 78    Resp:  17 17    Temp: 98.6 F (37 C) 98 F (36.7 C)    TempSrc: Oral Oral    SpO2: 100% 100%    Weight:      Height:       Physical Exam completed by Dr. FaMarice PotterGeneral: alert, cooperative and no distress Lochia: normal flow Chest: CTAB Heart: RRR no m/r/g Abdomen: soft, +tender, fundus firm at/below umbilicus Uterine Fundus: firm DVT Evaluation: No evidence of DVT seen on physical exam. Extremities: very slight R leg edema  Labs: Lab Results  Component Value Date   WBC 16.4 (H) 02/21/2020   HGB 11.4 (L) 02/21/2020   HCT 33.8 (L) 02/21/2020   MCV 72.8 (L) 02/21/2020   PLT 323 02/21/2020   CMP Latest Ref Rng & Units 02/19/2020  Glucose 70 - 99 mg/dL 166(H)  BUN 6 - 20 mg/dL 7  Creatinine 0.44 - 1.00 mg/dL 0.71  Sodium 135 - 145 mmol/L 133(L)  Potassium 3.5 - 5.1 mmol/L 4.1  Chloride 98 - 111 mmol/L 102  CO2 22 - 32 mmol/L 20(L)  Calcium 8.9 - 10.3 mg/dL 8.8(L)  Total Protein 6.5 - 8.1 g/dL 6.3(L)  Total Bilirubin 0.3 - 1.2 mg/dL 0.5  Alkaline Phos 38 - 126 U/L 90  AST 15 - 41 U/L 18  ALT 0 - 44 U/L 11   Edinburgh Score: Edinburgh Postnatal Depression Scale Screening Tool 02/22/2020  I have been able to laugh and see the funny side of things. 0  I have looked forward  with enjoyment to things. 0  I have blamed myself unnecessarily when things went wrong. 0  I have been anxious or worried for no good reason. 2  I have felt scared or panicky for no good reason. 0  Things have been getting on top of me. 1  I have been so unhappy that I have had difficulty sleeping. 0  I have felt sad or miserable. 0  I have been so unhappy that I have been crying. 1  The thought of harming myself has occurred to me. 0  Edinburgh Postnatal Depression Scale Total 4     After visit meds:  Allergies as of 02/22/2020      Reactions   Penicillins Hives   Has patient had a PCN reaction causing immediate rash, facial/tongue/throat swelling, SOB or lightheadedness with hypotension: Yes Has  patient had a PCN reaction causing severe rash involving mucus membranes or skin necrosis: Yes Has patient had a PCN reaction that required hospitalization No Has patient had a PCN reaction occurring within the last 10 years: Yes  If all of the above answers are "NO", then may proceed with Cephalosporin use.   Morphine And Related Rash   Bactrim [sulfamethoxazole-trimethoprim] Hives      Medication List    STOP taking these medications   acetaminophen 500 MG tablet Commonly known as: TYLENOL   aspirin EC 81 MG tablet   cyclobenzaprine 10 MG tablet Commonly known as: FLEXERIL   loratadine 10 MG tablet Commonly known as: CLARITIN   Misc. Devices Misc     TAKE these medications   amLODipine 5 MG tablet Commonly known as: NORVASC Take 1 tablet (5 mg total) by mouth daily. Start taking on: Feb 23, 2020   Blood Pressure Kit Devi 1 Device by Does not apply route daily. ICD 10: Z34.00   ibuprofen 600 MG tablet Commonly known as: ADVIL Take 1 tablet (600 mg total) by mouth every 8 (eight) hours as needed for mild pain.   PRENATAL GUMMIES/DHA & FA PO Take 2 tablets by mouth daily.        Discharge home in stable condition Infant Feeding: Bottle and Breast Infant Disposition:home with mother Discharge instruction: per After Visit Summary and Postpartum booklet. Activity: Advance as tolerated. Pelvic rest for 6 weeks.  Diet: routine diet Future Appointments: Future Appointments  Date Time Provider [place]  02/29/2020  9:00 AM [facility] NURSE [facility]  03/22/2020 10:55 AM Cherre Blanc, MD [facility]   Follow up Visit:  Please schedule this patient for a Virtual postpartum visit in 4 weeks with the following provider: Any provider. Additional Postpartum F/U:BP check 1 week  High risk pregnancy complicated by: HTN Delivery mode:  Vaginal, Spontaneous  Anticipated Birth Control:  Nexplanon   02/22/2020 Stark Klein, MD  GME ATTESTATION:  I saw  and evaluated the patient. I agree with the findings and the plan of care as documented in the resident's note.  Merilyn Baba, DO OB Fellow, [place] for [place] 02/22/2020 2:13 PM

## 2020-02-21 NOTE — Lactation Note (Signed)
This note was copied from a baby's chart. Lactation Consultation Note  Patient Name: Janice Duarte [id number] Date: 02/21/2020 Reason for consult: Initial assessment;Early term 37-38.6wks;Infant < 6lbs;Primapara;1st time breastfeeding  P1 mother whose infant is now 95 hours old.  This is an ETI at 38+5 weeks weighing < 6 lbs.  Mother's feeding preference on admission was breast/bottle.  Baby was swaddled and asleep in the bassinet when I arrived.  Mother stated that she has tried to breast feed him but he was not interested.  Reviewed the "ETI" and the probabllity of a sleepy baby especially during the first 24 hours after birth.  Encouraged her to call her RN/LC for latch assistance with the next feeding.  Discussed feeding cues, STS, how to awaken a sleepy baby and hand expression.  Mother has been leaking a little bit of colostrum intermittently.  Colostrum container provided and milk storage times reviewed.  Finger feeding demonstrated.    Mom made aware of O/P services, breastfeeding support groups, community resources, and our phone # for post-discharge questions.  Mother does not have a DEBP for home use.  She is a [facility] participant in [employer].  Referral faxed.  No support person present at this time.   Maternal Data Formula Feeding for Exclusion: Yes Reason for exclusion: Mother's choice to formula and breast feed on admission Has patient been taught Hand Expression?: Yes Does the patient have breastfeeding experience prior to this delivery?: No  Feeding Feeding Type: Bottle Fed - Formula Nipple Type: Extra Slow Flow  LATCH Score                   Interventions    Lactation Tools Discussed/Used WIC Program: Yes   Consult Status Consult Status: Follow-up Date: 02/22/20 Follow-up type: In-patient    Janice Duarte 02/21/2020, 9:32 AM

## 2020-02-21 NOTE — Procedures (Signed)
  No contraindications for placement.  No liver disease, no unexplained vaginal bleeding, no h/o breast cancer, no h/o blood clots.   Risks & benefits of Nexplanon discussed Packaging instructions supplied to patient Consent form signed  No current facility-administered medications on file prior to encounter.   Current Outpatient Medications on File Prior to Encounter  Medication Sig Dispense Refill  . aspirin EC 81 MG tablet Take 1 tablet (81 mg total) by mouth daily. 100 tablet 2  . Prenatal MV-Min-FA-Omega-3 (PRENATAL GUMMIES/DHA & FA PO) Take 2 tablets by mouth daily.    Marland Kitchen acetaminophen (TYLENOL) 500 MG tablet Take 500 mg by mouth every 6 (six) hours as needed for mild pain.    . Blood Pressure Monitoring (BLOOD PRESSURE KIT) DEVI 1 Device by Does not apply route daily. ICD 10: Z34.00 1 each 0  . cyclobenzaprine (FLEXERIL) 10 MG tablet TAKE 1 TABLET (10 MG TOTAL) BY MOUTH EVERY 8 (EIGHT) HOURS AS NEEDED FOR MUSCLE SPASMS. (Patient not taking: Reported on 02/07/2020) 30 tablet 1  . loratadine (CLARITIN) 10 MG tablet Take 1 tablet (10 mg total) by mouth daily. (Patient not taking: Reported on 02/07/2020) 30 tablet 0  . Misc. Devices MISC Dispense one maternity belt for patient 1 each 0    The patient denies any allergies to anesthetics or antiseptics.  Patient Active Problem List   Diagnosis Date Noted  . Chronic hypertension in pregnancy 02/19/2020  . Alpha thalassemia silent carrier 02/08/2020  . Sickle cell trait ([place]) 02/08/2020  . Allergic rhinitis 12/28/2019  . Substance use 12/20/2019  . Hidradenitis suppurativa   . GBS (group B streptococcus) UTI complicating pregnancy 25/01/3975  . Supervision of high-risk pregnancy 08/29/2019  . E. coli UTI (urinary tract infection) 08/07/2019  . Chronic hypertension affecting pregnancy 08/03/2019    Procedure: Pt was placed in supine position. Left arm was flexed at the elbow and externally rotated so that her wrist was parallel to  her ear The medial epicondyle of the left arm was identified The insertions site was marked 8 cm proximal to the medial epicondyle The insertion site was cleaned with Betadine The area surrounding the insertion site was covered with a sterile drape 1% lidocaine was injected just under the skin at the insertion site extending 4 cm proximally. The sterile preloaded disposable Nexaplanon applicator was removed from the sterile packaging The applicator needle was inserted at a 30 degree angle at 8 cm proximal to the medial epicondyle as marked The applicator was lowered to a horizontal position and advanced just under the skin for the full length of the needle The slider on the applicator was retracted fully while the applicator remained in the same position, then the applicator was removed. The implant was confirmed via palpation as being in position The implant position was demonstrated to the patient Pressure dressing was applied to the patient.  The patient was instructed to removed the pressure dressing in 24 hrs.  The patient was advised to move slowly from a supine to an upright position  The patient denied any concerns or complaints  Matilde Haymaker, MD Family Medicine Resident PGY-2

## 2020-02-22 ENCOUNTER — Other Ambulatory Visit: Payer: Medicaid Other

## 2020-02-22 ENCOUNTER — Encounter: Payer: Medicaid Other | Admitting: Family Medicine

## 2020-02-22 ENCOUNTER — Encounter: Payer: Medicaid Other | Admitting: Obstetrics and Gynecology

## 2020-02-22 MED ORDER — AMLODIPINE BESYLATE 5 MG PO TABS: 5.0000 mg | ORAL_TABLET | Freq: Every day | ORAL | 0 refills | Status: DC

## 2020-02-22 MED ORDER — IBUPROFEN 600 MG PO TABS: 600.0000 mg | ORAL_TABLET | Freq: Three times a day (TID) | ORAL | 0 refills | Status: DC | PRN

## 2020-02-22 MED FILL — IBUPROFEN 600 MG TABLET: 600 | 10 days supply | Qty: 30 | Fill #0

## 2020-02-22 MED FILL — AMLODIPINE BESYLATE 5 MG TA: 5 | 30 days supply | Qty: 30 | Fill #0

## 2020-02-22 NOTE — Progress Notes (Addendum)
Post Partum Day 1  Subjective:  Janice Duarte is a 29 y.o. G1P1001 [redacted]w[redacted]d s/p VD.  No acute events overnight.  Pt denies problems with ambulating, voiding or po intake.  She denies nausea or vomiting.  Pain is moderately controlled, cramping is a 6/10 on advil and reports headache, believed to be due to lack of sleep.  She has had flatus. She has not had bowel movement.  Lochia moderate; lighter than period and improving.  Plan for birth control is Nexplanon, placed yesterday.  Method of Feeding: both breast and bottle.   Objective: BP 139/88 (BP Location: Right Arm)   Pulse 78   Temp 98 F (36.7 C) (Oral)   Resp 17   Ht 5\' 5"  (1.651 m)   Wt 84.5 kg   LMP 05/26/2019 (Exact Date)   SpO2 100%   Breastfeeding Unknown   BMI 30.99 kg/m   Physical Exam:  General: alert, cooperative and no distress Lochia: normal flow Chest: CTAB Heart: RRR no m/r/g Abdomen: soft, +tender, fundus firm at/below umbilicus Uterine Fundus: firm DVT Evaluation: No evidence of DVT seen on physical exam. Extremities: very slight R leg edema  Recent Labs    02/20/20 1837 02/21/20 0333  HGB 12.6 11.4*  HCT 37.3 33.8*    Assessment/Plan:  ASSESSMENT: Janice Duarte is a 29 y.o. G1P1001 [redacted]w[redacted]d ppd #1 s/p NSVD doing well.   Discharge home after circ.    LOS: 3 days   Alexa Holloway 02/22/2020, 8:05 AM   I saw and evaluated the patient. I agree with the findings and the plan of care as documented in the student's note. Cont Norvasc. S/p Nexplanon. Okay to discharge today if baby can; RN to page team for orders.  02/24/2020, MD [facility] Family Medicine Fellow, [facility] for [facility]

## 2020-02-22 NOTE — Clinical Social Work Maternal (Signed)
CLINICAL SOCIAL WORK MATERNAL/CHILD NOTE  Patient Details  Name: Janice Duarte MRN: [id number] Date of Birth: [birth year]  Date:  02/22/2020  Clinical Social Worker Initiating Note:  Durward Fortes, LCSW Date/Time: Initiated:  02/22/20/0920     Child's Name:  Janice Duarte   Biological Parents:  Mother(Saul Wynetta Emery)   Need for Interpreter:  None   Reason for Referral:  Current Substance Use/Substance Use During Pregnancy    Address:  [place] [address]    Phone number:  [phone] (home)     Additional phone number: none   Household Members/Support Persons (HM/SP):   Household Member/Support Person 1   HM/SP Name Relationship DOB or Age  HM/SP -1 Querida Eliasen MOB   29 years old   HM/SP -2   Tameika  Aunt     HM/SP -3        HM/SP -4        HM/SP -5        HM/SP -6        HM/SP -7        HM/SP -8          Natural Supports (not living in the home):  Friends   Professional Supports: None   Employment: Animator   Type of Work: Physicist, medical   Education:  [employer] school graduate   Homebound arranged:  n/a  Museum/gallery curator Resources:  Medicaid   Other Resources:  Physicist, medical , Emily Considerations Which May Impact Care:  none reported.   Strengths:  Ability to meet basic needs , Compliance with medical plan , Home prepared for child , Pediatrician chosen   Psychotropic Medications:       None   Pediatrician:    [place]  Pediatrician List:   [facility] for [place]      Pediatrician Fax Number:    Risk Factors/Current Problems:  Substance Use    Cognitive State:  Able to Concentrate , Insightful , Alert    Mood/Affect:  Comfortable , Calm , Relaxed    CSW Assessment: CSW consulted as MOB reported THC use during pregnancy. CSW went to speak with MOB at bedside to address  further needs.   CSW congratulated MOB on the birth of infant. CSW advised MOB of CSWs' role and the reason for CSW coming to visit with her. MOB reported that before she was pregnant "I smoked on the regular". MOB reported that once she found out that she was pregnant she lowed use down to "smoking two a day". MOB reported that eventually she stopped all together. MOB reported that when she did use during pregnancy her reason was unable tot eat. MOB reported that once she stopped "I was unable to eat again. CSW understanding of this and advised MOB of the hospital drug screen polciy. MOB was advised that infants UDS is negative, therefore CSW would continue to monitor infants CDS and make CPS report if warranted. MOB reported that she understood this and reported no further questions.   CSW inquired from [facility] on her mental health hx. MOB reported that she has no mental health hx and denies SI, HI and DV at this time. MOB reported that her support is child's Godmother Community education officer). MOB reported that she has [place] and [employer] and reported no other needs to CSW.   CSW  provided MOB with PPD and SIDS education. MOB was given PPD Checklist in order to keep track of feelings as they relate to PPD. CSW will continue to monitor infants CDS and make report if warranted.   CSW Plan/Description:  No Further Intervention Required/No Barriers to Discharge, Sudden Infant Death Syndrome (SIDS) Education, Perinatal Mood and Anxiety Disorder (PMADs) Education, CSW Will Continue to Monitor Umbilical Cord Tissue Drug Screen Results and Make Report if [facility] Drug Screen Policy Information    Loralie Champagne 02/22/2020, 10:33 AM

## 2020-02-22 NOTE — Discharge Instructions (Signed)

## 2020-02-22 NOTE — Lactation Note (Signed)
This note was copied from a baby's chart. Lactation Consultation Note  Patient Name: Janice Duarte Date: 02/22/2020 Reason for consult: Follow-up assessment;Early term 37-38.6wks;Infant < 6lbs Baby is 45 hours old.  He is currently in the nursery for circumcision.  Mom called out for assist with pumping.  She states she has not pumped yet but has done hand expression and spoon fed baby colostrum.  Mom states baby is not latching.  Instructed on symphony pump.  Small drops of colostrum noted.  Instructed to call out for latch assist and pump/hand express every 3 hours.  Mom verbalizes understanding. She has left a message with [facility] to obtain a pump.  Maternal Data    Feeding    LATCH Score                   Interventions    Lactation Tools Discussed/Used Pump Review: Setup, frequency, and cleaning Initiated by:: lmoulden Date initiated:: 02/22/20   Consult Status Consult Status: Follow-up Date: 02/23/20 Follow-up type: In-patient    Huston Foley 02/22/2020, 11:02 AM

## 2020-02-23 ENCOUNTER — Inpatient Hospital Stay (HOSPITAL_COMMUNITY): Payer: Medicaid Other

## 2020-02-23 ENCOUNTER — Inpatient Hospital Stay (HOSPITAL_COMMUNITY): Admission: AD | Admit: 2020-02-23 | Payer: Medicaid Other | Source: Home / Self Care | Admitting: Family Medicine

## 2020-02-24 ENCOUNTER — Telehealth: Payer: Self-pay

## 2020-02-24 NOTE — Telephone Encounter (Signed)
Pt reports that she is having some swelling in her lower extremities.  Per chart review pt just had a vaginal delivery on 02/21/20.  I explained to the pt that is normal to retain fluid after recently delivery.  I also advised pt that it can take up to a couple weeks before she sees a change in her swelling.  Pt states that she does take BP medication and she does not have any issues with sx's of HTN.  I advised pt to make sure that she continues taking her BP medication as prescribed, elevate her feet above the heart, and to drink plenty of water.  I informed pt that we can evaluate her swelling at her nurse visit on 02/29/20 for BP check.  Pt verbalized understanding.    Addison Naegeli, RN  02/24/20

## 2020-02-29 ENCOUNTER — Telehealth: Payer: Self-pay | Admitting: Clinical

## 2020-02-29 ENCOUNTER — Other Ambulatory Visit: Payer: Self-pay

## 2020-02-29 ENCOUNTER — Ambulatory Visit (INDEPENDENT_AMBULATORY_CARE_PROVIDER_SITE_OTHER): Payer: Medicaid Other | Admitting: *Deleted

## 2020-02-29 VITALS — BP 132/99 | HR 87

## 2020-02-29 DIAGNOSIS — O1003 Pre-existing essential hypertension complicating the puerperium: Secondary | ICD-10-CM

## 2020-02-29 DIAGNOSIS — Z1331 Encounter for screening for depression: Secondary | ICD-10-CM

## 2020-02-29 DIAGNOSIS — I10 Essential (primary) hypertension: Secondary | ICD-10-CM

## 2020-02-29 MED ORDER — LABETALOL HCL 200 MG PO TABS: 200.0000 mg | ORAL_TABLET | Freq: Two times a day (BID) | ORAL | 0 refills | Status: DC

## 2020-02-29 NOTE — Progress Notes (Signed)
Here for bp check. Was IOL for [facility] Vaginal delivery 02/21/20. D/c on norvasc 5mg . Called 5/28 with c/o edema.  Ordered bp check.  Today bp elevated. Edema of feet/ankles trace.  C/o intermittent headaches about every other day and has one today =8. States ibuprofen eases but does not relieve ha.  Discussed with Dr.Williams and will change to labetolol and have come back 1 week for bp check.Advised to call 6/28 if needed; but if has severe headache or severe edema to go to hospital.  Janice Duarte voices understanding. Janice Bognar,RN

## 2020-02-29 NOTE — Addendum Note (Signed)
Addended by: Hulda Marin C on: 02/29/2020 12:44 PM   Modules accepted: Orders

## 2020-02-29 NOTE — Telephone Encounter (Signed)
Left HIPPA-compliant message to call back Thurmond Hildebran from [facility] at [phone] (main office) or [phone] (Council M[place]uia's office).   

## 2020-03-06 ENCOUNTER — Telehealth: Payer: Self-pay

## 2020-03-06 NOTE — Telephone Encounter (Signed)
Received message from Suszanne Finch, RN from [place] today at 4:13p thru the [employer]. Stating this pt delivered on 02/21/20 & she spoke with Pt today and she scored a 13 on the [place], she stated that she wanted to refer pt to [employer]. Pt declined, stating that she rather see some one at [facility]. Pt has a Nurse visit for  BP check on 03/07/20,Pt states rather see some one then.

## 2020-03-06 NOTE — BH Specialist Note (Signed)
error 

## 2020-03-07 ENCOUNTER — Other Ambulatory Visit: Payer: Self-pay

## 2020-03-07 ENCOUNTER — Ambulatory Visit (INDEPENDENT_AMBULATORY_CARE_PROVIDER_SITE_OTHER): Payer: Medicaid Other | Admitting: Clinical

## 2020-03-07 ENCOUNTER — Ambulatory Visit: Payer: Medicaid Other

## 2020-03-07 ENCOUNTER — Ambulatory Visit: Payer: Medicaid Other | Admitting: Clinical

## 2020-03-07 ENCOUNTER — Ambulatory Visit (INDEPENDENT_AMBULATORY_CARE_PROVIDER_SITE_OTHER): Payer: Medicaid Other | Admitting: General Practice

## 2020-03-07 VITALS — BP 127/82 | HR 110 | Ht 65.0 in | Wt 161.0 lb

## 2020-03-07 DIAGNOSIS — O10919 Unspecified pre-existing hypertension complicating pregnancy, unspecified trimester: Secondary | ICD-10-CM

## 2020-03-07 DIAGNOSIS — F4323 Adjustment disorder with mixed anxiety and depressed mood: Secondary | ICD-10-CM

## 2020-03-07 NOTE — BH Specialist Note (Signed)
Integrated Behavioral Health Initial Visit  MRN: [id number] Name: Janice Duarte  Number of Integrated Behavioral Health Clinician visits:: 1/6 Session Start time: 1:37  Session End time: 1:54 Total time: 17  Type of Service: Integrated Behavioral Health- Individual/Family Interpretor:No. Interpretor Name and Language: n/a   Warm Hand Off Completed.       SUBJECTIVE: Janice Duarte is a 29 y.o. female accompanied by n/a Patient was referred by Raynelle Dick, MD for positive depression screen. Patient reports the following symptoms/concerns: Pt states her primary concern today is depression, fatigue, poor appetite and worry, attributes to adjusting to new motherhood; has good support at home.  Duration of problem: Postpartum; Severity of problem: moderate  OBJECTIVE: Mood: Depressed and Affect: Appropriate Risk of harm to self or others: No plan to harm self or others  LIFE CONTEXT: Family and Social: Pt has two-week-old son; family has been suportive School/Work: - Self-Care: - Life Changes: Recent childbirth  GOALS ADDRESSED: Patient will: 1. Reduce symptoms of: anxiety, depression and stress 2. Increase knowledge and/or ability of: healthy habits  3. Demonstrate ability to: Increase healthy adjustment to current life circumstances  INTERVENTIONS: Interventions utilized: Functional Assessment of ADLs and Psychoeducation and/or Health Education  Standardized Assessments completed: GAD-7 and PHQ 9  ASSESSMENT: Patient currently experiencing Adjustment disorder with mixed anxiety and depressed mood.   Patient may benefit from psychoeducation and brief therapeutic interventions regarding coping with symptoms of anxiety, depression.  Marland Kitchen  PLAN: 1. Follow up with behavioral health clinician on : 2-3 weeks; Call Naina Sleeper at [phone] if needed prior to follow up visit 2. Behavioral recommendations:  -Continue taking prenatal vitamin daily until postpartum medical  visit; discuss with medical provider at that time -Continue allowing family to offer practical support daily -Continue sleeping when baby sleeps, as much as able, until postpartum medical visit -Consider eating a few bites of food at least every 5 hours during the day while breastfeeding   3. Referral(s): Integrated [facility] (In Clinic)  [address] [place], [place]  Depression screen [facility] 2/9 03/07/2020 02/29/2020 02/15/2020 02/08/2020 01/25/2020  Decreased Interest 2 2 2  0 2  Down, Depressed, Hopeless 3 3 1 2 1   PHQ - 2 Score 5 5 3 2 3   Altered sleeping 1 0 1 3 2   Tired, decreased energy 2 3 2 3 2   Change in appetite 0 1 0 0 1  Feeling bad or failure about yourself  2 3 0 0 0  Trouble concentrating 0 0 0 0 0  Moving slowly or fidgety/restless 0 2 0 0 0  Suicidal thoughts 0 0 0 0 0  PHQ-9 Score 10 14 6 8 8   Some recent data might be hidden   GAD 7 : Generalized Anxiety Score 03/07/2020 02/29/2020 02/15/2020 02/08/2020  Nervous, Anxious, on Edge 2 0 2 2  Control/stop worrying 3 3 2 1   Worry too much - different things 3 3 1 1   Trouble relaxing 2 3 3 3   Restless 2 1 1 1   Easily annoyed or irritable 2 3 3 1   Afraid - awful might happen 0 0 0 0  Total GAD 7 Score 14 13 12  9

## 2020-03-07 NOTE — Patient Instructions (Addendum)
 /  Emotional Wellbeing Apps and Websites Here are a few free apps meant to help you to help yourself.  To find, try searching on the internet to see if the app is offered on Apple/Android devices. If your first choice doesn't come up on your device, the good news is that there are many choices! Play around with different apps to see which ones are helpful to you.    Calm This is an app meant to help increase calm feelings. Includes info, strategies, and tools for tracking your feelings.      Calm Harm  This app is meant to help with self-harm. Provides many 5-minute or 15-min coping strategies for doing instead of hurting yourself.       Healthy Minds Health Minds is a problem-solving tool to help deal with emotions and cope with stress you encounter wherever you are.      MindShift This app can help people cope with anxiety. Rather than trying to avoid anxiety, you can make an important shift and face it.      MY3  MY3 features a support system, safety plan and resources with the goal of offering a tool to use in a time of need.       My Life My Voice  This mood journal offers a simple solution for tracking your thoughts, feelings and moods. Animated emoticons can help identify your mood.       Relax Melodies Designed to help with sleep, on this app you can mix sounds and meditations for relaxation.      Smiling Mind Smiling Mind is meditation made easy: it's a simple tool that helps put a smile on your mind.        Stop, Breathe & Think  A friendly, simple guide for people through meditations for mindfulness and compassion.  Stop, Breathe and Think Kids Enter your current feelings and choose a "mission" to help you cope. Offers videos for certain moods instead of just sound recordings.       Team Orange The goal of this tool is to help teens change how they think, act, and react. This app helps you focus on your own good feelings and experiences.      The [employer] Box The [employer] Box (VHB) contains simple tools to help patients with coping, relaxation, distraction, and positive thinking.    [facility] for [facility] [address] [place], [place] [address] [phone] (main office) [phone] (Codie Hainer's office)

## 2020-03-07 NOTE — Progress Notes (Signed)
Patient presents to office today for BP check following recent SVD on 5/25. Patient was in office last week on 6/2 and had elevated BP. Norvasc was discontinued and Labetalol 200mg  BID was sent to pharmacy. Patient reports her pharmacy never received the prescription so she has continued taking the Norvasc every morning. She reports a headache every few days, most recently one yesterday that lasted the whole day. She does report seeing "white dots" with her headaches. Denies blurry vision or dizziness. Tylenol/motrin helps minimally. Patient reports a BP 175/95 on Sunday. She states she has been tired, stressed/depressed recently and that causes her BP to be high. Patient does have upcoming appt with Wednesday. Patient states she does have normal BP at home at times so she feels confident cuff is working properly. BP today is 127/82. Discussed with Dr Asher Muir who recommends magnesium oxide 400mg  daily for headaches and to start Labetalol Rx and discontinue Norvasc. Patient also needs CBC & CMET. Discussed with patient. Called [facility] & they have prescription ready- patient informed. Patient reports having hemorrhoids and no bowel movement in several days. Discussed smooth move tea, colace BID, or Miralax with patient. Patient verbalized understanding. Patient reports difficulty with milk supply- she will come back and see Lactation for an appt. Patient will return in 1 week for repeat BP check.   Patient left office without having labs drawn. Called patient and she has an appt in the office tomorrow with Lactation, so labs will be drawn then.   Vergie Living RN BSN 03/07/20

## 2020-03-08 ENCOUNTER — Other Ambulatory Visit: Payer: Medicaid Other

## 2020-03-08 DIAGNOSIS — O10919 Unspecified pre-existing hypertension complicating pregnancy, unspecified trimester: Secondary | ICD-10-CM

## 2020-03-09 LAB — CBC
Hematocrit: 38.6 % (ref 34.0–46.6)
Hemoglobin: 13.1 g/dL (ref 11.1–15.9)
MCH: 24.9 pg — ABNORMAL LOW (ref 26.6–33.0)
MCHC: 33.9 g/dL (ref 31.5–35.7)
MCV: 73 fL — ABNORMAL LOW (ref 79–97)
Platelets: 513 10*3/uL — ABNORMAL HIGH (ref 150–450)
RBC: 5.26 x10E6/uL (ref 3.77–5.28)
RDW: 15.9 % — ABNORMAL HIGH (ref 11.7–15.4)
WBC: 8.7 10*3/uL (ref 3.4–10.8)

## 2020-03-09 LAB — COMPREHENSIVE METABOLIC PANEL
ALT: 15 IU/L (ref 0–32)
AST: 21 IU/L (ref 0–40)
Albumin/Globulin Ratio: 1.3 (ref 1.2–2.2)
Albumin: 4.3 g/dL (ref 3.9–5.0)
Alkaline Phosphatase: 89 IU/L (ref 48–121)
BUN/Creatinine Ratio: 11 (ref 9–23)
BUN: 9 mg/dL (ref 6–20)
Bilirubin Total: 0.2 mg/dL (ref 0.0–1.2)
CO2: 24 mmol/L (ref 20–29)
Calcium: 9.9 mg/dL (ref 8.7–10.2)
Chloride: 101 mmol/L (ref 96–106)
Creatinine, Ser: 0.79 mg/dL (ref 0.57–1.00)
GFR calc Af Amer: 119 mL/min/{1.73_m2} (ref >59)
GFR calc non Af Amer: 103 mL/min/{1.73_m2} (ref >59)
Globulin, Total: 3.4 g/dL (ref 1.5–4.5)
Glucose: 87 mg/dL (ref 65–99)
Potassium: 5 mmol/L (ref 3.5–5.2)
Sodium: 139 mmol/L (ref 134–144)
Total Protein: 7.7 g/dL (ref 6.0–8.5)

## 2020-03-12 NOTE — Progress Notes (Signed)
Patient was assessed and managed by nursing staff during this encounter. I have reviewed the chart and agree with the documentation and plan. I have also made any necessary editorial changes.  Seymour Bing, MD 03/12/2020 2:11 PM

## 2020-03-15 ENCOUNTER — Ambulatory Visit: Payer: Medicaid Other

## 2020-03-19 ENCOUNTER — Ambulatory Visit: Payer: Medicaid Other

## 2020-03-20 NOTE — BH Specialist Note (Signed)
Integrated Behavioral Health via Telemedicine Video (Caregility) Visit  03/20/2020 Janice Duarte [id number]  Number of [place] visits: 2 Session Start time: 3:45  Session End time: 4:02 Total time: 17  Referring Provider: Juanna Cao, MD Type of Visit: Video Patient/Family location: Home [facility] Provider location: [facility] [place] [facility]  All persons participating in visit: Patient Janice Duarte and [name]    Confirmed patient's address: Yes  Confirmed patient's phone number: Yes  Any changes to demographics: No   Confirmed patient's insurance: Yes  Any changes to patient's insurance: No   Discussed confidentiality: at previous visit  I connected with Janice Duarte  by a video enabled telemedicine application ([employer]oo) and verified that I am speaking with the correct person using two identifiers.     I discussed the limitations of evaluation and management by telemedicine and the availability of in person appointments.  I discussed that the purpose of this visit is to provide behavioral health care while limiting exposure to the novel coronavirus.   Discussed there is a possibility of technology failure and discussed alternative modes of communication if that failure occurs.  I discussed that engaging in this virtual visit, they consent to the provision of behavioral healthcare and the services will be billed under their insurance.  Patient and/or legal guardian expressed understanding and consented to virtual visit: Yes   PRESENTING CONCERNS: Patient and/or family reports the following symptoms/concerns: Pt states her primary goals are to get back to work, and to manage her escalating symptoms of depression via ongoing therapy. Pt has no other concerns at this time.  Duration of problem: Postpartum; Severity of problem: moderate  STRENGTHS (Protective Factors/Coping Skills): Good social  support  GOALS ADDRESSED: Patient will: 1.  Reduce symptoms of: anxiety, depression and stress  2.  Demonstrate ability to: Increase healthy adjustment to current life circumstances and Increase motivation to adhere to plan of care  INTERVENTIONS: Interventions utilized:  Supportive Counseling and Psychoeducation and/or Health Education Standardized Assessments completed: GAD-7 and PHQ 9  ASSESSMENT: Patient currently experiencing Adjustment disorder with mixed anxiety and depressed mood .   Patient may benefit from continued psychoeducation and brief therapeutic interventions regarding coping with symptoms of depression and anxiety .  PLAN: 1. Follow up with behavioral health clinician on : As needed 2. Behavioral recommendations:  -Continue taking prenatal vitamin until postpartum visit -Continue with plan to go back to work after postpartum visit  -Consider establishing with ongoing therapist of choice (some options available on After Visit Summary) 3. Referral(s): [place] (In Clinic)  I discussed the assessment and treatment plan with the patient and/or parent/guardian. They were provided an opportunity to ask questions and all were answered. They agreed with the plan and demonstrated an understanding of the instructions.   They were advised to call back or seek an in-person evaluation if the symptoms worsen or if the condition fails to improve as anticipated.  Caroleen Hamman [facility]  Depression screen [facility] 2/9 03/26/2020 03/07/2020 02/29/2020 02/15/2020 02/08/2020  Decreased Interest 2 2 2 2  0  Down, Depressed, Hopeless 3 3 3 1 2   PHQ - 2 Score 5 5 5 3 2   Altered sleeping 3 1 0 1 3  Tired, decreased energy 3 2 3 2 3   Change in appetite 1 0 1 0 0  Feeling bad or failure about yourself  1 2 3  0 0  Trouble concentrating 0 0 0 0 0  Moving slowly or fidgety/restless 1 0 2 0 0  Suicidal thoughts 0 0 0 0 0  PHQ-9 Score 14 10 14 6 8   Some recent data might be  hidden   GAD 7 : Generalized Anxiety Score 03/26/2020 03/07/2020 02/29/2020 02/15/2020  Nervous, Anxious, on Edge 1 2 0 2  Control/stop worrying 1 3 3 2   Worry too much - different things 3 3 3 1   Trouble relaxing 3 2 3 3   Restless 1 2 1 1   Easily annoyed or irritable 3 2 3 3   Afraid - awful might happen 0 0 0 0  Total GAD 7 Score 12 14 13  12

## 2020-03-22 ENCOUNTER — Other Ambulatory Visit: Payer: Self-pay

## 2020-03-22 ENCOUNTER — Encounter: Payer: Medicaid Other | Admitting: Obstetrics & Gynecology

## 2020-03-22 NOTE — Progress Notes (Signed)
@  1032am connected with patient for postpartum appointment she stated that she has never been able to get her camera and mychart to connect correctly and would like to just come into the office to be seen. advised that I can get her rescheduled she stated that she prefers a afternoon appointment.

## 2020-03-26 ENCOUNTER — Encounter: Payer: Self-pay | Admitting: Obstetrics and Gynecology

## 2020-03-26 ENCOUNTER — Ambulatory Visit (INDEPENDENT_AMBULATORY_CARE_PROVIDER_SITE_OTHER): Payer: Medicaid Other | Admitting: Clinical

## 2020-03-26 ENCOUNTER — Other Ambulatory Visit: Payer: Self-pay | Admitting: Obstetrics and Gynecology

## 2020-03-26 DIAGNOSIS — O99345 Other mental disorders complicating the puerperium: Secondary | ICD-10-CM

## 2020-03-26 DIAGNOSIS — F4323 Adjustment disorder with mixed anxiety and depressed mood: Secondary | ICD-10-CM

## 2020-03-26 DIAGNOSIS — D75839 Thrombocytosis, unspecified: Secondary | ICD-10-CM

## 2020-03-26 NOTE — Patient Instructions (Signed)
Behavioral Health Resources:   What if I or someone I know is in crisis?  . If you are thinking about harming yourself or having thoughts of suicide, or if you know someone who is, seek help right away.  . Call your doctor or mental health care provider.  . Call 911 or go to a hospital emergency room to get immediate help, or ask a friend or family member to help you do these things.  . Call the [place] National Suicide Prevention Lifeline's toll-free, 24-hour hotline at 1-800-273-TALK (1-800-273-8255) or TTY: 1-800-799-4 TTY (1-800-799-4889) to talk to a trained counselor.  . If you are in crisis, make sure you are not left alone.   . If someone else is in crisis, make sure he or she is not left alone   24 Hour :   [place] National Suicide Hotline: 1-800-273-8255  Therapeutic Alternative Mobile Crisis: 1-877-626-1772   [facility]  [address], [place], [place] [address]  800-711-2635 or 336-832-9700  [facility] Crisis Line (Domestic Violence, Rape & Victim Assistance)  336-273-7273  [facility]  [address] [place], [place]  [address]   1-855-788-8787 or 336-676-6840   [facility] [place] [facility]: 336-899-1505 (8am-4pm) or 1-866- 261-5769 (after hours)           [facility], [address], [place], [place]  1-800-711-2635 Fax: 336-832-9701 [email or web address]  *Interpreters available *Accepts Medicaid, Medicare, uninsured  [facility]   Mon-Fri: 8am-5pm [address], [place], [place] 336-272-0855(phone); 336-272-9885(fax) www.[email or web address]  *Accepts Medicare  [facility] Mon, Tues, Thurs, Fri: 8am-4pm [address], [place], [place]  336-334-5000 (phone); 336-256-0121 (fax) www.[email or web address]  *Accepts Medicare  [facility] Mon-Fri: 9am-5pm  [address], [place], [place] 336-540-9400 (phone); 336-540-9454  www.[email or web address]  *Accepts Medicaid  [facility] [address], [place], [place]  336-271-5888  [email or web address]://[email or web address]   [facility] Mon-Fri, 8:30am-12pm/1pm-2:30pm [address], [place], [place] 336-387-6161 (phone); 336-387-9167 (fax) www.fspcares.org  *Accepts Medicaid, sliding-scale*Bilingual services available  [facility] Mon-Fri, 8am-7pm [address], [place], [place]  336-899-8800(phone); 336-899-8811(fax) www.[email or web address]  *Accepts Medicaid *Bilingual services available  [facility] Mon-Fri: 8am-5pm, Saturday by appointment only [address], [place], [place] [phone] (phone); 336-292-6711 (fax) www.[email or web address]   [facility] [address], [place], [place] 336-429-5600 www.[email or web address]  *Free & reduced services for uninsured and underinsured individuals *Bilingual services for Spanish-speaking clients 21 and under  [facility], [address], [place], [place] 336-676-6409(phone); 336-676-6409(fax) www.[facility].org  *Bring your own interpreter at first visit *Accepts Medicare and Medicaid  [facility] Mon-Fri: 9am-5:30pm [address], [place], [place] [phone] (phone), 336-419-4488 (fax) After hours crisis line: 336-763-1165 www.[email or web address]  *Accepts Medicare and Medicaid  [facility] Mon-Thurs, 8am-6pm [address], [place],   [phone] (phone); 336-288-0738 (fax) http://[email or web address]  *Subsidized costs available  [facility] Mon-Fri: 8am-4pm [address], ,   336-834-9664(phone); 336-834-9698(fax) www.[email or web address]  *Accepts Medicaid  [facility] [place] Same day access hours: Mon-Fri, 8:30-3pm Crisis hours: Mon-Fri, 8am-5pm [address], [place],   [facility] Same day access hours: Mon-Fri, 8:30-3pm Crisis hours: Mon-Fri, 8am-8pm [address], [place],  [phone] (phone); 336-899-1513 (fax) www.[facility].org  *Accepts Medicaid and Medicare  [facility] Mon, Wed, Fri: 9am-9pm Tues, Thurs: 9am-6pm [address],   [place], [place]  [phone] (phone); [phone] (fax) https://[email or web address]  *(Accepts Medicare and Medicaid; payment plans available)*Bilingual services available  [facility] [address], [place], [place] 336-272-1182 (phone); 336-272-1182 (fax) www.[email or web address]   [facility] [address], [place], [place]  [phone]  www.[email or web address]  *Bilingual services available  [facility] Mon-Thurs: 8am-8pm [address], [place], [place] [phone] (phone); 336-285-7174 (fax) [email or web address]://[email or web address]  *Accepts Medicaid*Bilingual services available  [facility] [address], [place], [place] [phone] (phone) [email or web address]://[email or web address]  *Accepts Medicaid *Bilingual services available  [facility] Mon-Fri, 9am-4:45pm [address], [place], [place] [phone] (phone); 336-450-4318 (fax) http://[email or web address]-[email or web address]  *Accepts Medicare  [facility] Mon-Thurs: 8:30-8pm, Fri: 8:30am-7pm [address], [place], [place] (3rd floor) [phone] (phone); 336-334-5754 (fax) [email or web address]://[email or web address]  *Accepts Medicaid; income-based reduced rates available  [facility] Mon-Fri: 8am-5pm [address], [place],  [place] [phone] (phone); [phone] (fax) http://www.[email or web address]  *Accepts Medicaid*Bilingual services available  [facility] [address], [place], [place]  [phone] (phone); [phone] (fax) www.[facility].org  *Free emergency housing and clinical services for youth in crisis  [facility] [place])  [address], [place] 336-373-1402 www.mhag.org  *Provides direct services to individuals in recovery from mental illness, including support groups, recovery skills classes, and one on one peer support  NAMI (National Alliance on Mental Illness) [place] NAMI helpline: 336-370-4264  https://namiguil[facility].org  *A community hub for information relating to local resources and services for the friends and families of individuals living alongside a mental health condition, as well as the individuals themselves. Classes and support groups also provided     

## 2020-04-08 NOTE — Progress Notes (Signed)
This encounter was created in error - please disregard.

## 2020-04-11 ENCOUNTER — Ambulatory Visit (INDEPENDENT_AMBULATORY_CARE_PROVIDER_SITE_OTHER): Payer: Medicaid Other | Admitting: Nurse Practitioner

## 2020-04-11 ENCOUNTER — Encounter: Payer: Self-pay | Admitting: Nurse Practitioner

## 2020-04-11 ENCOUNTER — Other Ambulatory Visit: Payer: Self-pay

## 2020-04-11 DIAGNOSIS — Z3040 Encounter for surveillance of contraceptives, unspecified: Secondary | ICD-10-CM

## 2020-04-11 DIAGNOSIS — K5901 Slow transit constipation: Secondary | ICD-10-CM

## 2020-04-11 DIAGNOSIS — N939 Abnormal uterine and vaginal bleeding, unspecified: Secondary | ICD-10-CM

## 2020-04-11 DIAGNOSIS — Z1389 Encounter for screening for other disorder: Secondary | ICD-10-CM | POA: Diagnosis not present

## 2020-04-11 DIAGNOSIS — E049 Nontoxic goiter, unspecified: Secondary | ICD-10-CM

## 2020-04-11 DIAGNOSIS — K649 Unspecified hemorrhoids: Secondary | ICD-10-CM

## 2020-04-11 MED ORDER — PREPARATION H 1-0.25-14.4-15 % EX CREA: TOPICAL_CREAM | CUTANEOUS | 1 refills | Status: DC

## 2020-04-11 MED ORDER — MEGESTROL ACETATE 40 MG PO TABS
ORAL_TABLET | ORAL | 1 refills | Status: DC
Start: 2020-04-11 — End: 2021-02-07

## 2020-04-11 NOTE — Progress Notes (Addendum)
Post Partum Visit Note  Janice Duarte is a 29 y.o. G72P1001 female who presents for a postpartum visit. She is 7 weeks 1 day postpartum following a normal spontaneous vaginal delivery.  I have fully reviewed the prenatal and intrapartum course. The delivery was at 38w 5d - induction for chronic hypertension in pregnancy. Anesthesia: epidural. Postpartum course has been good except for constant vaginal bleeding. Baby is doing well. Baby is feeding by bottle - Gerber Gentle. She breastfed for awhile but the baby would not latch even after working with the Advertising copywriter.  She was pumping and feeding breastmilk but her supply diminished and she switched to formula.  Bleeding red; changing pad every day, reports pad is completely saturated. Bowel function is abnormal: pt reports hermorrhoids are causing pain. Bladder function is normal. Patient is not sexually active. Contraception method is Nexplanon. Postpartum depression screening: positive.  Has seen [facility] provider in the office since delivery and will schedule again for another visit.  She has not had her labetalol for a couple of days - ran out. She only has a BM a couple of times a week and has hemorrhoids.  Using Tucks pads but hemorrhoids have not resolved.  The following portions of the patient's history were reviewed and updated as appropriate: allergies, current medications, past family history, past medical history, past social history, past surgical history and problem list.  Review of Systems Pertinent items noted in HPI and remainder of comprehensive ROS otherwise negative.    Objective:  Last menstrual period 05/26/2019, unknown if currently breastfeeding.  General:  alert, cooperative and no distress   Breasts:  not examined  Lungs: clear to auscultation bilaterally  Heart:  regular rate and rhythm, S1, S2 normal, no murmur, click, rub or gallop  Abdomen: soft, nontender, no masses   Vulva:  normal  Vagina: very small  amount of mucousy blood - one swab would clear the blood  Cervix:  closed and no active bleeding  Corpus: uterus is not enlarged  nontender  Adnexa:  no mass, fullness, tenderness  Rectal Exam: one external hemorrhoid, not edematous        Assessment:    Normal postpartum exam. Pap smear not done at today's visit.  Vaginal bleeding most likely due to nexplanon.  Plan:   Essential components of care per ACOG recommendations:  1.  Mood and well being: Patient with positive depression screening today. Reviewed local resources for support.  - Patient does not use tobacco. - hx of drug use? No   2. Infant care and feeding:  -Patient currently breastmilk feeding? No  -Social determinants of health (SDOH) reviewed in EPIC. No concerns  3. Sexuality, contraception and birth spacing - Patient does not know want a pregnancy in the next year.   - Reviewed forms of contraception in tiered fashion. Patient had Nexplanon inserted inpatient. - Discussed birth spacing of 18 months  4. Sleep and fatigue -Encouraged family/partner/community support of 4 hrs of uninterrupted sleep to help with mood and fatigue  5. Physical Recovery  - Discussed patients delivery vaginal and complications - Patient had no lacerations, perineal healing reviewed. Patient expressed understanding - Patient has urinary incontinence? No  - Patient is safe to resume physical and sexual activity  6.  Health Maintenance - Last pap smear done 2020 and was normal with negative HPV.  Discussed having BM daily or every other day and using Preparation H prior to BM to assist with defecating. Thyroid seemed large  on physical exam - will check labs. Since she has not had BP meds in a couple of days and has a normal BP today, will bring back in 2 weeks for BP check again to see if meds need to be restarted. Will schedule appointment with Asher Muir for positive depression screen. Has Nexplanon and likely her extended bleeding is  from the initiation of Nexplanon postpartum.  Will give Megace to help the bleeding stop and discussed how to take the Megace.  The bleeding today on exam is not as much as client describes that she is having so hopefully the Megace with help the bleeding stop.  Will check CBC. Thyroid seemed enlarged, but nontender, on physical exam and client has a history of family problems with thyroid so will check thyroid panel today.   Marjo Bicker, RN [facility] [employer], [facility]  Nolene Bernheim, RN, MSN, NP-BC Nurse Practitioner, [facility] for [employer], [facility] 04/11/2020 2:40 PM

## 2020-04-11 NOTE — Patient Instructions (Signed)

## 2020-04-12 LAB — CBC
Hematocrit: 35.1 % (ref 34.0–46.6)
Hemoglobin: 11.5 g/dL (ref 11.1–15.9)
MCH: 24.5 pg — ABNORMAL LOW (ref 26.6–33.0)
MCHC: 32.8 g/dL (ref 31.5–35.7)
MCV: 75 fL — ABNORMAL LOW (ref 79–97)
Platelets: 437 10*3/uL (ref 150–450)
RBC: 4.7 x10E6/uL (ref 3.77–5.28)
RDW: 15.3 % (ref 11.7–15.4)
WBC: 10.5 10*3/uL (ref 3.4–10.8)

## 2020-04-12 LAB — THYROID PANEL WITH TSH
Free Thyroxine Index: 1.5 (ref 1.2–4.9)
T3 Uptake Ratio: 26 % (ref 24–39)
T4, Total: 5.9 ug/dL (ref 4.5–12.0)
TSH: 0.561 u[IU]/mL (ref 0.450–4.500)

## 2020-04-16 NOTE — BH Specialist Note (Signed)
Pt did not arrive to video visit and did not answer the phone ; Left HIPPA-compliant message to call back Asher Muir from [facility] [employer] at [facility] at [phone] (main office) or [phone] (Kenyata Napier's office).     Integrated Behavioral Health via Telemedicine Video (Caregility) Visit  04/16/2020 KADINCE BOXLEY [phone]  Rae Lips

## 2020-04-23 ENCOUNTER — Ambulatory Visit: Payer: Medicaid Other | Admitting: Clinical

## 2020-04-23 ENCOUNTER — Other Ambulatory Visit: Payer: Self-pay

## 2020-04-23 DIAGNOSIS — Z91199 Patient's noncompliance with other medical treatment and regimen due to unspecified reason: Secondary | ICD-10-CM

## 2020-04-25 ENCOUNTER — Ambulatory Visit: Payer: Medicaid Other

## 2020-04-26 ENCOUNTER — Other Ambulatory Visit: Payer: Self-pay

## 2020-04-26 ENCOUNTER — Ambulatory Visit (INDEPENDENT_AMBULATORY_CARE_PROVIDER_SITE_OTHER): Payer: Medicaid Other

## 2020-04-26 VITALS — BP 109/74 | HR 81 | Wt 172.1 lb

## 2020-04-26 DIAGNOSIS — Z0130 Encounter for examination of blood pressure without abnormal findings: Secondary | ICD-10-CM

## 2020-04-26 NOTE — Progress Notes (Signed)
Patient here for blood pressure check following postpartum visit on 04/11/20; labetalol discontinued at that visit. Per chart review pt has recent hx of chronic hypertension during pregnancy. BP today is 109/74, HR 81. Patient denies any symptoms of hypertension. Reviewed with Debroah Loop, MD who states pt should continue current plan of care and does not need any follow up at this time. Encouraged pt to follow up with our office as needed.   Fleet Contras RN 04/26/20

## 2020-04-30 NOTE — Progress Notes (Signed)
Patient ID: Janice Duarte, female   DOB: 1991/02/09, 29 y.o.   MRN: [id number] Patient was assessed and managed by nursing staff during this encounter. I have reviewed the chart and agree with the documentation and plan. I have also made any necessary editorial changes.  Scheryl Darter, MD 04/30/2020 3:19 PM

## 2020-05-24 ENCOUNTER — Telehealth (INDEPENDENT_AMBULATORY_CARE_PROVIDER_SITE_OTHER): Payer: Medicaid Other | Admitting: Family Medicine

## 2020-05-24 DIAGNOSIS — N939 Abnormal uterine and vaginal bleeding, unspecified: Secondary | ICD-10-CM

## 2020-05-24 NOTE — Telephone Encounter (Signed)
Returned patient's call. Patient reports she had a period 2 weeks ago (Aug 9-16). She reports she is bleeding again. She is having cramping and heavy like her normal cycle.   Patient reports she took the Megace after her last visit. She has a refill at the Pharmacy as she reports she did not get there refill.   Discussed that some abnormal bleeding can effect bleeding for 3-6 months until body adjusts.   Informed patient to call the office if after taking the Megace she does not have relief from the bleeding. Patient voiced understanding.

## 2020-05-24 NOTE — Telephone Encounter (Signed)
Patient state she has been bleeding and want to speak to someone

## 2020-05-25 ENCOUNTER — Other Ambulatory Visit: Payer: Self-pay | Admitting: Nurse Practitioner

## 2020-08-04 IMAGING — US US MFM OB FOLLOW-UP
2 series · 13 of 28 positions shown · non-contrast
Comparison: none

[Series 1: us mfm ob follow-up · 68 acquisitions, 12 frames shown (1 of 2)]
[im 3/68]
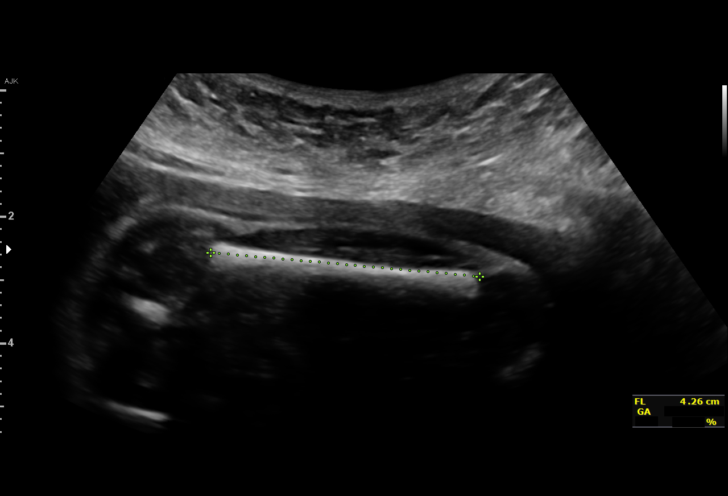
[im 9/68]
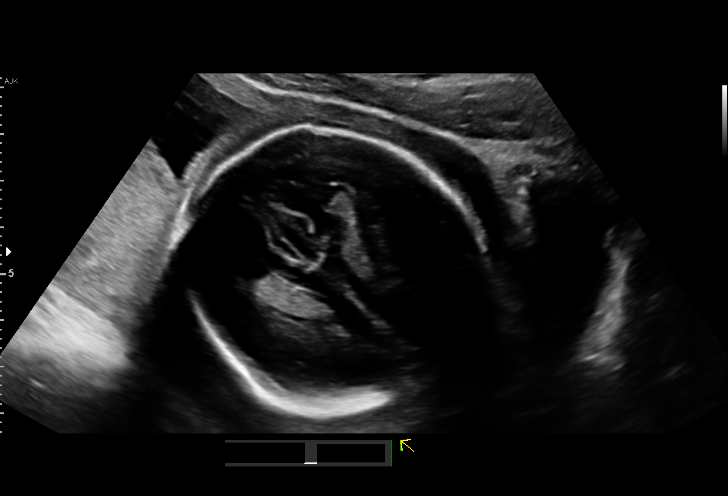
[im 14/68]
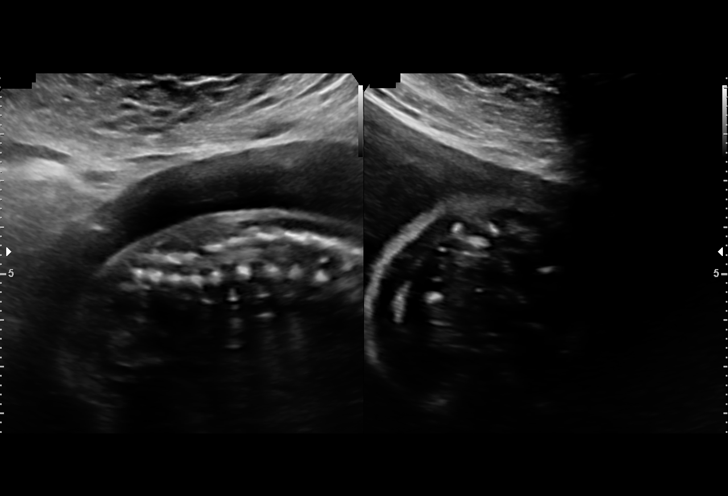
[im 20/68]
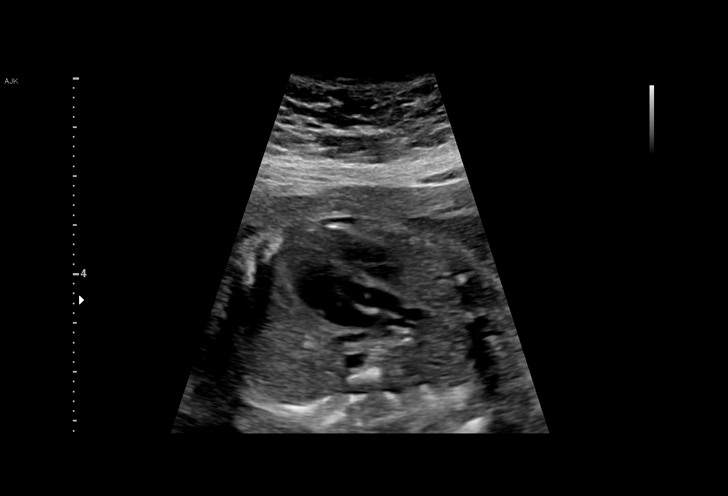
[im 26/68]
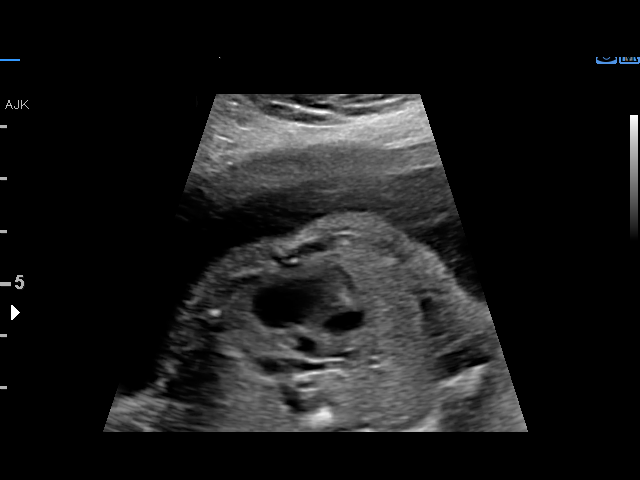
[im 31/68]
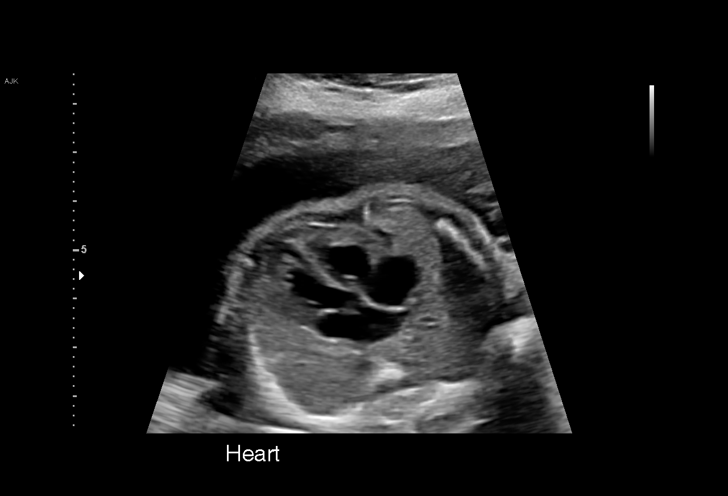
[im 40/68]
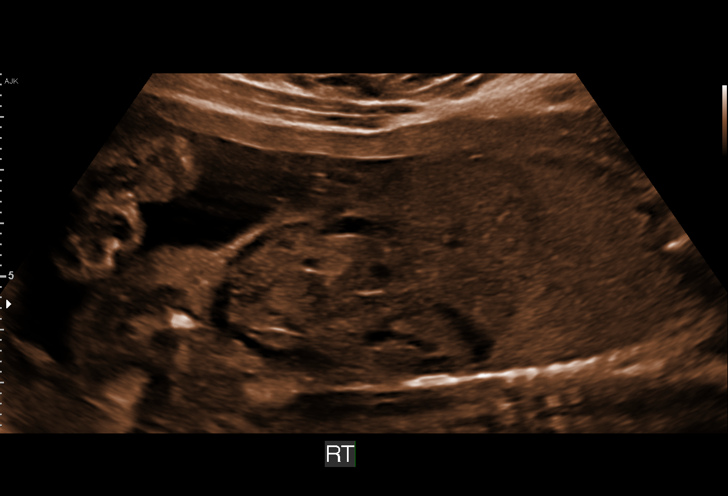
[im 45/68]
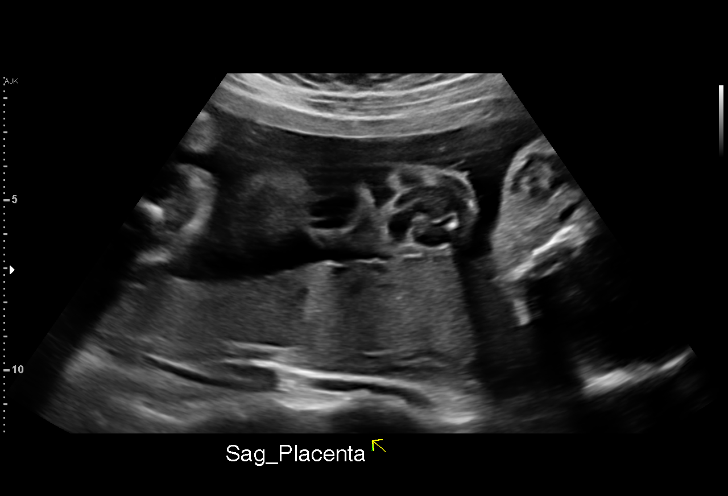
[im 51/68]
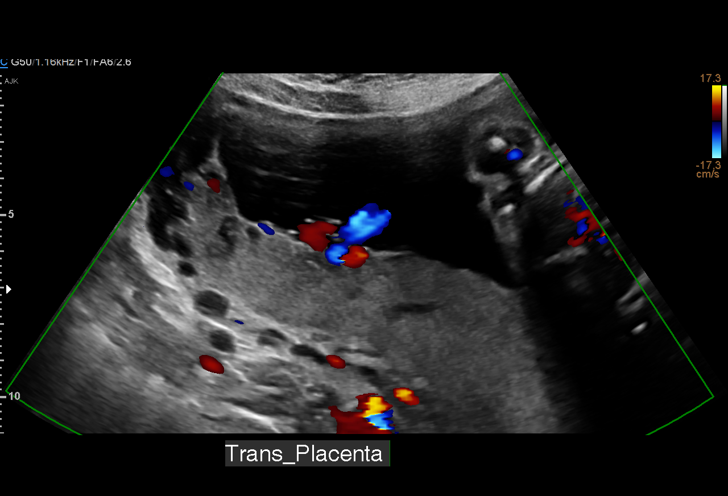
[im 56/68]
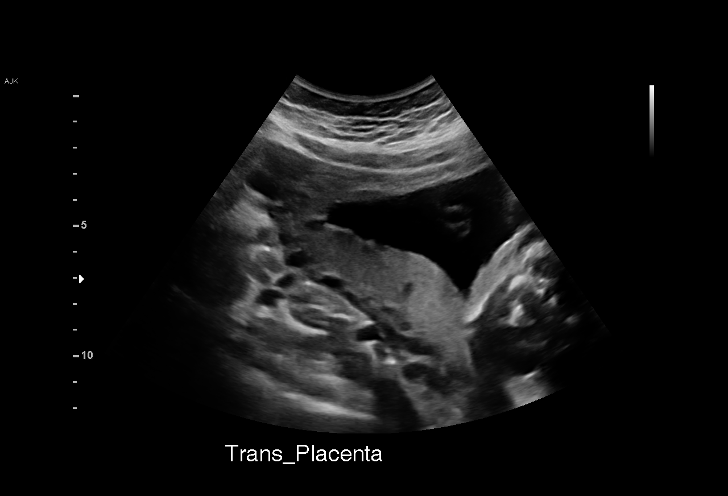
[im 62/68]
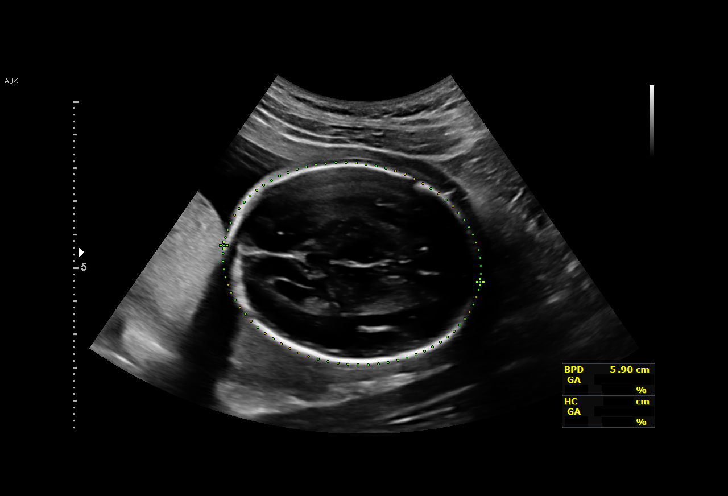
[im 68/68]
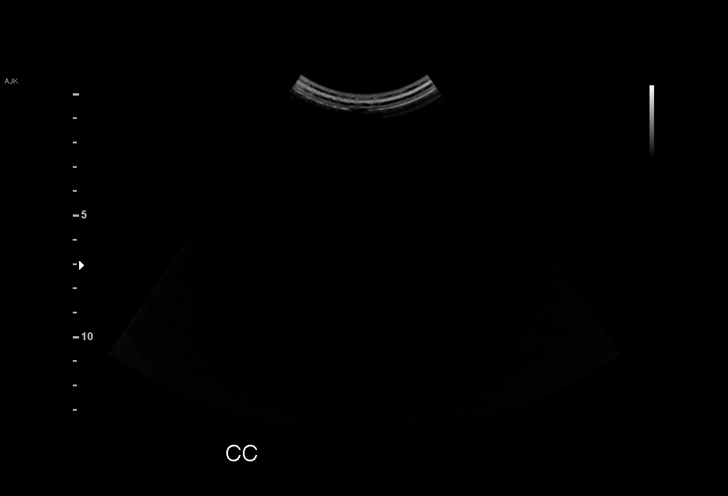

[Series 3: us mfm ob follow-up · 1 of 9 slices shown (2 of 2)]
[im 5/9]
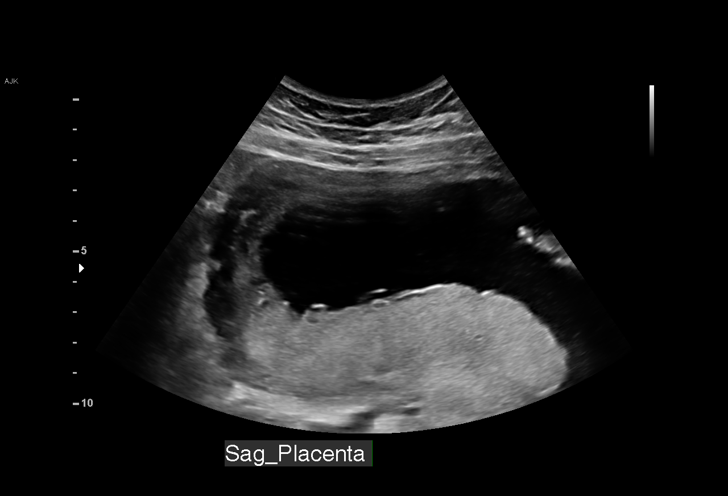

[13 of 28 positions shown; findings below may reference images not displayed]

----------------------------------------------------------------------

 ----------------------------------------------------------------------
Indications

  24 weeks gestation of pregnancy
  Encounter for antenatal screening for
  malformations (Low Risk NIPS)
  Genetic carrier (Silent Nahuee Bercovich)
  Genetic carrier (Sickle Cell Disease)
  Hypertension - Chronic/Pre-existing (ASA)
 ----------------------------------------------------------------------
Fetal Evaluation

 Num Of Fetuses:         1
 Fetal Heart Rate(bpm):  124
 Cardiac Activity:       Observed
 Presentation:           Cephalic
 Placenta:               Posterior
 P. Cord Insertion:      Visualized

 Amniotic Fluid
 AFI FV:      Within normal limits

                             Largest Pocket(cm)

Biometry

 BPD:      58.6  mm     G. Age:  24w 0d         43  %    CI:        71.93   %    70 - 86
                                                         FL/HC:      19.5   %    18.7 -
 HC:      219.9  mm     G. Age:  24w 0d         33  %    HC/AC:      1.14        1.05 -
 AC:      192.9  mm     G. Age:  24w 0d         41  %    FL/BPD:     73.0   %    71 - 87
 FL:       42.8  mm     G. Age:  24w 0d         37  %    FL/AC:      22.2   %    20 - 24
 LV:        4.9  mm

 Est. FW:     650  gm      1 lb 7 oz     41  %
OB History

 Gravidity:    1         Term:   0
 Living:       0
Gestational Age

 LMP:           24w 0d        Date:  05/26/19                 EDD:   03/01/20
 U/S Today:     24w 0d                                        EDD:   03/01/20
 Best:          24w 0d     Det. By:  LMP  (05/26/19)          EDD:   03/01/20
Anatomy

 Cranium:               Appears normal         Aortic Arch:            Appears normal
 Cavum:                 Appears normal         Ductal Arch:            Appears normal
 Ventricles:            Appears normal         Diaphragm:              Appears normal
 Choroid Plexus:        Appears normal         Stomach:                Appears normal, left
                                                                       sided
 Cerebellum:            Previously seen        Abdomen:                Appears normal
 Posterior Fossa:       Previously seen        Abdominal Wall:         Previously seen
 Nuchal Fold:           Previously seen        Cord Vessels:           Appears normal (3
                                                                       vessel cord)
 Face:                  Orbits and profile     Kidneys:                Appear normal
                        previously seen
 Lips:                  Previously seen        Bladder:                Appears normal
 Thoracic:              Appears normal         Spine:                  Appears normal
 Heart:                 Appears normal         Upper Extremities:      Previously seen
                        (4CH, axis, and
                        situs)
 RVOT:                  Appears normal         Lower Extremities:      Previously seen
 LVOT:                  Appears normal

 Other:  Male gender. Nasal bone previously visualized.
Cervix Uterus Adnexa

 Cervix
 Length:            3.5  cm.
 Normal appearance by transabdominal scan.

 Uterus
 No abnormality visualized.

 Left Ovary
 Within normal limits.

 Right Ovary
 Within normal limits.

 Adnexa
 No abnormality visualized.
Impression

 Patient with chronic hypertension return for fetal growth
 assessment.  She does not take antihypertensives and her
 blood pressures are well controlled.

 BP today at our office is 1 1 8/78 mmHg.

 Fetal growth is appropriate for gestational age.  Amniotic fluid
 is normal good fetal activity seen.
 We reassured the patient of the findings.
Recommendations

 -An appointment was made for her to return in 4 weeks for
 fetal growth assessment.
                 [place], [place]

## 2020-09-04 ENCOUNTER — Telehealth: Payer: Self-pay | Admitting: Family Medicine

## 2020-09-04 NOTE — Telephone Encounter (Signed)
Pt is calling because she is concerned about frequent cycle, had cycle 2 weeks ago and lasted 2 weeks, now pt is having cycle again. Pt is concerned and would like to speak with someone or should she make appt to come in. Please call pt back. Thank you

## 2020-09-05 NOTE — Telephone Encounter (Signed)
I called Janice Duarte back and we discussed she has had nexplanon since 01/2020- placed postpartum in hospital. She states she has been having bleeding 2 periods a month since then and is tired of bleeding and wants nexplanon out. States she bleeds for about 2 weeks, stops bleeding 3-7 days then bleeds again. States she gets headaches sometimes and wants nexplanon out and to get IUD.  We discussed since she had tried it for 7 months it is reasonable to get nexplanon removed and try IUD. I informed her I will have front office call her for appointment since she has specific preferences for day/ time . She voices understanding. Porschea Borys,RN

## 2020-09-13 ENCOUNTER — Other Ambulatory Visit: Payer: Self-pay

## 2020-09-13 ENCOUNTER — Emergency Department (HOSPITAL_COMMUNITY)
Admission: EM | Admit: 2020-09-13 | Discharge: 2020-09-13 | Disposition: A | Discharge status: Home / Self Care | Payer: Medicaid Other | Attending: Emergency Medicine | Admitting: Emergency Medicine

## 2020-09-13 ENCOUNTER — Emergency Department (HOSPITAL_BASED_OUTPATIENT_CLINIC_OR_DEPARTMENT_OTHER): Payer: Medicaid Other

## 2020-09-13 ENCOUNTER — Emergency Department (HOSPITAL_BASED_OUTPATIENT_CLINIC_OR_DEPARTMENT_OTHER)
Admission: EM | Admit: 2020-09-13 | Discharge: 2020-09-13 | Disposition: A | Discharge status: Home / Self Care | Payer: Medicaid Other | Source: Home / Self Care | Attending: Emergency Medicine | Admitting: Emergency Medicine

## 2020-09-13 ENCOUNTER — Encounter (HOSPITAL_BASED_OUTPATIENT_CLINIC_OR_DEPARTMENT_OTHER): Payer: Self-pay | Admitting: Emergency Medicine

## 2020-09-13 DIAGNOSIS — J029 Acute pharyngitis, unspecified: Secondary | ICD-10-CM | POA: Insufficient documentation

## 2020-09-13 DIAGNOSIS — D72829 Elevated white blood cell count, unspecified: Secondary | ICD-10-CM | POA: Insufficient documentation

## 2020-09-13 DIAGNOSIS — Z20822 Contact with and (suspected) exposure to covid-19: Secondary | ICD-10-CM | POA: Diagnosis not present

## 2020-09-13 DIAGNOSIS — Z79899 Other long term (current) drug therapy: Secondary | ICD-10-CM | POA: Diagnosis not present

## 2020-09-13 DIAGNOSIS — I10 Essential (primary) hypertension: Secondary | ICD-10-CM | POA: Insufficient documentation

## 2020-09-13 LAB — BASIC METABOLIC PANEL
Anion gap: 11 (ref 5–15)
BUN: 8 mg/dL (ref 6–20)
CO2: 23 mmol/L (ref 22–32)
Calcium: 9.2 mg/dL (ref 8.9–10.3)
Chloride: 102 mmol/L (ref 98–111)
Creatinine, Ser: 0.78 mg/dL (ref 0.44–1.00)
GFR, Estimated: >60 mL/min (ref >60)
Glucose, Bld: 82 mg/dL (ref 70–99)
Potassium: 4.1 mmol/L (ref 3.5–5.1)
Sodium: 136 mmol/L (ref 135–145)

## 2020-09-13 LAB — CBC WITH DIFFERENTIAL/PLATELET
Abs Immature Granulocytes: 0.09 10*3/uL — ABNORMAL HIGH (ref 0.00–0.07)
Basophils Absolute: 0 10*3/uL (ref 0.0–0.1)
Basophils Relative: 0 %
Eosinophils Absolute: 0 10*3/uL (ref 0.0–0.5)
Eosinophils Relative: 0 %
HCT: 36.3 % (ref 36.0–46.0)
Hemoglobin: 12.3 g/dL (ref 12.0–15.0)
Immature Granulocytes: 1 %
Lymphocytes Relative: 15 %
Lymphs Abs: 2.9 10*3/uL (ref 0.7–4.0)
MCH: 24.5 pg — ABNORMAL LOW (ref 26.0–34.0)
MCHC: 33.9 g/dL (ref 30.0–36.0)
MCV: 72.3 fL — ABNORMAL LOW (ref 80.0–100.0)
Monocytes Absolute: 1 10*3/uL (ref 0.1–1.0)
Monocytes Relative: 5 %
Neutro Abs: 15.7 10*3/uL — ABNORMAL HIGH (ref 1.7–7.7)
Neutrophils Relative %: 79 %
Platelets: 425 10*3/uL — ABNORMAL HIGH (ref 150–400)
RBC: 5.02 MIL/uL (ref 3.87–5.11)
RDW: 14.6 % (ref 11.5–15.5)
WBC: 19.7 10*3/uL — ABNORMAL HIGH (ref 4.0–10.5)
nRBC: 0 % (ref 0.0–0.2)

## 2020-09-13 LAB — GROUP A STREP BY PCR: Group A Strep by PCR: NOT DETECTED

## 2020-09-13 LAB — RESP PANEL BY RT-PCR (FLU A&B, COVID) ARPGX2
Influenza A by PCR: NEGATIVE
Influenza B by PCR: NEGATIVE
SARS Coronavirus 2 by RT PCR: NEGATIVE

## 2020-09-13 MED ORDER — LIDOCAINE VISCOUS HCL 2 % MT SOLN
15.0000 mL | Freq: Once | OROMUCOSAL | Status: AC
  Administered 2020-09-13: 15 mL via OROMUCOSAL
  Filled 2020-09-13: qty 15

## 2020-09-13 MED ORDER — LIDOCAINE VISCOUS HCL 2 % MT SOLN: 15.0000 mL | OROMUCOSAL | 0 refills | Status: DC | PRN

## 2020-09-13 MED ORDER — KETOROLAC TROMETHAMINE 15 MG/ML IJ SOLN
15.0000 mg | Freq: Once | INTRAMUSCULAR | Status: AC
  Administered 2020-09-13: 15 mg via INTRAVENOUS
  Filled 2020-09-13: qty 1

## 2020-09-13 MED ORDER — AZITHROMYCIN 250 MG PO TABS: 500.0000 mg | ORAL_TABLET | Freq: Every day | ORAL | 0 refills | Status: AC

## 2020-09-13 MED ORDER — KETOROLAC TROMETHAMINE 10 MG PO TABS: 10.0000 mg | ORAL_TABLET | Freq: Three times a day (TID) | ORAL | 0 refills | Status: DC | PRN

## 2020-09-13 NOTE — ED Triage Notes (Signed)
Body aches, sore throat for 2 days, seen at [facility] last night, states strep and COVID/FLU all negative. Still c/o body aches.

## 2020-09-13 NOTE — ED Provider Notes (Signed)
[facility] DEPT Provider Note   CSN: [id number] Arrival date & time: 09/13/20  0155     History Chief Complaint  Patient presents with  . Sore Throat    Janice Duarte is a 29 y.o. female with a history of alpha thalassemia, sickle cell trait, hidradenitis suppurativa, scoliosis, and hypertension who presents the emergency department with a chief complaint of sore throat.  The patient reports sudden onset bilateral sore throats, onset 24 hours ago.  She reports associated dysphagia and nasal congestion.  She denies fever, chills, cough, shortness of breath, chest pain, loss of sense of taste or smell, abdominal pain, nausea, vomiting, diarrhea, wheezing, rash, drooling, or muffled voice.  No treatment for symptoms prior to arrival.  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  She denies any sick contacts at home.   The history is provided by the patient and medical records. No language interpreter was used.       Past Medical History:  Diagnosis Date  . Abscess   . Bacterial vaginal infection   . Headaches, cluster   . Hidradenitis suppurativa    Breast, axillae, groin  . Hypertension   . Scoliosis   . Scoliosis     Patient Active Problem List   Diagnosis Date Noted  . Thrombocytosis 03/26/2020  . Chronic hypertension in pregnancy 02/19/2020  . Alpha thalassemia silent carrier 02/08/2020  . Sickle cell trait ([place]) 02/08/2020  . Allergic rhinitis 12/28/2019  . Substance use 12/20/2019  . Hidradenitis suppurativa     Past Surgical History:  Procedure Laterality Date  . WISDOM TOOTH EXTRACTION       OB History    Gravida  1   Para  1   Term  1   Preterm  0   AB  0   Living  1     SAB  0   IAB  0   Ectopic  0   Multiple  0   Live Births  1           Family History   Problem Relation Age of Onset  . Diabetes Other   . Hypertension Other   . Hypertension Mother   . Heart failure Mother   . Hepatitis B Mother   . Cirrhosis Mother   . Hypertension Paternal Aunt   . Hypertension Paternal Grandmother   . Thyroid disease Paternal Grandmother     Social History   Tobacco Use  . Smoking status: Never Smoker  . Smokeless tobacco: Never Used  Vaping Use  . Vaping Use: Never used  Substance Use Topics  . Alcohol use: Not Currently    Comment: socially  . Drug use: Not Currently    Frequency: 7.0 times per week    Types: Marijuana    Comment: LAST SMOKED 12-29-19    Home Medications Prior to Admission medications   Medication Sig Start Date End Date Taking? Authorizing Provider  Blood Pressure Monitoring (BLOOD PRESSURE KIT) DEVI 1 Device by Does not apply route daily. ICD 10: Z34.00 09/19/19   Woodroe Mode, MD  lidocaine (XYLOCAINE) 2 % solution Use as directed 15 mLs in the mouth or throat every 3 (three) hours as needed for mouth pain. 09/13/20   Kameshia Madruga A, PA-C  megestrol (MEGACE) 40 MG tablet Take by mouth  up to 3 times a day until the bleeding stops and then taper down to twice a day and then once a day. 04/11/20   Virginia Rochester, NP  Pramox-PE-Glycerin-Petrolatum (PREPARATION H) 1-0.25-14.4-15 % CREA Use externally to affected area twice a day 04/11/20   Virginia Rochester, NP  Prenatal MV-Min-FA-Omega-3 (PRENATAL GUMMIES/DHA & FA PO) Take 2 tablets by mouth daily.    [provider]  labetalol (NORMODYNE) 200 MG tablet Take 1 tablet (200 mg total) by mouth 2 (two) times daily. Patient not taking: Reported on 04/26/2020 02/29/20 09/13/20  Cherre Blanc, MD    Allergies    Penicillins, Morphine and related, and Bactrim [sulfamethoxazole-trimethoprim]  Review of Systems   Review of Systems  Constitutional: Negative for activity change, chills and fever.  HENT: Positive for congestion, sore throat and trouble swallowing.  Negative for drooling, ear pain, facial swelling, nosebleeds, postnasal drip, sinus pressure, sinus pain and voice change.   Respiratory: Negative for shortness of breath and wheezing.   Cardiovascular: Negative for chest pain and palpitations.  Gastrointestinal: Negative for abdominal pain, anal bleeding, blood in stool, diarrhea, nausea and vomiting.  Genitourinary: Negative for dysuria.  Musculoskeletal: Negative for back pain, joint swelling, myalgias and neck stiffness.  Skin: Negative for rash and wound.  Allergic/Immunologic: Negative for immunocompromised state.  Neurological: Negative for dizziness, seizures, syncope, weakness, numbness and headaches.  Psychiatric/Behavioral: Negative for confusion.    Physical Exam Updated Vital Signs BP 123/82 (BP Location: Right Arm)   Pulse 71   Temp 99.4 F (37.4 C) (Oral)   Resp 17   Ht _0  (1.651 m)   Wt 78 kg   SpO2 99%   BMI 28.62 kg/m   Physical Exam Vitals and nursing note reviewed.  Constitutional:      General: She is not in acute distress.    Appearance: She is not ill-appearing, toxic-appearing or diaphoretic.  HENT:     Head: Normocephalic.     Right Ear: Tympanic membrane, ear canal and external ear normal.     Left Ear: Tympanic membrane, ear canal and external ear normal.     Nose: Congestion present. No rhinorrhea.     Mouth/Throat:     Mouth: Mucous membranes are moist.     Dentition: No dental abscesses.     Palate: No mass and lesions.     Pharynx: Posterior oropharyngeal erythema present. No oropharyngeal exudate or uvula swelling.     Tonsils: No tonsillar exudate or tonsillar abscesses. 2+ on the right. 2+ on the left.     Comments: No sublingual edema.  Phonation is normal.  Tolerating secretions without difficulty.  No trismus. Eyes:     Conjunctiva/sclera: Conjunctivae normal.  Cardiovascular:     Rate and Rhythm: Normal rate and regular rhythm.     Heart sounds: No murmur heard. No friction rub.  No gallop.   Pulmonary:     Effort: Pulmonary effort is normal. No respiratory distress.     Breath sounds: No stridor. No wheezing, rhonchi or rales.  Chest:     Chest wall: No tenderness.  Abdominal:     General: There is no distension.     Palpations: Abdomen is soft.  Musculoskeletal:     Cervical back: Neck supple.  Skin:    General: Skin is warm.     Findings: No rash.  Neurological:     Mental Status: She is alert.  Psychiatric:        Behavior: Behavior  normal.     ED Results / Procedures / Treatments   Labs (all labs ordered are listed, but only abnormal results are displayed) Labs Reviewed  GROUP A STREP BY PCR  RESP PANEL BY RT-PCR (FLU A&B, COVID) [id number]    EKG None  Radiology No results found.  Procedures Procedures (including critical care time)  Medications Ordered in ED Medications  lidocaine (XYLOCAINE) 2 % viscous mouth solution 15 mL (15 mLs Mouth/Throat Given 09/13/20 0404)    ED Course  I have reviewed the triage vital signs and the nursing notes.  Pertinent labs & imaging results that were available during my care of the patient were reviewed by me and considered in my medical decision making (see chart for details).    MDM Rules/Calculators/A&P                          29 year old female with a history of alpha thalassemia, sickle cell trait, hidradenitis suppurativa, scoliosis, and hypertension who presents the emergency department with 24 hours of sore throat, dysphagia, and nasal congestion.  She does work with several elderly patients have been ill recently.  No constitutional symptoms.  Vital signs are stable.  On exam, she has 2+ tonsillar edema bilaterally.  There is some erythema of the posterior oropharynx, but physical exam is otherwise unremarkable.  I have a low suspicion for peritonsillar abscess, retropharyngeal abscess, or Ludwick's angina.  Labs have been reviewed and independently interpreted by me.  Strep PCR is  negative.  COVID-19, RSV, and influenza AMB are all negative.  She was given viscous lidocaine in the ER with significant improvement in her symptoms.  Suspect viral pharyngitis.  Recommended home supportive care.  She is hemodynamically stable and in no acute distress.  ER return precautions given.  Safe for discharge to home with outpatient follow-up as needed.    Final Clinical Impression(s) / ED Diagnoses Final diagnoses:  Viral pharyngitis    Rx / DC Orders ED Discharge Orders         Ordered    lidocaine (XYLOCAINE) 2 % solution  Every  3 hours PRN        09/13/20 0503           Derek Laughter A, PA-C 09/13/20 0845    Ward, Delice Bison, DO 09/14/20 0111

## 2020-09-13 NOTE — Discharge Instructions (Addendum)
Thank you for allowing me to care for you today in the Emergency Department.   You are seen today for sore throat.  You tested negative for strep throat.  Your COVID-19 test is pending.  You should receive a call from the hospital if your COVID-19 test is positive.  If it is positive, you will need to quarantine at home for a total of 10 days from when your symptoms began.  If it is negative, you can return to work as long as you do not have a fever, which is a temperature greater than 100.4 F.  Eating and drinking cold beverages may be easier to swallow than room temperature food and drink.  You can swallow 15 mL of viscous lidocaine once every 3 hours as needed for sore throat.  Take 650 mg of Tylenol or 600 mg of ibuprofen with food every 6 hours for pain.  You can alternate between these 2 medications every 3 hours if your pain returns.  For instance, you can take Tylenol at noon, followed by a dose of ibuprofen at 3, followed by second dose of Tylenol and 6.  Return to the emergency department if you become unable to swallow, if you become unable to open your mouth, develop difficulty breathing, if you become unable to move your neck, or have other new, concerning symptoms.

## 2020-09-13 NOTE — ED Triage Notes (Signed)
Pt c/o sore throat x2-3 days getting worse last night, denies fevers but states she feels hot. Denies known sick exposure

## 2020-09-13 NOTE — Discharge Instructions (Signed)
Take antibiotic as prescribed.  Follow-up with primary doctor for recheck in the next few days, repeat blood work within the next couple weeks to monitor your white blood cell count.  If you develop fever, difficulty swallowing, neck stiffness, headaches, vomiting or other new concerning symptom, return to ER for reassessment.

## 2020-09-14 ENCOUNTER — Telehealth: Payer: Self-pay | Admitting: *Deleted

## 2020-09-14 NOTE — ED Provider Notes (Signed)
[facility] EMERGENCY DEPARTMENT Provider Note   CSN: [id number] Arrival date & time: 09/13/20  1815     History No chief complaint on file.   Janice Duarte is a 29 y.o. female. history of alpha thalassemia, sickle cell trait, hidradenitis suppurativa, scoliosis, and hypertension   Presents to ER with concern for sore throat.  Symptoms ongoing for the past day or so.  Has had some pain with swallowing, still able to tolerate p.o. without difficulty.  Has had some general fatigue and nasal congestion.  Has not noted any neck pain or neck swelling.  No fevers.  Not sexually active, no STD exposures, no oral sex.  HPI     Past Medical History:  Diagnosis Date  . Abscess   . Bacterial vaginal infection   . Headaches, cluster   . Hidradenitis suppurativa    Breast, axillae, groin  . Hypertension   . Scoliosis   . Scoliosis     Patient Active Problem List   Diagnosis Date Noted  . Thrombocytosis 03/26/2020  . Chronic hypertension in pregnancy 02/19/2020  . Alpha thalassemia silent carrier 02/08/2020  . Sickle cell trait ([place]) 02/08/2020  . Allergic rhinitis 12/28/2019  . Substance use 12/20/2019  . Hidradenitis suppurativa     Past Surgical History:  Procedure Laterality Date  . WISDOM TOOTH EXTRACTION       OB History    Gravida  1   Para  1   Term  1   Preterm  0   AB  0   Living  1     SAB  0   IAB  0   Ectopic  0   Multiple  0   Live Births  1           Family History  Problem Relation Age of Onset  . Diabetes Other   . Hypertension Other   . Hypertension Mother   . Heart failure Mother   . Hepatitis B Mother   . Cirrhosis Mother   . Hypertension Paternal Aunt   . Hypertension Paternal Grandmother   . Thyroid disease Paternal Grandmother     Social History   Tobacco Use  . Smoking status: Never Smoker  . Smokeless tobacco: Never Used  Vaping Use  . Vaping Use: Never used  Substance Use Topics  . Alcohol use:  Not Currently    Comment: socially  . Drug use: Not Currently    Frequency: 7.0 times per week    Types: Marijuana    Comment: LAST SMOKED 12-29-19    Home Medications Prior to Admission medications   Medication Sig Start Date End Date Taking? Authorizing Provider  azithromycin (ZITHROMAX) 250 MG tablet Take 2 tablets (500 mg total) by mouth daily for 5 days. Take first 2 tablets together, then 1 every day until finished. 09/13/20 09/18/20  Lucrezia Starch, MD  Blood Pressure Monitoring (BLOOD PRESSURE KIT) DEVI 1 Device by Does not apply route daily. ICD 10: Z34.00 09/19/19   Woodroe Mode, MD  ketorolac (TORADOL) 10 MG tablet Take 1 tablet (10 mg total) by mouth every 8 (eight) hours as needed for severe pain. 09/13/20   Lucrezia Starch, MD  lidocaine (XYLOCAINE) 2 % solution Use as directed 15 mLs in the mouth or throat every 3 (three) hours as needed for mouth pain. 09/13/20   McDonald, Mia A, PA-C  megestrol (MEGACE) 40 MG tablet Take by mouth up to 3 times a day until the  bleeding stops and then taper down to twice a day and then once a day. 04/11/20   Virginia Rochester, NP  Pramox-PE-Glycerin-Petrolatum (PREPARATION H) 1-0.25-14.4-15 % CREA Use externally to affected area twice a day 04/11/20   Virginia Rochester, NP  Prenatal MV-Min-FA-Omega-3 (PRENATAL GUMMIES/DHA & FA PO) Take 2 tablets by mouth daily.    [provider]  labetalol (NORMODYNE) 200 MG tablet Take 1 tablet (200 mg total) by mouth 2 (two) times daily. Patient not taking: Reported on 04/26/2020 02/29/20 09/13/20  Cherre Blanc, MD    Allergies    Penicillins, Morphine and related, and Bactrim [sulfamethoxazole-trimethoprim]  Review of Systems   Review of Systems  Constitutional: Negative for chills and fever.  HENT: Positive for sore throat. Negative for ear pain.   Eyes: Negative for pain and visual disturbance.  Respiratory: Negative for cough and shortness of breath.   Cardiovascular: Negative  for chest pain and palpitations.  Gastrointestinal: Negative for abdominal pain and vomiting.  Genitourinary: Negative for dysuria and hematuria.  Musculoskeletal: Negative for arthralgias and back pain.  Skin: Negative for color change and rash.  Neurological: Negative for seizures and syncope.  All other systems reviewed and are negative.   Physical Exam Updated Vital Signs BP 128/84   Pulse 70   Temp 98 F (36.7 C)   Resp 18   SpO2 99%   Physical Exam Vitals and nursing note reviewed.  Constitutional:      General: She is not in acute distress.    Appearance: She is well-developed and well-nourished.  HENT:     Head: Normocephalic and atraumatic.     Mouth/Throat:     Comments: There is tonsillar exudate, somewhat erythematous posterior oropharynx, no swelling or evidence for PTA Eyes:     Conjunctiva/sclera: Conjunctivae normal.  Neck:     Comments: Normal neck range of motion, no mass noted or swelling noted Cardiovascular:     Rate and Rhythm: Normal rate and regular rhythm.     Heart sounds: No murmur heard.   Pulmonary:     Effort: Pulmonary effort is normal. No respiratory distress.     Breath sounds: Normal breath sounds.  Abdominal:     Palpations: Abdomen is soft.     Tenderness: There is no abdominal tenderness.  Musculoskeletal:        General: No edema.     Cervical back: Normal range of motion and neck supple. No rigidity or tenderness.  Skin:    General: Skin is warm and dry.  Neurological:     General: No focal deficit present.     Mental Status: She is alert.  Psychiatric:        Mood and Affect: Mood and affect and mood normal.        Behavior: Behavior normal.     ED Results / Procedures / Treatments   Labs (all labs ordered are listed, but only abnormal results are displayed) Labs Reviewed  CBC WITH DIFFERENTIAL/PLATELET - Abnormal; Notable for the following components:      Result Value   WBC 19.7 (*)    MCV 72.3 (*)    MCH 24.5  (*)    Platelets 425 (*)    Neutro Abs 15.7 (*)    Abs Immature Granulocytes 0.09 (*)    All other components within normal limits  BASIC METABOLIC PANEL    EKG EKG Interpretation  Date/Time:  Thursday September 13 2020 20:21:55 EST Ventricular Rate:  84 PR Interval:  QRS Duration: 92 QT Interval:  396 QTC Calculation: 469 R Axis:   17 Text Interpretation: Age not entered, assumed to be  29 years old for purpose of ECG interpretation Sinus rhythm Borderline T abnormalities, anterior leads Artifact in lead(s) I II aVR no acute changes when compared to prior on may 2021 Confirmed by Madalyn Rob [phone]) on 09/13/2020 8:46:49 PM Also confirmed by Madalyn Rob [phone]), editor Hattie Perch ([id number])  on 09/14/2020 11:54:43 AM   Radiology DG Chest 2 View  Result Date: 09/13/2020 CLINICAL DATA:  Shortness of breath EXAM: CHEST - 2 VIEW COMPARISON:  01/16/2016 FINDINGS: The heart size and mediastinal contours are within normal limits. Both lungs are clear. Scoliosis. IMPRESSION: No active cardiopulmonary disease. Electronically Signed   By: Donavan Foil M.D.   On: 09/13/2020 20:17    Procedures Procedures (including critical care time)  Medications Ordered in ED Medications  ketorolac (TORADOL) 15 MG/ML injection 15 mg (15 mg Intravenous Given 09/13/20 2008)    ED Course  I have reviewed the triage vital signs and the nursing notes.  Pertinent labs & imaging results that were available during my care of the patient were reviewed by me and considered in my medical decision making (see chart for details).    MDM Rules/Calculators/A&P                         29 year old lady presenting to ER with concern for sore throat.  On exam patient is globally well-appearing with stable vital signs, afebrile; noted erythema and exudates in posterior oropharynx.  Based on exam, no evidence for deeper infection or PTA.  Her strep test was negative.  Labs noted for leukocytosis,  otherwise within normal limits.  Most likely viral however given exam and leukocytosis, will cover with course of antibiotics in case she has a nonstrep bacterial cause.  Penicillin allergy, will treat with azithromycin.  Reviewed return precautions in detail with patient, she is tolerating p.o. and well-appearing on reassessment, will discharge home.   After the discussed management above, the patient was determined to be safe for discharge.  The patient was in agreement with this plan and all questions regarding their care were answered.  ED return precautions were discussed and the patient will return to the ED with any significant worsening of condition.  Final Clinical Impression(s) / ED Diagnoses Final diagnoses:  Pharyngitis, unspecified etiology  Leukocytosis, unspecified type    Rx / DC Orders ED Discharge Orders         Ordered    ketorolac (TORADOL) 10 MG tablet  Every 8 hours PRN        09/13/20 2104    azithromycin (ZITHROMAX) 250 MG tablet  Daily        09/13/20 2105           Lucrezia Starch, MD 09/15/20 1119

## 2020-09-14 NOTE — Telephone Encounter (Signed)
Pharmacy called related to Rx: zithromax .Marland KitchenMarland KitchenEDCM clarified with EDP ([place]) to change Rx to: 250 mg tablets #6.  Take first two tablets together, then 1 every day until finished.

## 2020-09-18 ENCOUNTER — Ambulatory Visit (INDEPENDENT_AMBULATORY_CARE_PROVIDER_SITE_OTHER): Payer: Medicaid Other | Admitting: Advanced Practice Midwife

## 2020-09-18 ENCOUNTER — Encounter: Payer: Self-pay | Admitting: Advanced Practice Midwife

## 2020-09-18 ENCOUNTER — Other Ambulatory Visit: Payer: Self-pay

## 2020-09-18 VITALS — BP 129/85 | HR 76 | Ht 65.0 in | Wt 177.3 lb

## 2020-09-18 DIAGNOSIS — Z1331 Encounter for screening for depression: Secondary | ICD-10-CM

## 2020-09-18 DIAGNOSIS — Z3046 Encounter for surveillance of implantable subdermal contraceptive: Secondary | ICD-10-CM

## 2020-09-18 DIAGNOSIS — Z3049 Encounter for surveillance of other contraceptives: Secondary | ICD-10-CM

## 2020-09-18 DIAGNOSIS — Z30430 Encounter for insertion of intrauterine contraceptive device: Secondary | ICD-10-CM

## 2020-09-18 MED ORDER — LEVONORGESTREL 19.5 MCG/DAY IU IUD: INTRAUTERINE_SYSTEM | Freq: Once | INTRAUTERINE | Status: AC

## 2020-09-18 NOTE — Progress Notes (Signed)
GYNECOLOGY OFFICE PROCEDURE NOTE  Janice Duarte is a 29 y.o. G1P1001 here for Liletta IUD insertion. No GYN concerns.  Last pap smear was on 07/2019 and was normal.  IUD Insertion Procedure Note Patient identified, informed consent performed, consent signed.   Discussed risks of irregular bleeding, cramping, infection, malpositioning or misplacement of the IUD outside the uterus which may require further procedure such as laparoscopy. Time out was performed.  Urine pregnancy test negative.  Speculum placed in the vagina.  Cervix visualized.  Cleaned with Betadine x 2.  Grasped anteriorly with a single tooth tenaculum.  Uterus sounded to 7 cm.  Liletta IUD placed per manufacturer's recommendations.  Strings trimmed to 3 cm. Tenaculum was removed, good hemostasis noted.  Patient tolerated procedure well.   Patient was given post-procedure instructions.  She was advised to have backup contraception for one week.  Patient was also asked to check IUD strings periodically and follow up in 4 weeks for IUD check.   GYNECOLOGY OFFICE PROCEDURE NOTE   Nexplanon Removal Patient identified, informed consent performed, consent signed.   Appropriate time out taken. Nexplanon site identified.  Area prepped in usual sterile fashon. One ml of 1% lidocaine was used to anesthetize the area at the distal end of the implant. A small stab incision was made right beside the implant on the distal portion.  The Nexplanon rod was grasped using hemostats and removed without difficulty.  There was minimal blood loss. There were no complications.  3 ml of 1% lidocaine was injected around the incision for post-procedure analgesia.  Steri-strips were applied over the small incision.  A pressure bandage was applied to reduce any bruising.  The patient tolerated the procedure well and was given post procedure instructions.  Patient is planning to use IUD for contraception  Thressa Sheller DNP, CNM  09/18/20  4:26 PM

## 2020-09-18 NOTE — Addendum Note (Signed)
Addended by: Henrietta Dine on: 09/18/2020 04:56 PM   Modules accepted: Orders

## 2020-10-01 ENCOUNTER — Other Ambulatory Visit: Payer: Medicaid Other

## 2020-10-03 IMAGING — US US MFM OB FOLLOW-UP
1 series · 14 of 26 positions shown · non-contrast
Comparison: none

[Series 1: us mfm ob follow-up · 14 of 26 slices shown]
[im 1/26]
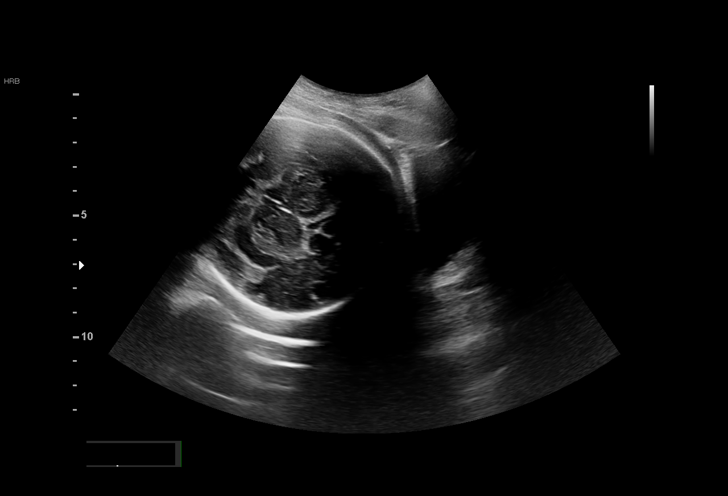
[im 3/26]
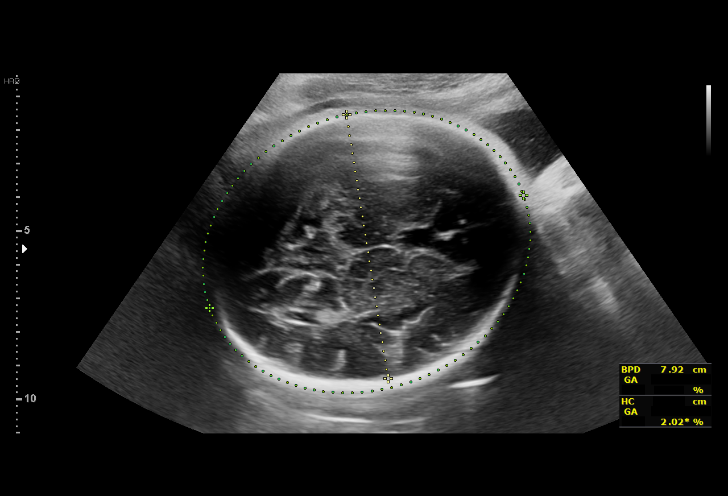
[im 5/26]
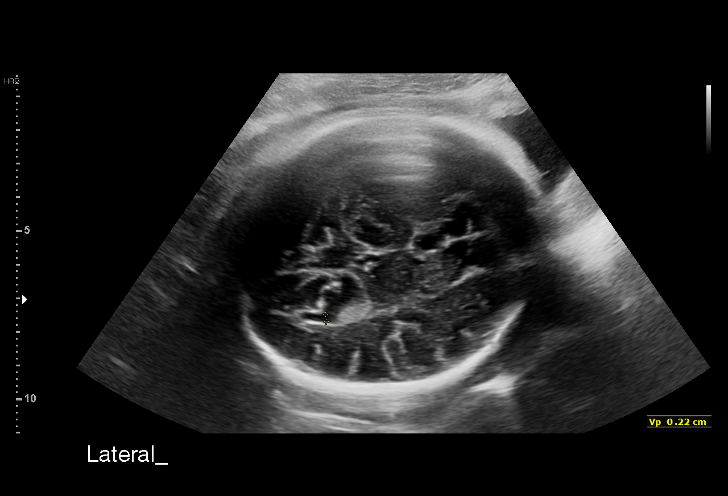
[im 7/26]
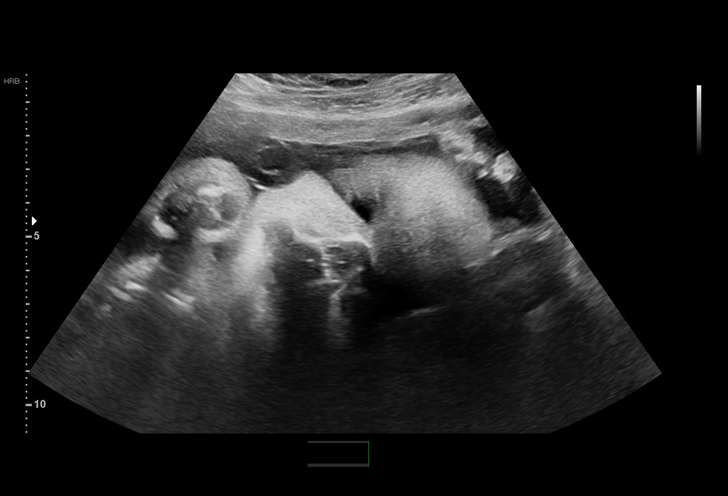
[im 9/26]
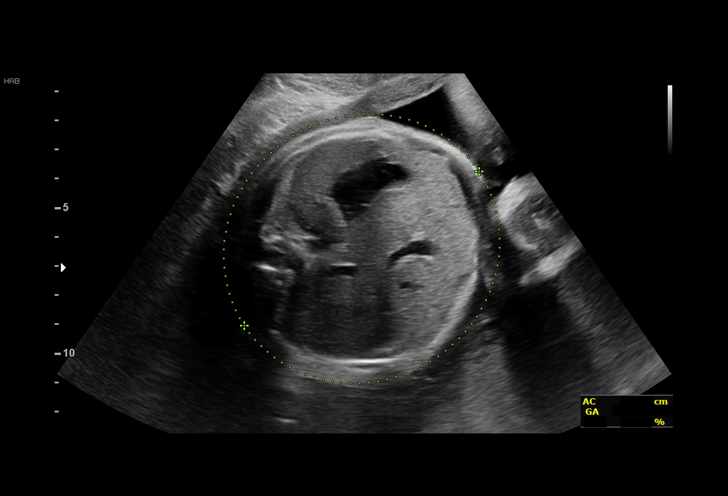
[im 11/26]
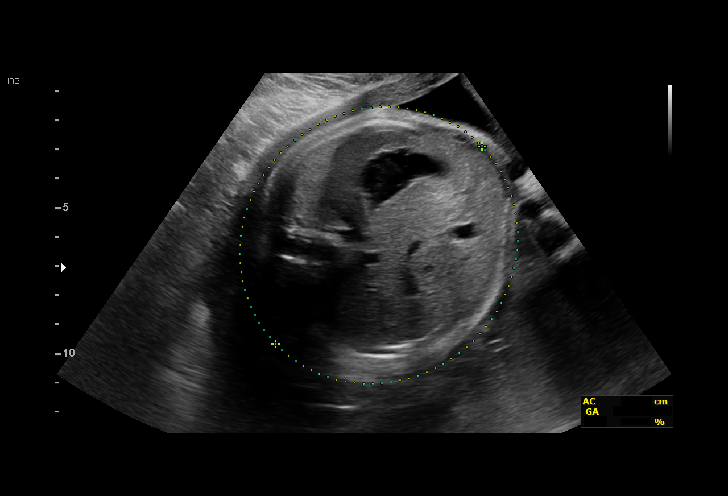
[im 13/26]
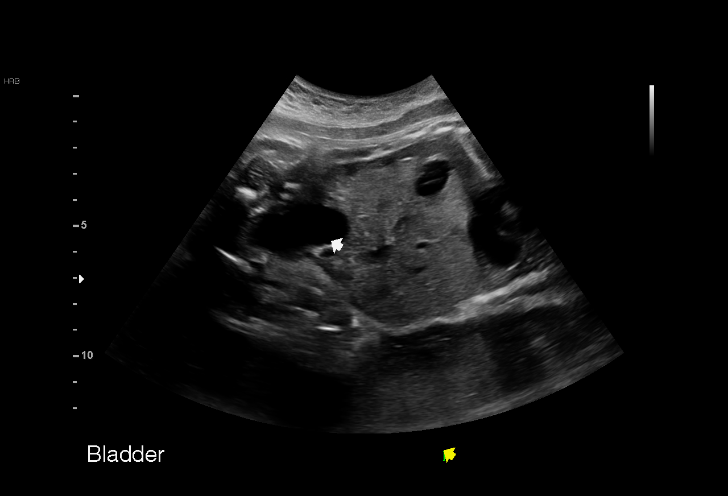
[im 14/26]
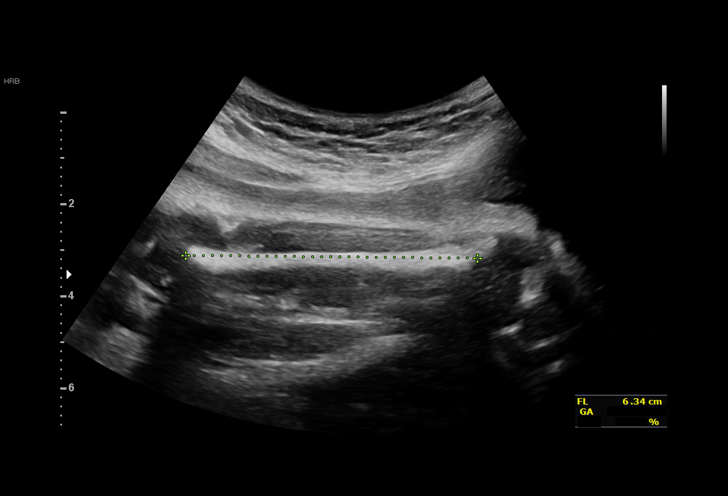
[im 16/26]
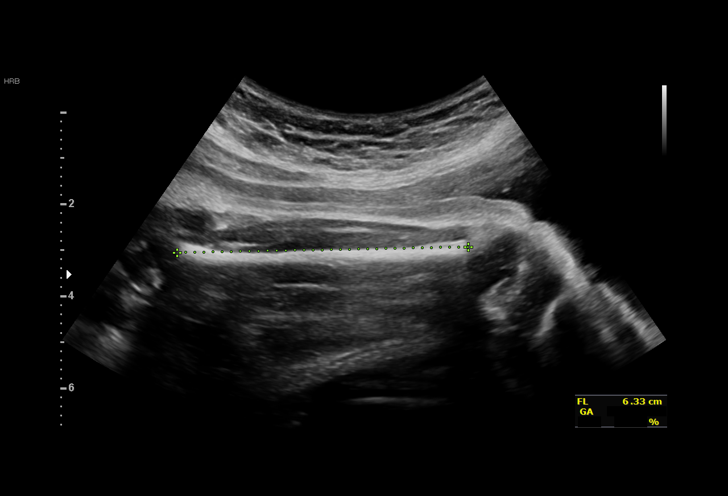
[im 18/26]
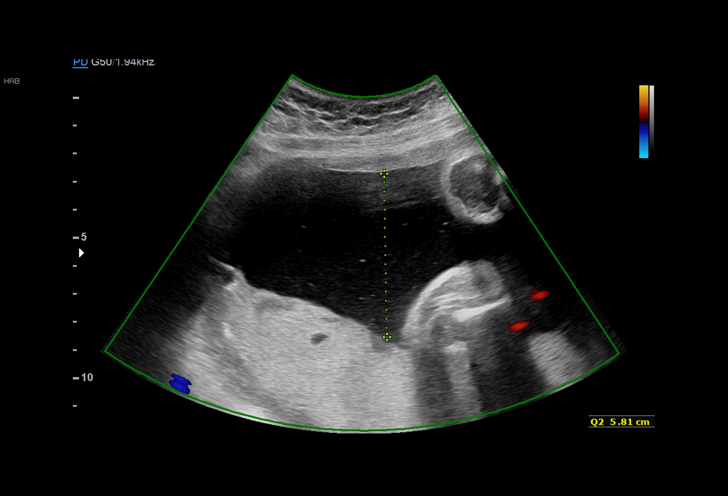
[im 20/26]
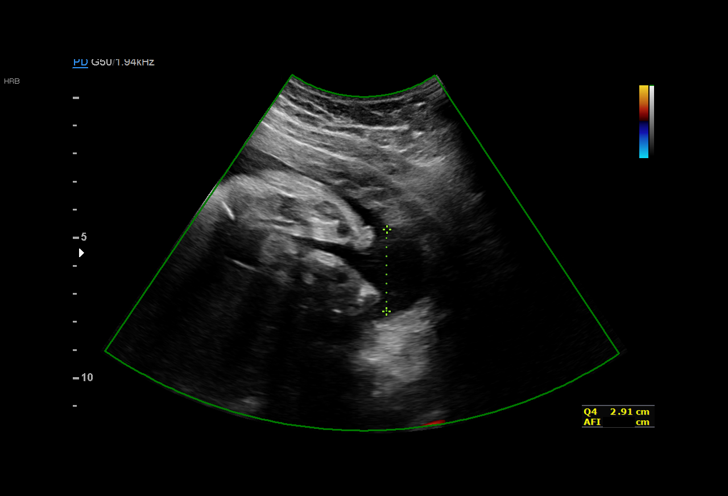
[im 22/26]
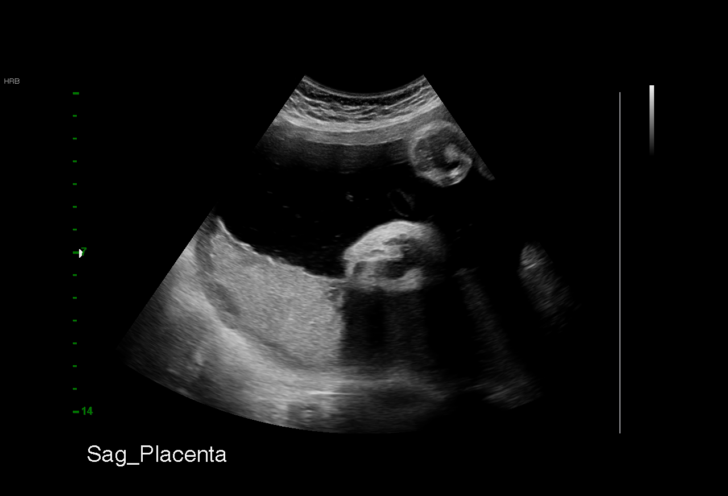
[im 24/26]
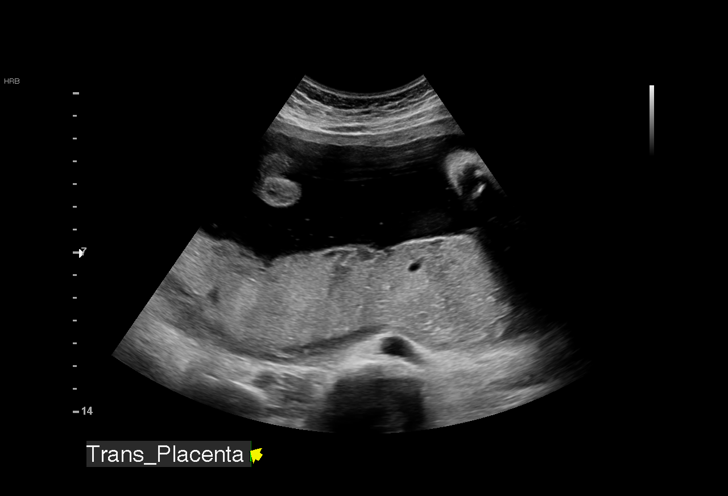
[im 26/26]
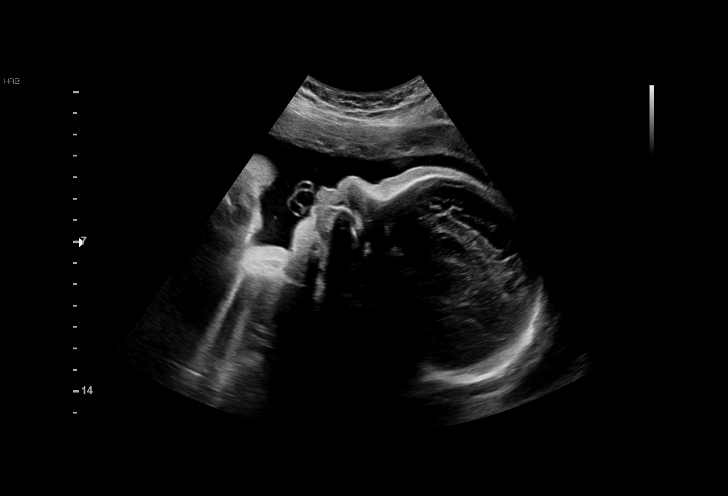

[14 of 26 positions shown; findings below may reference images not displayed]

----------------------------------------------------------------------

 ----------------------------------------------------------------------
Indications

  Hypertension - Chronic/Pre-existing (ASA)
  Genetic carrier (Silent [place])
  Genetic carrier (Sickle Cell Disease)
  Encounter for other antenatal screening
  follow-up (LR NIPS)
  32 weeks gestation of pregnancy
 ----------------------------------------------------------------------
Fetal Evaluation

 Num Of Fetuses:         1
 Cardiac Activity:       Observed
 Presentation:           Cephalic
 Placenta:               Posterior
 P. Cord Insertion:      Previously Visualized

 Amniotic Fluid
 AFI FV:      Within normal limits

 AFI Sum(cm)     %Tile       Largest Pocket(cm)
 18.62           69

 RUQ(cm)       RLQ(cm)       LUQ(cm)        LLQ(cm)

Biometry

 BPD:      79.3  mm     G. Age:  31w 6d         22  %    CI:        78.16   %    70 - 86
                                                         FL/HC:      22.3   %    19.9 -
 HC:      283.8  mm     G. Age:  31w 1d          2  %    HC/AC:      0.96        0.96 -
 AC:      296.2  mm     G. Age:  33w 4d         79  %    FL/BPD:     79.8   %    71 - 87
 FL:       63.3  mm     G. Age:  32w 5d         42  %    FL/AC:      21.4   %    20 - 24
 LV:        2.2  mm
 Est. FW:    4848  gm      4 lb 9 oz     51  %
OB History

 Gravidity:    1         Term:   0
 Living:       0
Gestational Age

 LMP:           32w 4d        Date:  05/26/19                 EDD:   03/01/20
 U/S Today:     32w 2d                                        EDD:   03/03/20
 Best:          32w 4d     Det. By:  LMP  (05/26/19)          EDD:   03/01/20
Anatomy

 Cranium:               Appears normal         Aortic Arch:            Previously seen
 Cavum:                 Appears normal         Ductal Arch:            Previously seen
 Ventricles:            Appears normal         Diaphragm:              Appears normal
 Choroid Plexus:        Previously seen        Stomach:                Appears normal, left
                                                                       sided
 Cerebellum:            Previously seen        Abdomen:                Previously seen
 Posterior Fossa:       Previously seen        Abdominal Wall:         Previously seen
 Nuchal Fold:           Previously seen        Cord Vessels:           Previously seen
 Face:                  Orbits and profile     Kidneys:                Appear normal
                        previously seen
 Lips:                  Previously seen        Bladder:                Appears normal
 Thoracic:              Previously seen        Spine:                  Previously seen
 Heart:                 Previously seen        Upper Extremities:      Previously seen
 RVOT:                  Previously seen        Lower Extremities:      Previously seen
 LVOT:                  Previously seen

 Other:  Male gender previously vis. Nasal bone previously visualized.
         Technically difficult due to advanced GA and fetal position.
Cervix Uterus Adnexa

 Cervix
 Not visualized (advanced GA >66wks)
Comments

 This patient was seen for a follow up growth scan due to a
 history of chronic hypertension that is not currently treated
 with any medications.  She denies any problems since her
 last exam.
 She was informed that the fetal growth and amniotic fluid
 level appears appropriate for her gestational age.
 A follow up exam was scheduled in 4 weeks.

## 2020-10-04 NOTE — BH Specialist Note (Deleted)
Integrated Behavioral Health via Telemedicine Visit  10/04/2020 Janice Duarte [id number]  Number of Integrated Behavioral Health visits: *** Session Start time: 3:15***  Session End time: 4:15*** Total time: {IBH Total Time:21014050}  Referring Provider: *** Patient/Family location: Home*** [facility] Provider location: [facility]  All persons participating in visit: Patient *** and [name] Janice Duarte ***  Types of Service: {CHL AMB TYPE OF [phone]}  I connected with Janice Duarte and/or Janice Duarte's {family members:20773} by Telephone  (Video is Surveyor, mining) and verified that I am speaking with the correct person using two identifiers.Discussed confidentiality: {YES/NO:21197}  I discussed the limitations of telemedicine and the availability of in person appointments.  Discussed there is a possibility of technology failure and discussed alternative modes of communication if that failure occurs.  I discussed that engaging in this telemedicine visit, they consent to the provision of behavioral healthcare and the services will be billed under their insurance.  Patient and/or legal guardian expressed understanding and consented to Telemedicine visit: {YES/NO:21197}  Presenting Concerns: Patient and/or family reports the following symptoms/concerns: *** Duration of problem: ***; Severity of problem: {Mild/Moderate/Severe:20260}  Patient and/or Family's Strengths/Protective Factors: {CHL AMB BH PROTECTIVE [phone]}  Goals Addressed: Patient will: 1.  Reduce symptoms of: {IBH Symptoms:21014056}  2.  Increase knowledge and/or ability of: {IBH Patient Tools:21014057}  3.  Demonstrate ability to: {IBH Goals:21014053}  Progress towards Goals: {CHL AMB BH PROGRESS TOWARDS [phone]}  Interventions: Interventions utilized:  {IBH Interventions:21014054} Standardized Assessments  completed: {IBH Screening Tools:21014051}  Patient and/or Family Response: ***  Assessment: Patient currently experiencing ***.   Patient may benefit from ***.  Plan: 1. Follow up with behavioral health clinician on : *** 2. Behavioral recommendations: *** 3. Referral(s): {IBH Referrals:21014055}  I discussed the assessment and treatment plan with the patient and/or parent/guardian. They were provided an opportunity to ask questions and all were answered. They agreed with the plan and demonstrated an understanding of the instructions.   They were advised to call back or seek an in-person evaluation if the symptoms worsen or if the condition fails to improve as anticipated.  Valetta Close Lynnley Doddridge, LCSW

## 2020-10-10 ENCOUNTER — Ambulatory Visit (INDEPENDENT_AMBULATORY_CARE_PROVIDER_SITE_OTHER): Payer: Medicaid Other | Admitting: Advanced Practice Midwife

## 2020-10-10 ENCOUNTER — Other Ambulatory Visit: Payer: Self-pay

## 2020-10-10 ENCOUNTER — Encounter: Payer: Self-pay | Admitting: Advanced Practice Midwife

## 2020-10-10 VITALS — BP 128/86 | HR 83 | Wt 183.0 lb

## 2020-10-10 DIAGNOSIS — Z30431 Encounter for routine checking of intrauterine contraceptive device: Secondary | ICD-10-CM

## 2020-10-10 NOTE — Progress Notes (Signed)
GYNECOLOGY OFFICE PROGRESS NOTE  History:  30 y.o. G1P1001 here today for today for IUD string check; Liletta IUD was placed  09/18/20.   Patient states that on 10/08/2020 she started feeling poking from the IUD and sees something blue hanging out of her vagina.    The following portions of the patient's history were reviewed and updated as appropriate: allergies, current medications, past family history, past medical history, past social history, past surgical history and problem list. Last pap smear on 07/2019 was normal, negative HRHPV.  Review of Systems:  Pertinent items are noted in HPI.   Objective:  Physical Exam Blood pressure 128/86, pulse 83, weight 183 lb (83 kg), not currently breastfeeding. CONSTITUTIONAL: Well-developed, well-nourished female in no acute distress.  HENT:  Normocephalic, atraumatic. External right and left ear normal. Oropharynx is clear and moist EYES: Conjunctivae and EOM are normal. Pupils are equal, round, and reactive to light. No scleral icterus.  NECK: Normal range of motion, supple, no masses CARDIOVASCULAR: Normal heart rate noted RESPIRATORY: Effort and breath sounds normal, no problems with respiration noted ABDOMEN: Soft, no distention noted.   PELVIC: Normal appearing external genitalia; normal appearing vaginal mucosa and cervix.  IUD strings visualized, about 5 cm in length outside cervix. Strings trimmed to about 3cm   After the strings were trimmed, and after patient sat up after exam and string trim. She reports that the pain is gone now.   Assessment & Plan:  Normal IUD check. Patient to keep IUD in place for five years; can come in for removal if she desires pregnancy within the next five years. Routine preventative health maintenance measures emphasized.  Thressa Sheller DNP, CNM  10/10/20  4:31 PM

## 2020-10-12 ENCOUNTER — Telehealth: Payer: Self-pay | Admitting: Clinical

## 2020-10-12 NOTE — Telephone Encounter (Signed)
Called pt to reschedule appt for 10/16/2019 due to the [facility] being closed due to inclement weather. Pt has been rescheduled for Thursday 10/18/2020 at 3:15pm. Pt requests this be a phone call rather than a virtual visit due to internet. Pt was also sent a MyChart message relaying this.   Darcel Smalling [facility] Intern) Hulda Marin (Supervisor)

## 2020-10-16 NOTE — BH Specialist Note (Signed)
Integrated Behavioral Health via Telemedicine Visit  10/16/2020 [name] [id number]  Number of Integrated Behavioral Health visits: 1 Session Start time: 3:18  Session End time: 3:28 Total time: 10  Referring Provider: Luna Kitchens, CNM Patient/Family location: Home [facility] Provider location: [facility]  All persons participating in visit: Patient Janice Duarte and [facility] Ananth Fiallos   Types of Service: Individual psychotherapy  I connected with Rosario S Coulthard and/or Ethleen S Konz's n/a by Telephone  (Video is Surveyor, mining) and verified that I am speaking with the correct person using two identifiers.Discussed confidentiality: Yes   I discussed the limitations of telemedicine and the availability of in person appointments.  Discussed there is a possibility of technology failure and discussed alternative modes of communication if that failure occurs.  I discussed that engaging in this telemedicine visit, they consent to the provision of behavioral healthcare and the services will be billed under their insurance.  Patient and/or legal guardian expressed understanding and consented to Telemedicine visit: Yes   Presenting Concerns: Patient and/or family reports the following symptoms/concerns: Pt states her primary goals today are to find housing and a PCP; pt is working with a peer support person helping her with housing and counseling.  Duration of problem: Ongoing; Severity of problem: moderate  Patient and/or Family's Strengths/Protective Factors: Social connections and Concrete supports in place (healthy food, safe environments, etc.)  Goals Addressed: Patient will: 1.  Reduce symptoms of: stress  2.  Increase knowledge and/or ability of: stress reduction  3.  Demonstrate ability to: Increase healthy adjustment to current life circumstances  Progress towards  Goals: Ongoing  Interventions: Interventions utilized:  Solution-Focused Strategies Standardized Assessments completed: Not Needed  Patient and/or Family Response: Pt agrees with treatment plan  Assessment: Patient currently experiencing Psychosocial stress.   Patient may benefit from link to community resources today.  Plan: 1. Follow up with behavioral health clinician on : As needed 2. Behavioral recommendations:  -Continue working with agency to obtain housing; consider additional housing resources on After Visit Summary -Consider establishing with PCP (list of potential PCP's on AVS) -Continue using peer support person for supportive counseling; consider establishing with ongoing therapist (list of potential therapy options on AVS) 3. Referral(s): Integrated [employer] (In Clinic)  I discussed the assessment and treatment plan with the patient and/or parent/guardian. They were provided an opportunity to ask questions and all were answered. They agreed with the plan and demonstrated an understanding of the instructions.   They were advised to call back or seek an in-person evaluation if the symptoms worsen or if the condition fails to improve as anticipated.  Rae Lips, LCSW   Depression screen [facility] 2/9 10/18/2020 10/10/2020 09/18/2020 03/26/2020 03/07/2020  Decreased Interest 3 2 2 2 2   Down, Depressed, Hopeless 3 3 3 3 3   PHQ - 2 Score 6 5 5 5 5   Altered sleeping 2 0 1 3 1   Tired, decreased energy 3 3 2 3 2   Change in appetite 3 2 2 1  0  Feeling bad or failure about yourself  3 3 3 1 2   Trouble concentrating 2 2 0 0 0  Moving slowly or fidgety/restless 0 0 1 1 0  Suicidal thoughts 0 0 0 0 0  PHQ-9 Score 19 15 14 14 10   Some recent data might be hidden   GAD 7 : Generalized Anxiety Score 10/18/2020 10/10/2020 09/18/2020 03/26/2020  Nervous, Anxious, on Edge 3  3 1 1   Control/stop worrying 3 3 2 1   Worry too much - different things 3 3 3 3   Trouble  relaxing 2 2 1 3   Restless 2 2 0 1  Easily annoyed or irritable 3 3 3 3   Afraid - awful might happen 1 2 0 0  Total GAD 7 Score 17 18 10  12

## 2020-10-17 ENCOUNTER — Encounter: Payer: Self-pay | Admitting: Student

## 2020-10-17 ENCOUNTER — Other Ambulatory Visit: Payer: Self-pay

## 2020-10-17 ENCOUNTER — Ambulatory Visit (INDEPENDENT_AMBULATORY_CARE_PROVIDER_SITE_OTHER): Payer: Medicaid Other | Admitting: Student

## 2020-10-17 VITALS — BP 111/78 | HR 98 | Wt 182.3 lb

## 2020-10-17 DIAGNOSIS — R300 Dysuria: Secondary | ICD-10-CM

## 2020-10-18 ENCOUNTER — Ambulatory Visit: Payer: Medicaid Other | Admitting: Clinical

## 2020-10-18 DIAGNOSIS — Z658 Other specified problems related to psychosocial circumstances: Secondary | ICD-10-CM

## 2020-10-18 NOTE — Patient Instructions (Signed)
Center for [facility] at [facility] for Women [address] [place], [place] [address] [phone] (main office) [phone] (Miliani Deike's office)  [place] primary care offices accepting new patients:   Primary Care at [facility] [place] [address] [place], [place] [address] [phone]  [facility] [address] [place], [place] [address] [phone]  [facility] [address] [place], [place] [address] [phone]  [facility] [address] [place], [place] [address] [phone]  Patient Care Center [address] [place],  [place]  [address] [phone]  Housing Resources                    [address] [facility] [address] (serves [place], [place], [place], [place], [place], [place], [place], [place], [place], [place], [place], [place], and [place] counties) [address], [place], [place] [address] [phone] DeveloperU.ch  **Rental assistance, Home Rehabilitation,Weatherization Assistance Program, Chief Financial Officer, Housing Voucher Program   Housing Resources [facility]  Housing Authority- [place] [address], [place], [place] [address] [phone] www.gha-[place].[facility] [address] [place]y Ho[place], [place] [address] [phone] PhoneCaptions.ch **Programs include: Hospital doctor and Housing Counseling, Healthy Radiographer, therapeutic, Homeless Prevention and Housing Assistance  Government [facility] [address], [place], [place] [address] [phone] www.PaintingEmporium.co.za **housing applications/recertification; tax payment relief/exemption under specific qualifications  [facility] [address], [place], [place] [address] www.onlinegreensboro.com/~maryshouse **transitional housing for women in recovery who have  minor children or are pregnant  [facility] [address] [place], [place], [place] [address] https://Piano-smith.net/  **emergency shelter and support services for families facing homelessness  [facility] [address] [place], [place] [address] [phone] www.youthfocus.org **transitional housing to pregnant women; emergency housing for youth who have run away, are experiencing a family crisis, are victims of abuse or neglect, or are homeless  [facility] [address] [place], [place] [address] [phone] ircgso.org **Drop-in center for people experiencing homelessness; overnight warming center when temperature is 25 degrees or below  [employer] [address] [place], [place], [place] [address] [phone] https://reentrystaffingagency.org/ **help with affordable housing to people experiencing homelessness or unemployment due to incarceration  [facility] [address] [place], [place] [address] [phone] www.greensborourbanministry.org  **emergency and transitional housing, rent/mortgage assistance, utility assistance  [facility] [address], [place], [place] [address] [phone] www.salvationarmyofgreensboro.org **emergency and transitional housing  [employer] for [employer] [address], [place], [place] [address] [phone] Www.habitatgreensboro.[facility]   [employer] [address], [place], [place] [address] [phone] https://chshousing.org **Home Ownership/Affordable Housing Program and [facility]  [facility] [facility] [address], [place], [place] [address] [phone] PaidValue.com.cy **home buyer education courses, foreclosure prevention  [facility] [address] [place], [place] [address] [phone] DefMagazine.is **Environmental Exposure Assessment (investigation of homes where either children or pregnant women with a confirmed elevated blood lead level reside)  [facility] [address] [place], [place] [address] [phone] ShowReturn.ca **Home Expense Assistance/Repairs Program; offers home accessibility updates, such as ramps or bars in the bathroom  [facility] [address], [place], [place] [address] [phone] https://www.self-help.org/locations/-branch **Offers credit-building and banking services to people unable to use traditional banking  Behavioral Health Resources:   What if I or someone I know is in crisis?  Marland Kitchen  If you are thinking about harming yourself or having thoughts of suicide, or if you know someone who is, seek help right away.  . Call your doctor or mental health care provider.  . Call 911 or go to a hospital emergency room to get immediate help, or ask a friend or family member to help you do these things; IF YOU ARE IN [place], YOU MAY GO TO WALK-IN URGENT CARE 24/7 at [facility] (see below)  . Call the [place] National Suicide Prevention Lifeline's toll-free, 24-hour hotline at 1-800-273-TALK (978)304-8657) or TTY: 1-800-799-4 TTY 403 876 8951) to talk to a trained counselor.  . If you are in crisis, make sure you are not left alone.   . If someone else is in crisis, make sure he or she is not left alone   24 Hour :   [facility]  [address] [place], [place] [address] [phone] or [phone] WALK-IN URGENT CARE 24/7  Therapeutic Alternative Mobile Crisis: [phone]  [place] National Suicide Hotline: 316-289-7620  Family Service of the [employer] (Domestic Violence, Rape & Victim Assistance)   706-367-4300  [facility]  [address], [place]  [address]   [phone] or [phone]   [facility] [employer] Crisis Services: [phone] (8am-4pm) or [phone] (after hours)        [facility], [address], [place], [place]  [phone] Fax: [phone] guilfordcareinmind.com *Interpreters available *Accepts all insurance and uninsured for Urgent Care needs *Accepts Medicaid and uninsured for outpatient treatment   [facility]   Mon-Fri: 8am-5pm [address], [place], [place] [phone](SFKCL); [phone](fax) https://www.arroyo.com/  *Accepts Medicare  [facility] [place], Fri: 8am-4pm [address], [place], [place] [address] [phone] (phone); [phone] (fax) ExShows.dk  *Accepts Medicare  [facility] Mon-Fri: 9am-5pm  [address], [place], [place] [phone] (phone); [phone]  MommyCollege.dk  *Accepts Medicaid  Jovita Kussmaul Total Access [facility] [address] [place], [place]  [phone] https://www.grant.info/   [facility] [place], 8:30am-12pm/1pm-2:30pm [address] [place] [address] [place], [place] [phone] (phone); [phone] (fax) www.fspcares.org  *Accepts Medicaid, sliding-scale*Bilingual services available  [facility] Mon-Fri, 8am-7pm [address] [place], [place]  [phone](XMIWO); [phone](fax) www.famsolutions.org  *Accepts Medicaid *Bilingual services available  [facility] Mon-Fri: 8am-5pm, Saturday by appointment only [address] [place], [place], [place] [phone] (phone); [phone] (fax) www.journeyscounselinggso.com   [facility] [address], [place],  [place] [phone] www.kellinfoundation.org  *Free & reduced services for uninsured and underinsured individuals *Bilingual services for Spanish-speaking clients 21 and under  [facility], [address] [place], [place] [phone](AXKPV); ([phone](fax) KittenExchange.at  *Bring your own interpreter at first visit *Accepts Medicare and [facility]  [facility] Mon-Fri: 9am-5:30pm [address], [place], [place] [phone] (phone), [phone] (fax) After hours crisis line: [phone] www.neuropsychcarecenter.com  *Accepts Medicare and Medicaid  [employer], 8am-6pm [address] [place], [place]  [phone] (phone); [phone] (fax) http://presbyteriancounseling.org  *Subsidized costs available  [facility] Mon-Fri: 8am-4pm [address], [place], [place] [phone](KMMNO); [phone](fax) www.psychotherapeuticservices.com  *Accepts Medicaid  [facility] Same day access hours: Mon-Fri, 8:30-3pm Crisis hours: Mon-Fri, 8am-5pm [address] [place], [place] [phone]  [facility] [employer] Same day access hours: Mon-Fri, 8:30-3pm Crisis hours: Mon-Fri, 8am-8pm [address] [place], [place] [phone] (phone); [phone] (fax) www.rhahealthservices.org  *  Accepts Medicaid and Medicare  [facility] [place], [place], Fri: 9am-9pm Tues, Thurs: 9am-6pm [address] [place], [place], [place]  [phone] (phone); [phone] (fax) https://ringercenter.com  *(Accepts Medicare and Medicaid; payment plans available)*Bilingual services available  [facility] [address], [place], [place] [phone] (phone); [phone] (fax) www.santecounseling.com   [facility] [address], [place], [place]  [phone]  RackRewards.fr   *Bilingual services available  [facility] (Social and Emotional Learning) Mon-Thurs: 8am-8pm [address], [place], [place] [phone] (phone); [phone] (fax) ScrapbookLive.si  *Accepts Medicaid*Bilingual services available  [facility] [address] [place], [place] [phone] (phone) BrotherBig.at  *Accepts Medicaid *Bilingual services available  [facility] Mon-Fri, 9am-4:45pm [address] [place], [place] [phone] (phone); [phone] (fax) http://tlc-counseling.com  *Accepts Medicare  [facility] Mon-Thurs: 8:30-8pm, Fri: 8:30am-7pm [address] [place], [place] (3rd floor) [phone] (phone); [phone] (fax) https://www.warren.info/  *Accepts Medicaid; income-based reduced rates available  [facility] Mon-Fri: 8am-5pm [address], [place], [place] [address] [phone] (phone); [phone] (fax) http://www.wrightscareservices.com  *Accepts Medicaid*Bilingual services available   [facility] [place])  [address], [place] [phone] www.mhag.org  *Provides direct services to individuals in recovery from mental illness, including support groups, recovery skills classes, and one on one peer support  [employer] on Mental Illness) [place] helpline: 819-274-5461  NAMI  helpline: 931 111 4286 https://namiguilford.org  *A community hub for information relating to local resources and services for the friends and families of individuals living alongside a mental health condition, as well as the individuals themselves. Classes and support groups also provided

## 2020-10-19 ENCOUNTER — Telehealth: Payer: Self-pay

## 2020-10-19 LAB — URINE CULTURE

## 2020-10-19 NOTE — Telephone Encounter (Signed)
Left message for pt to return call to triage RN. 

## 2020-10-19 NOTE — Progress Notes (Signed)
°  History:  Ms. SACHE SANE is a 30 y.o. G1P1001 who presents to clinic today for feeling "discomfort" after she urinates. She wants to know if she has a UTI. She denies abnormal vaginal discharge, pelvic pain, dysuria, cloudy urine, hematuria. This has been going on for 6 months.   The following portions of the patient's history were reviewed and updated as appropriate: allergies, current medications, family history, past medical history, social history, past surgical history and problem list.  Review of Systems:  Review of Systems  All other systems reviewed and are negative.     Objective:  Physical Exam BP 111/78    Pulse 98    Wt 182 lb 4.8 oz (82.7 kg)    BMI 30.34 kg/m  Physical Exam Vitals reviewed.  Constitutional:      Appearance: Normal appearance.  Neurological:     General: No focal deficit present.     Mental Status: She is alert and oriented to person, place, and time.       Labs and Imaging No results found for this or any previous visit (from the past 24 hour(s)).  No results found.   Assessment & Plan:  1. Dysuria -UA in office was negative for nitrites; will send urine for culture. Patient declined other testing. Discussed that could be physiologic changes after childbirth, possible prolapse.Patient declined exam.   - Urine Culture - Ambulatory referral to Urology    Approximately 5  minutes of total time was spent with this patient on counseling and coordination of care.   Marylene Land, CNM 10/19/2020 11:03 AM

## 2020-10-19 NOTE — Telephone Encounter (Signed)
Pt sent mychart message:  Question regarding URINE CULTURE Received: Today Laural Benes, Janice Duarte  P [facility] Clinical Pool What does comment mean

## 2020-10-26 ENCOUNTER — Telehealth: Payer: Self-pay

## 2020-10-26 DIAGNOSIS — R300 Dysuria: Secondary | ICD-10-CM

## 2020-10-26 NOTE — Telephone Encounter (Signed)
Send pt mychart message on 10/26/20 with results per Paulina Fusi, CNM  Encounter closed

## 2020-10-26 NOTE — Telephone Encounter (Signed)
Call placed to [facility] to make appt for pt.  Pt scheduled with Dr Alvester Morin on 2/22 at 1145 am. Pt notified of appt through [employer].  Records sent via fax  by front office. Routing to K.Crisoforo Oxford, CNM for review Encounter closed

## 2020-11-01 IMAGING — US US MFM OB FOLLOW-UP
1 series · 14 of 27 positions shown · non-contrast
Comparison: none

[Series 1: us mfm ob follow-up · 14 of 27 slices shown]
[im 1/27]
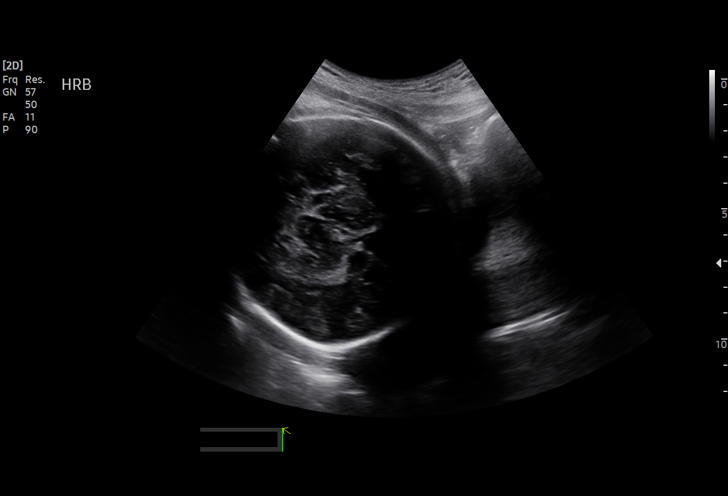
[im 3/27]
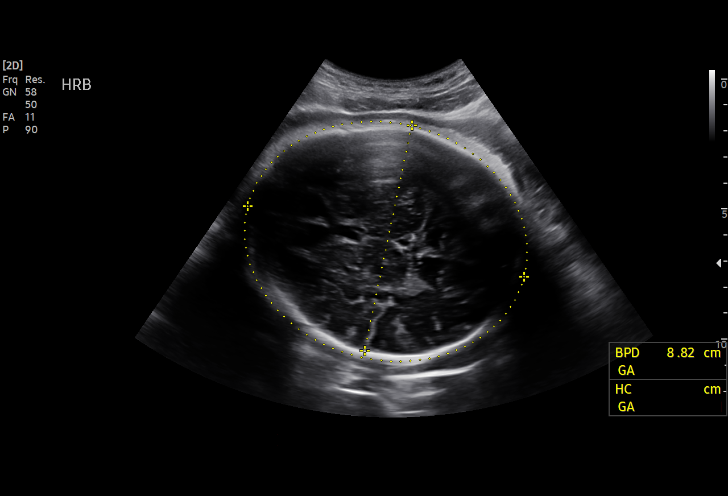
[im 5/27]
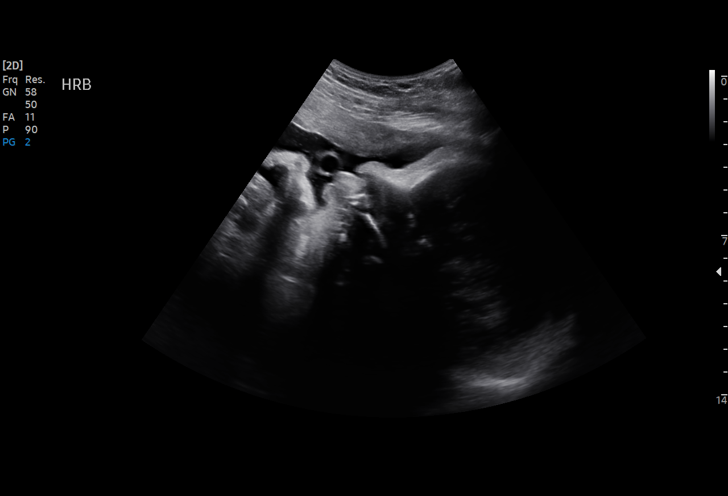
[im 7/27]
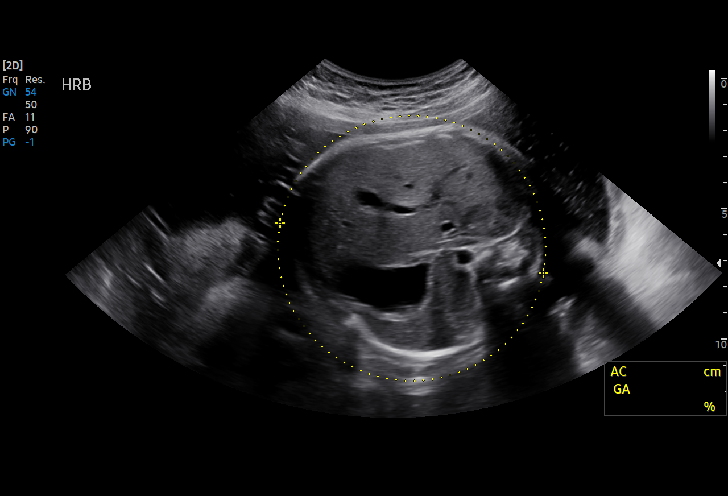
[im 9/27]
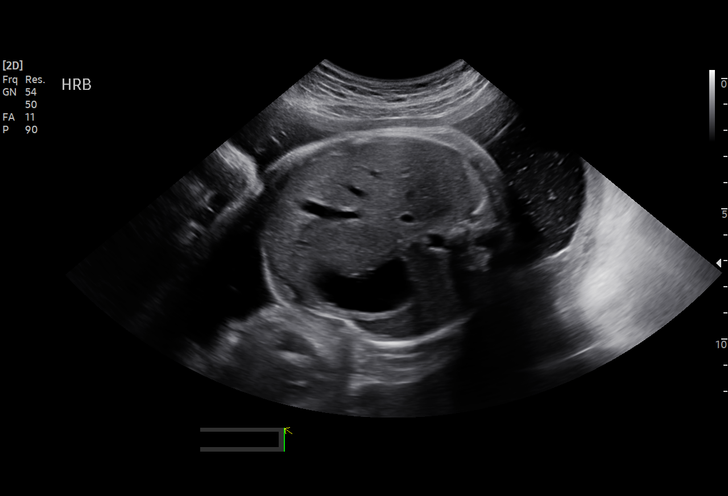
[im 11/27]
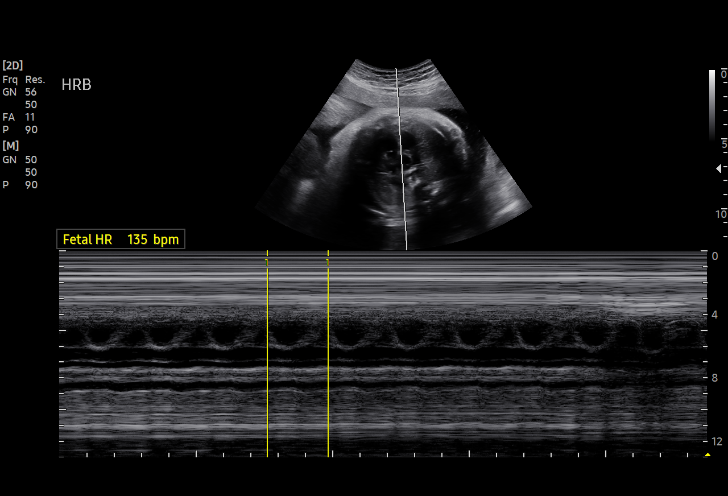
[im 13/27]
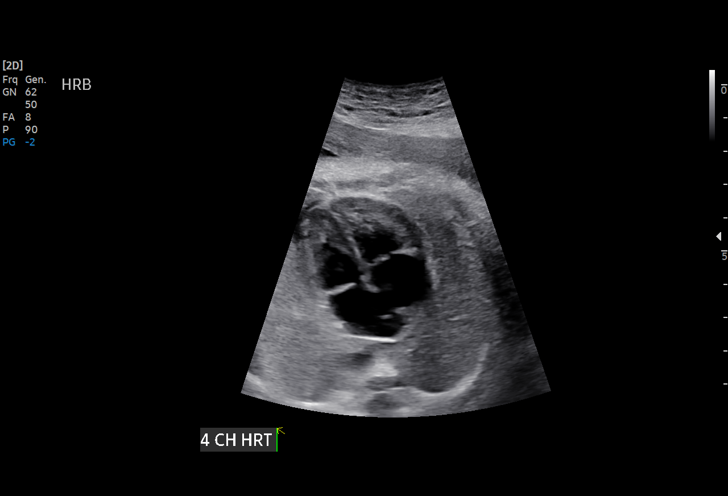
[im 15/27]
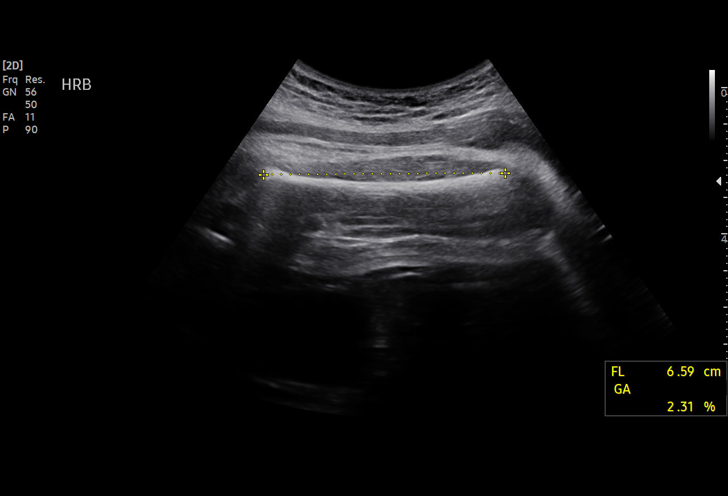
[im 17/27]
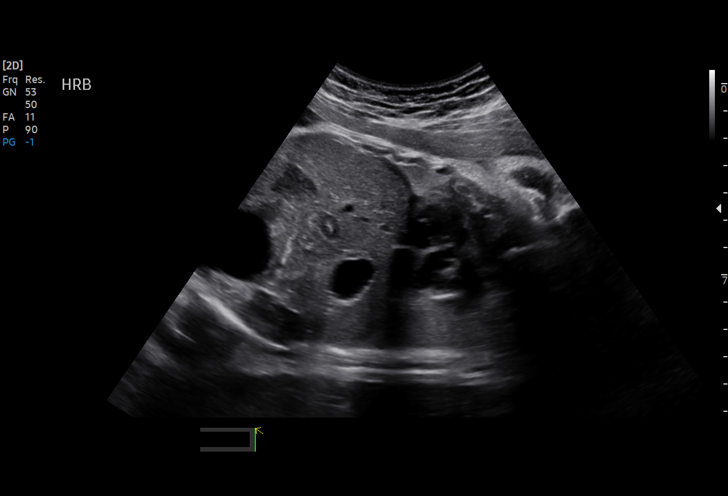
[im 19/27]
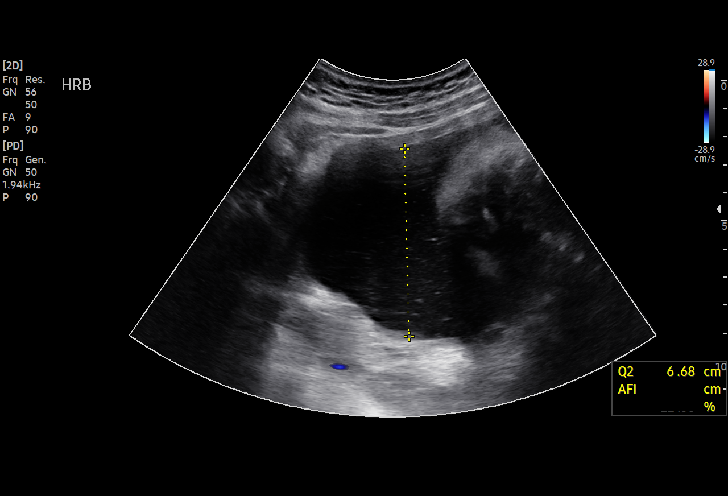
[im 21/27]
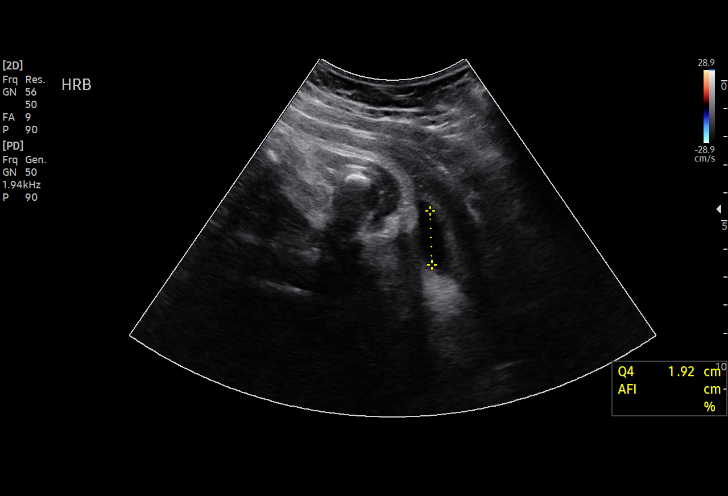
[im 23/27]
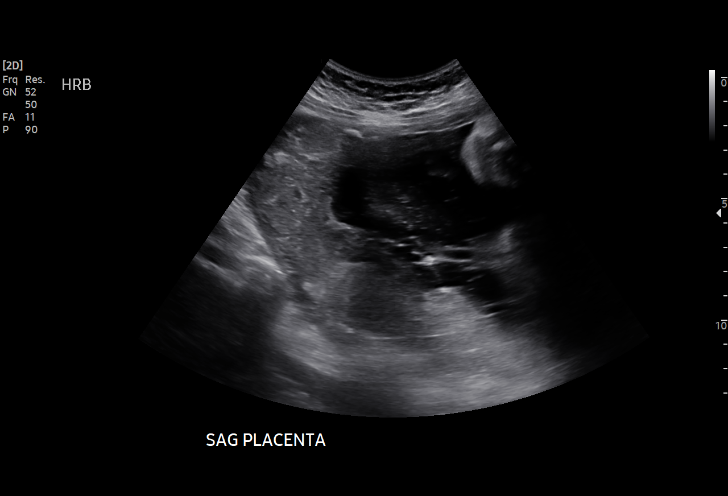
[im 25/27]
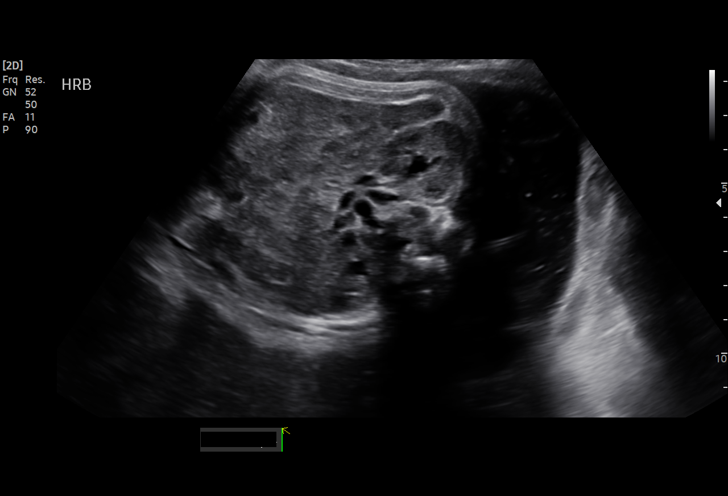
[im 27/27]
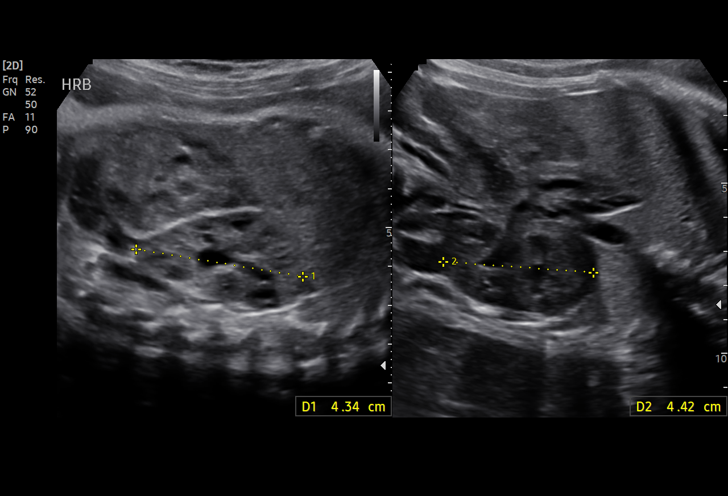

[14 of 27 positions shown; findings below may reference images not displayed]

Indications

 Hypertension - Chronic/Pre-existing (ASA)
 Genetic carrier (Silent Charlette Arista)
 Genetic carrier (Sickle Cell Disease)
 Encounter for other antenatal screening
 follow-up (LR NIPS)
 36 weeks gestation of pregnancy
Vital Signs

 BMI:
Fetal Evaluation

 Num Of Fetuses:          1
 Fetal Heart Rate(bpm):   135
 Cardiac Activity:        Observed
 Presentation:            Cephalic
 Placenta:                Posterior
 P. Cord Insertion:       Previously Visualized

 Amniotic Fluid
 AFI FV:      Within normal limits

 AFI Sum(cm)     %Tile       Largest Pocket(cm)
 18.4            70

 RUQ(cm)       RLQ(cm)       LUQ(cm)        LLQ(cm)

Biometry

 BPD:      87.8  mm     G. Age:  35w 3d         28  %    CI:        78.52   %    70 - 86
                                                         FL/HC:       21.5  %    20.8 -
 HC:      313.4  mm     G. Age:  35w 1d        3.2  %    HC/AC:       0.99       0.92 -
 AC:      317.7  mm     G. Age:  35w 5d         33  %    FL/BPD:      76.7  %    71 - 87
 FL:       67.3  mm     G. Age:  34w 4d          7  %    FL/AC:       21.2  %    20 - 24

 Est. FW:    7403   gm   5 lb 13 oz      20  %
OB History

 Gravidity:    1         Term:   0
 Living:       0
Gestational Age

 LMP:           36w 5d        Date:  05/26/19                 EDD:   03/01/20
 U/S Today:     35w 2d                                        EDD:   03/11/20
 Best:          36w 5d     Det. By:  LMP  (05/26/19)          EDD:   03/01/20
Anatomy

 Cranium:               Appears normal         Aortic Arch:            Previously seen
 Cavum:                 Appears normal         Ductal Arch:            Previously seen
 Ventricles:            Appears normal         Diaphragm:              Appears normal
 Choroid Plexus:        Previously seen        Stomach:                Appears normal, left
                                                                       sided
 Cerebellum:            Previously seen        Abdomen:                Appears normal
 Posterior Fossa:       Previously seen        Abdominal Wall:         Previously seen
 Nuchal Fold:           Previously seen        Cord Vessels:           Previously seen
 Face:                  Orbits and profile     Kidneys:                Appear normal
                        previously seen
 Lips:                  Appears normal         Bladder:                Appears normal
 Thoracic:              Appears normal         Spine:                  Previously seen
 Heart:                 Appears normal         Upper Extremities:      Previously seen
                        (4CH, axis, and
                        situs)
 RVOT:                  Previously seen        Lower Extremities:      Previously seen
 LVOT:                  Previously seen

 Other:  Male gender previously vis. Nasal bone previously visualized.
         Technically difficult due to advanced GA and fetal position.
Cervix Uterus Adnexa

 Cervix
 Not visualized (advanced GA >65wks)
Impression

 Normal interval growth seen today. Follow up performed
 secondary to hypertension not requiring medical therapy.
 Good amniotic fluid and movement observed today
Recommendations

 Follow up as clinically indicated.

## 2020-12-26 ENCOUNTER — Ambulatory Visit: Payer: Medicaid Other | Admitting: Obstetrics and Gynecology

## 2021-01-03 ENCOUNTER — Other Ambulatory Visit (HOSPITAL_COMMUNITY)
Admission: RE | Admit: 2021-01-03 | Discharge: 2021-01-03 | Disposition: A | Discharge status: Home / Self Care | Payer: Medicaid Other | Source: Ambulatory Visit | Attending: Obstetrics and Gynecology | Admitting: Obstetrics and Gynecology

## 2021-01-03 ENCOUNTER — Ambulatory Visit (INDEPENDENT_AMBULATORY_CARE_PROVIDER_SITE_OTHER): Payer: Medicaid Other | Admitting: Obstetrics and Gynecology

## 2021-01-03 ENCOUNTER — Encounter: Payer: Self-pay | Admitting: Obstetrics and Gynecology

## 2021-01-03 ENCOUNTER — Other Ambulatory Visit: Payer: Self-pay

## 2021-01-03 VITALS — BP 127/87 | HR 82 | Ht 65.0 in | Wt 186.4 lb

## 2021-01-03 DIAGNOSIS — A599 Trichomoniasis, unspecified: Secondary | ICD-10-CM | POA: Diagnosis not present

## 2021-01-03 DIAGNOSIS — T8332XA Displacement of intrauterine contraceptive device, initial encounter: Secondary | ICD-10-CM

## 2021-01-03 DIAGNOSIS — Z975 Presence of (intrauterine) contraceptive device: Secondary | ICD-10-CM

## 2021-01-03 DIAGNOSIS — N921 Excessive and frequent menstruation with irregular cycle: Secondary | ICD-10-CM

## 2021-01-03 DIAGNOSIS — Z30433 Encounter for removal and reinsertion of intrauterine contraceptive device: Secondary | ICD-10-CM

## 2021-01-03 DIAGNOSIS — Z3202 Encounter for pregnancy test, result negative: Secondary | ICD-10-CM | POA: Diagnosis not present

## 2021-01-03 LAB — POCT PREGNANCY, URINE: Preg Test, Ur: NEGATIVE

## 2021-01-03 MED ORDER — LEVONORGESTREL 20 MCG/24HR IU IUD: 1.0000 | INTRAUTERINE_SYSTEM | Freq: Once | INTRAUTERINE | 0 refills | Status: DC

## 2021-01-03 MED ORDER — LEVONORGESTREL 20 MCG/24HR IU IUD: INTRAUTERINE_SYSTEM | Freq: Once | INTRAUTERINE | Status: AC

## 2021-01-03 NOTE — Progress Notes (Signed)
Obstetrics and Gynecology Visit Return Patient Evaluation  Appointment Date: 01/03/2021  OBGYN Clinic: Center for [facility]  Chief Complaint: AUB with IUD in place  History of Present Illness:  Janice Duarte is a 30 y.o. G1P1 (LMP: unknown) with above CC. Pmhx significant for HTN.  Patient had Liletta IUD placed in 08/2020 and then had a string check with trimming of the strings in January.  She states she had no issues with it until March when she had AUB that started shortly after sex and she's had waxing and waning AUB since then but no real discomfort. Last sex was greater than two weeks ago when the AUB started. Prior to the [place], she had a nexplanon but had AUB on it  Review of Systems:  as noted in the History of Present Illness. Medications: None Allergies: is allergic to penicillins, morphine and related, bactrim [sulfamethoxazole-trimethoprim], and elemental sulfur.  Physical Exam:  BP 127/87   Pulse 82   Ht 5\' 5"  (1.651 m)   Wt 186 lb 6.4 oz (84.6 kg)   BMI 31.02 kg/m  Body mass index is 31.02 kg/m. General appearance: Well nourished, well developed female in no acute distress.  Abdomen: diffusely non tender to palpation, non distended, and no masses, hernias Neuro/Psych:  Normal mood and affect.    Pelvic exam:  EGBUS normal Vagina normal Cervix normal but with white stem of IUD protruding out about 1cm. Strings grasped with forceps and easily removed, noted to be intact.   Options d/w pt and she desired another IUD. See insertion note but bedside u/s negative and Mirena IUD inserted w/o difficulty  Labs: UPT negative  Radiology: See above  Assessment: pt stable  Plan:  1. Breakthrough bleeding associated with intrauterine device (IUD) D/w pt to consider effective in one week - Cervicovaginal ancillary only( [place]) - levonorgestrel (MIRENA) 20 MCG/24HR IUD  2. Malpositioned intrauterine device (IUD), initial  encounter - Cervicovaginal ancillary only( [place])   RTC: 1 month for a string check  MD Attending Center for Cornelia Copa [facility])

## 2021-01-04 ENCOUNTER — Encounter: Payer: Self-pay | Admitting: Obstetrics and Gynecology

## 2021-01-04 HISTORY — PX: IUD INSERTION: OBO1003

## 2021-01-04 HISTORY — PX: IUD REMOVAL: OBO 1004

## 2021-01-04 LAB — CERVICOVAGINAL ANCILLARY ONLY
Bacterial Vaginitis (gardnerella): POSITIVE — AB
Chlamydia: NEGATIVE
Comment: NEGATIVE
Comment: NEGATIVE
Comment: NEGATIVE
Comment: NORMAL
Neisseria Gonorrhea: NEGATIVE
Trichomonas: POSITIVE — AB

## 2021-01-04 NOTE — Procedures (Signed)
Intrauterine Device (IUD) Removal and Insertion Procedure Note  Patient seen for AUB with Liletta IUD in place. Pregnancy test negative and last sex greater than two weeks ago; see progress note for full details.  On speculum exam, lower stem of IUD seen protruding past the external os. This was removed and noted to be intact. Options d/w patient she desired another IUD. Given Liletta being out of position, I told her I recommended a Mirena which she was amenable to.  Prior pregnancy u/s didn't note any fibroids. Bedside u/s done today and uterus looked normal sized, mid plane.     The patient understands the risks of IUD placement, which include but are not limited to: bleeding, infection, uterine perforation, risk of expulsion, risk of failure < 1%, increased risk of ectopic pregnancy in the event of failure. Prior to the procedure being performed, the patient (or guardian) was asked to state their full name, date of birth, and the type of procedure being performed. A bimanual exam showed the uterus to be midposition.  Next, the cervix and vagina were cleaned with an antiseptic solution, and the cervix was grasped with a tenaculum.  The uterus was sounded to 8 cm.  The Mirena was placed without difficulty in the usual fashion.  The strings were cut to 3-4 cm.  The tenaculum was removed and cervix was found to be hemostatic.    No complications, patient tolerated the procedure well.  Cornelia Copa MD Attending Center for [employer] (Faculty Practice) 01/03/2021

## 2021-01-10 DIAGNOSIS — A599 Trichomoniasis, unspecified: Secondary | ICD-10-CM | POA: Insufficient documentation

## 2021-01-10 MED ORDER — METRONIDAZOLE 500 MG PO TABS: 500.0000 mg | ORAL_TABLET | Freq: Two times a day (BID) | ORAL | 0 refills | Status: AC

## 2021-01-10 NOTE — Addendum Note (Signed)
Addended by: Blanchard Bing on: 01/10/2021 10:54 AM   Modules accepted: Orders

## 2021-01-16 ENCOUNTER — Ambulatory Visit: Payer: Medicaid Other | Admitting: Physical Therapy

## 2021-01-29 ENCOUNTER — Encounter: Payer: Self-pay | Admitting: *Deleted

## 2021-02-07 ENCOUNTER — Encounter: Payer: Self-pay | Admitting: Obstetrics and Gynecology

## 2021-02-07 ENCOUNTER — Other Ambulatory Visit: Payer: Self-pay

## 2021-02-07 ENCOUNTER — Other Ambulatory Visit (HOSPITAL_COMMUNITY)
Admission: RE | Admit: 2021-02-07 | Discharge: 2021-02-07 | Disposition: A | Discharge status: Home / Self Care | Payer: Medicaid Other | Source: Ambulatory Visit | Attending: Obstetrics and Gynecology | Admitting: Obstetrics and Gynecology

## 2021-02-07 ENCOUNTER — Ambulatory Visit (INDEPENDENT_AMBULATORY_CARE_PROVIDER_SITE_OTHER): Payer: Medicaid Other | Admitting: Obstetrics and Gynecology

## 2021-02-07 VITALS — BP 128/88 | HR 79 | Ht 65.0 in | Wt 178.1 lb

## 2021-02-07 DIAGNOSIS — N939 Abnormal uterine and vaginal bleeding, unspecified: Secondary | ICD-10-CM

## 2021-02-07 DIAGNOSIS — Z30431 Encounter for routine checking of intrauterine contraceptive device: Secondary | ICD-10-CM

## 2021-02-07 NOTE — Progress Notes (Signed)
Obstetrics and Gynecology Visit Return Patient Evaluation  Appointment Date: 02/07/2021  Primary Care Provider: Obgyn, [place] Clinic: Center for [facility]  Chief Complaint: routine IUD string check  History of Present Illness:  Janice Duarte is a 30 y.o. P1 s/p 4/7 removal of fallen Liletta IUD and replacement with new Mirena IUD. She was also trich+ at that visit  Interval History: Since that time, she states that she took the pills for the trich. She stated that the AUB resolved and then starting 4/16 she started having pink spotting qday. She confirms that she did take the flagyl  Review of Systems: as noted in the History of Present Illness.  Patient Active Problem List   Diagnosis Date Noted  . Trichomoniasis 01/10/2021   Medications:  [name] had no medications administered during this visit. Current Outpatient Medications  Medication Sig Dispense Refill  . levonorgestrel (MIRENA) 20 MCG/24HR IUD 1 each by Intrauterine route once.    . Blood Pressure Monitoring (BLOOD PRESSURE KIT) DEVI 1 Device by Does not apply route daily. ICD 10: Z34.00 1 each 0   No current facility-administered medications for this visit.    Allergies: is allergic to penicillins, morphine and related, bactrim [sulfamethoxazole-trimethoprim], and elemental sulfur.  Physical Exam:  BP 128/88   Pulse 79   Ht $R'5\' 5"'On$  (1.651 m)   Wt 178 lb 1.6 oz (80.8 kg)   BMI 29.64 kg/m  Body mass index is 29.64 kg/m. General appearance: Well nourished, well developed female in no acute distress.  Abdomen: diffusely non tender to palpation, non distended, and no masses, hernias Neuro/Psych:  Normal mood and affect.    Pelvic exam:  EGBUS, vaginal vault and cervix: within normal limits. Strings 3-4cm. No blood in vault   Assessment: pt stable  Plan:  1. Abnormal uterine bleeding (AUB) Pt wondering about a BTL. She states they wouldn't do one on her  before because of her age and she's only had one child. I told her about the permnancy of a BTL and it doesn't affect her periods at all; she's currently using the IUD for [facility] only.  I told her let's see if the swab is still + and then f/u with her to see if that's what she'd still like; BTL paper signed today in case that is what we're  - Cervicovaginal ancillary only( [place])  2. IUD check up   RTC: PRN  Durene Romans MD Attending Center for [employer] [facility])

## 2021-02-11 LAB — CERVICOVAGINAL ANCILLARY ONLY
Bacterial Vaginitis (gardnerella): POSITIVE — AB
Candida Glabrata: NEGATIVE
Candida Vaginitis: NEGATIVE
Chlamydia: NEGATIVE
Comment: NEGATIVE
Comment: NEGATIVE
Comment: NEGATIVE
Comment: NEGATIVE
Comment: NEGATIVE
Comment: NORMAL
Neisseria Gonorrhea: NEGATIVE
Trichomonas: NEGATIVE

## 2021-02-12 ENCOUNTER — Encounter: Payer: Self-pay | Admitting: *Deleted

## 2021-02-12 ENCOUNTER — Telehealth: Payer: Self-pay | Admitting: Lactation Services

## 2021-02-12 MED ORDER — METRONIDAZOLE 500 MG PO TABS: 500.0000 mg | ORAL_TABLET | Freq: Two times a day (BID) | ORAL | 0 refills | Status: DC

## 2021-02-12 NOTE — Telephone Encounter (Signed)
Patient called and LM for nurse to call back based on recent lab results.   Called patient back, she is + for BV. She is having vaginal discharge and is requesting treatment.   Advised taking with food and do not drink alcohol while taking. Patient voiced understanding.   Advised patient to call back if symptoms are not clearing after antibiotic therapy.

## 2021-03-04 ENCOUNTER — Other Ambulatory Visit (HOSPITAL_COMMUNITY)
Admission: RE | Admit: 2021-03-04 | Discharge: 2021-03-04 | Disposition: A | Discharge status: Home / Self Care | Payer: Medicaid Other | Source: Ambulatory Visit | Attending: Family Medicine | Admitting: Family Medicine

## 2021-03-04 ENCOUNTER — Ambulatory Visit (INDEPENDENT_AMBULATORY_CARE_PROVIDER_SITE_OTHER): Payer: Medicaid Other

## 2021-03-04 ENCOUNTER — Other Ambulatory Visit: Payer: Self-pay

## 2021-03-04 VITALS — BP 115/82 | HR 60 | Ht 65.5 in | Wt 178.3 lb

## 2021-03-04 DIAGNOSIS — N898 Other specified noninflammatory disorders of vagina: Secondary | ICD-10-CM | POA: Insufficient documentation

## 2021-03-04 DIAGNOSIS — Z5941 Food insecurity: Secondary | ICD-10-CM

## 2021-03-04 NOTE — Addendum Note (Signed)
Addended by: Isabell Jarvis on: 03/04/2021 03:58 PM   Modules accepted: Orders

## 2021-03-04 NOTE — Progress Notes (Addendum)
Pt here today for self swab due vaginal irritation with painful intercourse. Denies any vaginal discharge, odor or itching.  Pt has IUD in place. Denies any genital bumps and no hx of  HSV.  Pt states has hx of trich and BV. Pt states just finished Flagyl for BV on 02/12/21. Pt states same sexual partner.   Self swab collected today. Pt advised results will take 24-48 hours and will see results in mychart and will be notified if needs further treatment.   Pt also states wanting to have BTL consultation. Pt will make appt for follow up today at check out. Pt verbalized understanding.   Judeth Cornfield, RN

## 2021-03-05 LAB — CERVICOVAGINAL ANCILLARY ONLY
Bacterial Vaginitis (gardnerella): POSITIVE — AB
Candida Glabrata: NEGATIVE
Candida Vaginitis: POSITIVE — AB
Chlamydia: NEGATIVE
Comment: NEGATIVE
Comment: NEGATIVE
Comment: NEGATIVE
Comment: NEGATIVE
Comment: NEGATIVE
Comment: NORMAL
Neisseria Gonorrhea: NEGATIVE
Trichomonas: NEGATIVE

## 2021-03-05 NOTE — Progress Notes (Signed)
Patient was assessed and managed by nursing staff during this encounter. I have reviewed the chart and agree with the documentation and plan. I have also made any necessary editorial changes.  Warden Fillers, MD 03/05/2021 5:25 PM

## 2021-03-06 ENCOUNTER — Telehealth: Payer: Self-pay

## 2021-03-06 MED ORDER — METRONIDAZOLE 0.75 % VA GEL: 1.0000 | Freq: Every day | VAGINAL | 0 refills | Status: AC

## 2021-03-06 MED ORDER — FLUCONAZOLE 150 MG PO TABS: 150.0000 mg | ORAL_TABLET | Freq: Once | ORAL | 0 refills | Status: AC

## 2021-03-06 NOTE — Telephone Encounter (Signed)
-----   Message from Warden Fillers, MD sent at 03/05/2021  5:43 PM EDT ----- BV and yeast seen on swab, will offer treatment

## 2021-03-06 NOTE — Telephone Encounter (Signed)
Spoke with pt. Pt given results per Dr Donavan Foil. Pt verbalized understanding and agreeable to plan of care. Rx sent to pharmacy on file.   Rx Metrogel, at pt's request.  Rx Diflucan  Per Dr Shawnie Pons, ok to use boric acid vaginal suppositories twice weekly to restore pH balance and to use after intercourse, PRN. Pt verbalized understanding.    Judeth Cornfield, RN

## 2021-03-07 ENCOUNTER — Emergency Department (HOSPITAL_COMMUNITY)
Admission: EM | Admit: 2021-03-07 | Discharge: 2021-03-07 | Disposition: A | Discharge status: Home / Self Care | Payer: Medicaid Other | Attending: Emergency Medicine | Admitting: Emergency Medicine

## 2021-03-07 ENCOUNTER — Emergency Department (HOSPITAL_COMMUNITY): Payer: Medicaid Other

## 2021-03-07 ENCOUNTER — Encounter (HOSPITAL_COMMUNITY): Payer: Self-pay

## 2021-03-07 ENCOUNTER — Other Ambulatory Visit: Payer: Self-pay

## 2021-03-07 DIAGNOSIS — U071 COVID-19: Secondary | ICD-10-CM | POA: Diagnosis not present

## 2021-03-07 DIAGNOSIS — Z20822 Contact with and (suspected) exposure to covid-19: Secondary | ICD-10-CM

## 2021-03-07 DIAGNOSIS — M791 Myalgia, unspecified site: Secondary | ICD-10-CM | POA: Diagnosis present

## 2021-03-07 DIAGNOSIS — I10 Essential (primary) hypertension: Secondary | ICD-10-CM | POA: Insufficient documentation

## 2021-03-07 MED ORDER — ACETAMINOPHEN 500 MG PO TABS
1000.0000 mg | ORAL_TABLET | Freq: Once | ORAL | Status: AC
  Administered 2021-03-07: 1000 mg via ORAL
  Filled 2021-03-07: qty 2

## 2021-03-07 MED ORDER — IBUPROFEN 200 MG PO TABS
600.0000 mg | ORAL_TABLET | Freq: Once | ORAL | Status: AC
  Administered 2021-03-07: 600 mg via ORAL
  Filled 2021-03-07: qty 3

## 2021-03-07 NOTE — ED Triage Notes (Signed)
Pt presents from home, c/o body aches and chills starting today. Had home covid test that was positive. Last tylenol @ 130pm

## 2021-03-07 NOTE — ED Provider Notes (Signed)
Emergency Medicine Provider Triage Evaluation Note  Janice Duarte , a 30 y.o. female  was evaluated in triage.  Pt complains of body aches, vomiting, fever.  Review of Systems  Positive: Body aches, headache, fever Negative: vomiting  Physical Exam  BP (!) 151/103 (BP Location: Right Arm)   Pulse (!) 112   Temp (!) 101.3 F (38.5 C) (Oral)   Resp 16   Ht 5\' 5"  (1.651 m)   Wt 80.7 kg   LMP 02/22/2021 (Exact Date) Comment: pt states having irregular bleeding with IUD.   SpO2 100%   BMI 29.62 kg/m  Gen:   Awake, no distress   Resp:  Normal effort  MSK:   Moves extremities without difficulty   Medical Decision Making  Medically screening exam initiated at 10:10 PM.  Appropriate orders placed.  Janice Duarte was informed that the remainder of the evaluation will be completed by another provider, this initial triage assessment does not replace that evaluation, and the importance of remaining in the ED until their evaluation is complete.     Henriette Combs 03/07/21 2210    05/07/21, MD 03/12/21 2125

## 2021-03-07 NOTE — ED Provider Notes (Signed)
[facility]-EMERGENCY DEPT Provider Note   CSN: [id number] Arrival date & time: 03/07/21  1915     History Chief Complaint  Patient presents with   Generalized Body Aches    Janice Duarte is a 30 y.o. female.  The history is provided by the patient.  Janice Duarte is a 30 y.o. female who presents to the Emergency Department complaining of body aches.  She started feeling poorly today.  Tried to call out and was told she had to go to work.  She was later sent home from work.  She woke this body aches, chills.  Took a home covid test today and it was positive.  Has sore throat, cough (mild).  No abdominal pain.  Has nausea, no vomiting/diarrhea.  No dysuria.    Has been vaccinated for COVID 19.      Past Medical History:  Diagnosis Date   Abscess    Allergic rhinitis 12/28/2019   Alpha thalassemia silent carrier 02/08/2020   Bacterial vaginal infection    Headaches, cluster    Hidradenitis suppurativa    Breast, axillae, groin   Hypertension    Scoliosis    Sickle cell trait (HCC) 02/08/2020   Substance use 12/20/2019   + THC 12/20/2019    Patient Active Problem List   Diagnosis Date Noted   Trichomoniasis 01/10/2021    Past Surgical History:  Procedure Laterality Date   IUD INSERTION  01/04/2021       IUD REMOVAL  01/04/2021       WISDOM TOOTH EXTRACTION       OB History     Gravida  1   Para  1   Term  1   Preterm  0   AB  0   Living  1      SAB  0   IAB  0   Ectopic  0   Multiple  0   Live Births  1           Family History  Problem Relation Age of Onset   Diabetes Other    Hypertension Other    Hypertension Mother    Heart failure Mother    Hepatitis B Mother    Cirrhosis Mother    Hypertension Paternal Aunt    Hypertension Paternal Grandmother    Thyroid disease Paternal Grandmother     Social History   Tobacco Use   Smoking status: Never   Smokeless tobacco: Never  Vaping Use   Vaping Use:  Never used  Substance Use Topics   Alcohol use: Not Currently    Comment: socially   Drug use: Not Currently    Frequency: 7.0 times per week    Types: Marijuana    Comment: LAST SMOKED 12-29-19    Home Medications Prior to Admission medications   Medication Sig Start Date End Date Taking? Authorizing Provider  levonorgestrel (MIRENA) 20 MCG/24HR IUD 1 each by Intrauterine route once. 01/03/21   [provider]  metroNIDAZOLE (METROGEL VAGINAL) 0.75 % vaginal gel Place 1 Applicatorful vaginally at bedtime for 5 days. 03/06/21 03/11/21  Warden Fillers, MD  labetalol (NORMODYNE) 200 MG tablet Take 1 tablet (200 mg total) by mouth 2 (two) times daily. Patient not taking: No sig reported 02/29/20 09/13/20  Malachy Chamber, MD    Allergies    Penicillins, Morphine and related, Bactrim [sulfamethoxazole-trimethoprim], and Elemental sulfur  Review of Systems   Review of Systems  All other systems reviewed  and are negative.  Physical Exam Updated Vital Signs BP (!) 151/103 (BP Location: Right Arm)   Pulse (!) 112   Temp (!) 101.3 F (38.5 C) (Oral)   Resp 16   Ht 5\' 5"  (1.651 m)   Wt 80.7 kg   LMP 02/22/2021 (Exact Date) Comment: pt states having irregular bleeding with IUD.   SpO2 100%   BMI 29.62 kg/m   Physical Exam Vitals and nursing note reviewed.  Constitutional:      Appearance: She is well-developed.  HENT:     Head: Normocephalic and atraumatic.     Mouth/Throat:     Comments: Mild erythema in the posterior OP, no exudates Cardiovascular:     Rate and Rhythm: Normal rate and regular rhythm.     Heart sounds: No murmur heard. Pulmonary:     Effort: Pulmonary effort is normal. No respiratory distress.     Breath sounds: Normal breath sounds. No stridor.  Abdominal:     Palpations: Abdomen is soft.     Tenderness: There is no abdominal tenderness. There is no guarding or rebound.  Musculoskeletal:        General: No tenderness.  Skin:    General: Skin is  warm and dry.  Neurological:     Mental Status: She is alert and oriented to person, place, and time.  Psychiatric:        Behavior: Behavior normal.    ED Results / Procedures / Treatments   Labs (all labs ordered are listed, but only abnormal results are displayed) Labs Reviewed  SARS CORONAVIRUS 2 (TAT 6-24 HRS)    EKG None  Radiology DG Chest Portable 1 View  Result Date: 03/07/2021 CLINICAL DATA:  Body aches and chills. EXAM: PORTABLE CHEST 1 VIEW COMPARISON:  September 13, 2020 FINDINGS: The heart size and mediastinal contours are within normal limits. Both lungs are clear. Stable moderate severity dextroscoliosis of the mid to lower thoracic spine is seen. IMPRESSION: Stable exam without acute or active cardiopulmonary disease. Electronically Signed   By: September 15, 2020 M.D.   On: 03/07/2021 20:45    Procedures Procedures   Medications Ordered in ED Medications  ibuprofen (ADVIL) tablet 600 mg (600 mg Oral Given 03/07/21 2034)  acetaminophen (TYLENOL) tablet 1,000 mg (1,000 mg Oral Given 03/07/21 2034)    ED Course  I have reviewed the triage vital signs and the nursing notes.  Pertinent labs & imaging results that were available during my care of the patient were reviewed by me and considered in my medical decision making (see chart for details).    MDM Rules/Calculators/A&P                         patient here for evaluation of fever, body aches and malaise. She had a positive COVID-19 test at home. Her place of employment will not take a home test and she was told to get a confirmatory test. On examination she is non-toxic appearing, well hydrated with no respiratory distress. COVID-19 test is pending at this time. Discussed with patient home care for COVID-19. Discussed outpatient follow-up and return precautions.  presentation is not consistent with sepsis, serious bacterial infection. Final Clinical Impression(s) / ED Diagnoses Final diagnoses:  Suspected  COVID-19 virus infection    Rx / DC Orders ED Discharge Orders     None        2035, MD 03/07/21 2304

## 2021-03-08 LAB — SARS CORONAVIRUS 2 (TAT 6-24 HRS): SARS Coronavirus 2: POSITIVE — AB

## 2021-04-11 ENCOUNTER — Encounter: Payer: Self-pay | Admitting: Obstetrics and Gynecology

## 2021-04-11 ENCOUNTER — Ambulatory Visit: Payer: Medicaid Other | Admitting: Obstetrics and Gynecology

## 2021-04-11 NOTE — Progress Notes (Signed)
Patient did not keep her BTL consult appointment for 04/11/2021.  Cornelia Copa MD Attending [facility] [employer] [facility])

## 2021-06-12 ENCOUNTER — Ambulatory Visit: Payer: Medicaid Other | Admitting: Obstetrics and Gynecology

## 2021-06-19 ENCOUNTER — Encounter (HOSPITAL_COMMUNITY): Payer: Self-pay

## 2021-06-19 ENCOUNTER — Other Ambulatory Visit: Payer: Self-pay

## 2021-06-19 ENCOUNTER — Emergency Department (HOSPITAL_COMMUNITY)
Admission: EM | Admit: 2021-06-19 | Discharge: 2021-06-19 | Disposition: A | Discharge status: Home / Self Care | Payer: Medicaid Other | Attending: Emergency Medicine | Admitting: Emergency Medicine

## 2021-06-19 ENCOUNTER — Emergency Department (HOSPITAL_COMMUNITY): Payer: Medicaid Other

## 2021-06-19 DIAGNOSIS — M25561 Pain in right knee: Secondary | ICD-10-CM

## 2021-06-19 DIAGNOSIS — Z79899 Other long term (current) drug therapy: Secondary | ICD-10-CM | POA: Insufficient documentation

## 2021-06-19 DIAGNOSIS — X509XXA Other and unspecified overexertion or strenuous movements or postures, initial encounter: Secondary | ICD-10-CM | POA: Insufficient documentation

## 2021-06-19 DIAGNOSIS — I10 Essential (primary) hypertension: Secondary | ICD-10-CM | POA: Diagnosis not present

## 2021-06-19 DIAGNOSIS — Y990 Civilian activity done for income or pay: Secondary | ICD-10-CM | POA: Diagnosis not present

## 2021-06-19 DIAGNOSIS — Y9389 Activity, other specified: Secondary | ICD-10-CM | POA: Insufficient documentation

## 2021-06-19 DIAGNOSIS — Y9289 Other specified places as the place of occurrence of the external cause: Secondary | ICD-10-CM | POA: Insufficient documentation

## 2021-06-19 NOTE — ED Provider Notes (Signed)
[facility]   CSN: [id number] Arrival date & time: 06/19/21  1523     History Chief Complaint  Patient presents with   Knee Pain    Janice Duarte is a 30 y.o. female.  HPI Patient is a 30 year old female who presents to the emergency department due to right knee pain.  Patient states she was at work and pivoted on the right knee and the joint buckled and she then felt a pop in the joint.  Reports associated pain as well as difficulty with range of motion.  States her pain worsens with ambulation.  No numbness or weakness.    Past Medical History:  Diagnosis Date   Abscess    Allergic rhinitis 12/28/2019   Alpha thalassemia silent carrier 02/08/2020   Bacterial vaginal infection    Headaches, cluster    Hidradenitis suppurativa    Breast, axillae, groin   Hypertension    Scoliosis    Sickle cell trait (HCC) 02/08/2020   Substance use 12/20/2019   + THC 12/20/2019    Patient Active Problem List   Diagnosis Date Noted   Trichomoniasis 01/10/2021    Past Surgical History:  Procedure Laterality Date   IUD INSERTION  01/04/2021       IUD REMOVAL  01/04/2021       WISDOM TOOTH EXTRACTION       OB History     Gravida  1   Para  1   Term  1   Preterm  0   AB  0   Living  1      SAB  0   IAB  0   Ectopic  0   Multiple  0   Live Births  1           Family History  Problem Relation Age of Onset   Diabetes Other    Hypertension Other    Hypertension Mother    Heart failure Mother    Hepatitis B Mother    Cirrhosis Mother    Hypertension Paternal Aunt    Hypertension Paternal Grandmother    Thyroid disease Paternal Grandmother     Social History   Tobacco Use   Smoking status: Never   Smokeless tobacco: Never  Vaping Use   Vaping Use: Never used  Substance Use Topics   Alcohol use: Not Currently    Comment: socially   Drug use: Not Currently    Frequency: 7.0 times per week     Types: Marijuana    Comment: LAST SMOKED 12-29-19    Home Medications Prior to Admission medications   Medication Sig Start Date End Date Taking? Authorizing Provider  levonorgestrel (MIRENA) 20 MCG/24HR IUD 1 each by Intrauterine route once. 01/03/21   [provider]  labetalol (NORMODYNE) 200 MG tablet Take 1 tablet (200 mg total) by mouth 2 (two) times daily. Patient not taking: No sig reported 02/29/20 09/13/20  Malachy Chamber, MD    Allergies    Penicillins, Morphine and related, Bactrim [sulfamethoxazole-trimethoprim], and Elemental sulfur  Review of Systems   Review of Systems  Musculoskeletal:  Positive for arthralgias and myalgias.  Skin:  Negative for color change and wound.  Neurological:  Negative for weakness.   Physical Exam Updated Vital Signs BP 126/89   Pulse 86   Temp 98.5 F (36.9 C)   Resp 18   Ht 5\' 5"  (1.651 m)   Wt 80 kg   SpO2 100%  BMI 29.35 kg/m   Physical Exam Vitals and nursing note reviewed.  Constitutional:      General: She is not in acute distress.    Appearance: She is well-developed.  HENT:     Head: Normocephalic and atraumatic.     Right Ear: External ear normal.     Left Ear: External ear normal.  Eyes:     General: No scleral icterus.       Right eye: No discharge.        Left eye: No discharge.     Conjunctiva/sclera: Conjunctivae normal.  Neck:     Trachea: No tracheal deviation.  Cardiovascular:     Rate and Rhythm: Normal rate.  Pulmonary:     Effort: Pulmonary effort is normal. No respiratory distress.     Breath sounds: No stridor.  Abdominal:     General: There is no distension.  Musculoskeletal:        General: Tenderness present. No swelling or deformity.     Cervical back: Neck supple.     Comments: Moderate TTP noted along the medial and lateral joint line of the right knee.  Pain appears to be worst along the medial joint line.  Unable to assess range of motion as well as laxity in the joint due to  patient's pain.  No increased warmth in the joint.  No palpable effusion noted.  2+ DP pulses.  Distal sensation intact.  Skin:    General: Skin is warm and dry.     Findings: No rash.  Neurological:     Mental Status: She is alert.     Cranial Nerves: Cranial nerve deficit: no gross deficits.    ED Results / Procedures / Treatments   Labs (all labs ordered are listed, but only abnormal results are displayed) Labs Reviewed - No data to display  EKG None  Radiology No results found.  Procedures Procedures   Medications Ordered in ED Medications - No data to display  ED Course  I have reviewed the triage vital signs and the nursing notes.  Pertinent labs & imaging results that were available during my care of the patient were reviewed by me and considered in my medical decision making (see chart for details).    MDM Rules/Calculators/A&P                          Patient is a 30 year old female who presents to the emergency department due to right knee pain that occurred while at work earlier today.  X-rays obtained in triage with findings as noted below:  IMPRESSION: 1. No acute osseous abnormality. 2. Small joint effusion.  Patient neurovascularly intact in the right leg distal to the injury.  No increased warmth.  Patient afebrile and not tachycardic.  Doubt septic joint.  She does have tenderness along both the medial and lateral joint line but pain appears to be worst along the medial joint line.  Possible meniscal injury.  Unable to assess range of motion as well as laxity due to patient's pain.  Will place patient in a knee sleeve for comfort.  Recommended Tylenol/ibuprofen for management of her symptoms.  RICE method.  We will give her a referral to orthopedics if she finds that her symptoms are refractory.  Her questions were answered and she was amicable at the time of discharge.  Final Clinical Impression(s) / ED Diagnoses Final diagnoses:  Acute pain of right  knee    Rx /  DC Orders ED Discharge Orders     None        Placido Sou, Cordelia Poche 06/19/21 1838    Tegeler, Canary Brim, MD 06/19/21 2350

## 2021-06-19 NOTE — ED Provider Notes (Signed)
Emergency Medicine Provider Triage Evaluation Note  Janice Duarte , a 30 y.o. female  was evaluated in triage.  Pt complains of right knee pain.  Patient states that she was at work and pivoted on the right knee and the joint buckled and she felt a pop.  Reports associated pain in the region as well as difficulty with range of motion due to her pain.  No numbness or weakness.  Physical Exam  BP 126/89   Pulse 86   Temp 98.5 F (36.9 C)   Resp 18   SpO2 100%  Gen:   Awake, no distress   Resp:  Normal effort  MSK:   Moves extremities without difficulty  Other:  Moderate tenderness noted along the medial and lateral joint lines of the right knee.  Pain appears to be worst along the medial joint line.  2+ pedal pulses.  Distal sensation intact.  Medical Decision Making  Medically screening exam initiated at 3:42 PM.  Appropriate orders placed.  Janice Duarte was informed that the remainder of the evaluation will be completed by another provider, this initial triage assessment does not replace that evaluation, and the importance of remaining in the ED until their evaluation is complete.   Placido Sou, PA-C 06/19/21 1543    Franne Forts, DO 06/20/21 [phone]

## 2021-06-19 NOTE — ED Triage Notes (Signed)
Right knee pain after turning to fast and states it popped out of place and she popped it back in

## 2021-06-19 NOTE — Discharge Instructions (Addendum)
I recommend a combination of tylenol and ibuprofen for management of your pain. You can take a low dose of both at the same time. I recommend 500 mg of Tylenol combined with 600 mg of ibuprofen. This is one maximum strength Tylenol and three regular ibuprofen. You can take these 2-3 times for day for your pain. Please try to take these medications with a small amount of food as well to prevent upsetting your stomach.  Also, please consider topical pain relieving creams such as Voltaran Gel, BioFreeze, or Icy Hot. There is also a pain relieving cream made by [employer]. You should be able to find all of these at your local pharmacy.   Please adhere to the "RICE Method" for rehabbing. This stands for "rest, ice, compress, and elevate". Please continue to perform stretches and exercise as much as your pain allows. It is important that you keep moving to help heal your injury.   Below the contact information for Dr. Ophelia Charter.  He is a Investment banker, corporate.  Please give him a call in 1 week if your symptoms have not improved.  It was a pleasure to meet you.

## 2021-07-23 ENCOUNTER — Other Ambulatory Visit: Payer: Self-pay | Admitting: Orthopedic Surgery

## 2021-07-23 DIAGNOSIS — G8929 Other chronic pain: Secondary | ICD-10-CM

## 2021-07-23 DIAGNOSIS — S83411A Sprain of medial collateral ligament of right knee, initial encounter: Secondary | ICD-10-CM

## 2021-07-23 DIAGNOSIS — M2351 Chronic instability of knee, right knee: Secondary | ICD-10-CM

## 2021-07-29 ENCOUNTER — Ambulatory Visit
Admission: RE | Admit: 2021-07-29 | Discharge: 2021-07-29 | Disposition: A | Discharge status: Home / Self Care | Payer: Medicaid Other | Source: Ambulatory Visit | Attending: Orthopedic Surgery | Admitting: Orthopedic Surgery

## 2021-07-29 ENCOUNTER — Other Ambulatory Visit: Payer: Self-pay

## 2021-07-29 DIAGNOSIS — S83411A Sprain of medial collateral ligament of right knee, initial encounter: Secondary | ICD-10-CM

## 2021-07-29 DIAGNOSIS — G8929 Other chronic pain: Secondary | ICD-10-CM

## 2021-07-29 DIAGNOSIS — M2351 Chronic instability of knee, right knee: Secondary | ICD-10-CM

## 2021-11-30 IMAGING — DX DG CHEST 1V PORT
1 series · 1 of 1 positions shown · non-contrast
Comparison: September 13, 2020

CLINICAL DATA: Body aches and chills.

EXAM:
PORTABLE CHEST 1 VIEW

[chest ap]
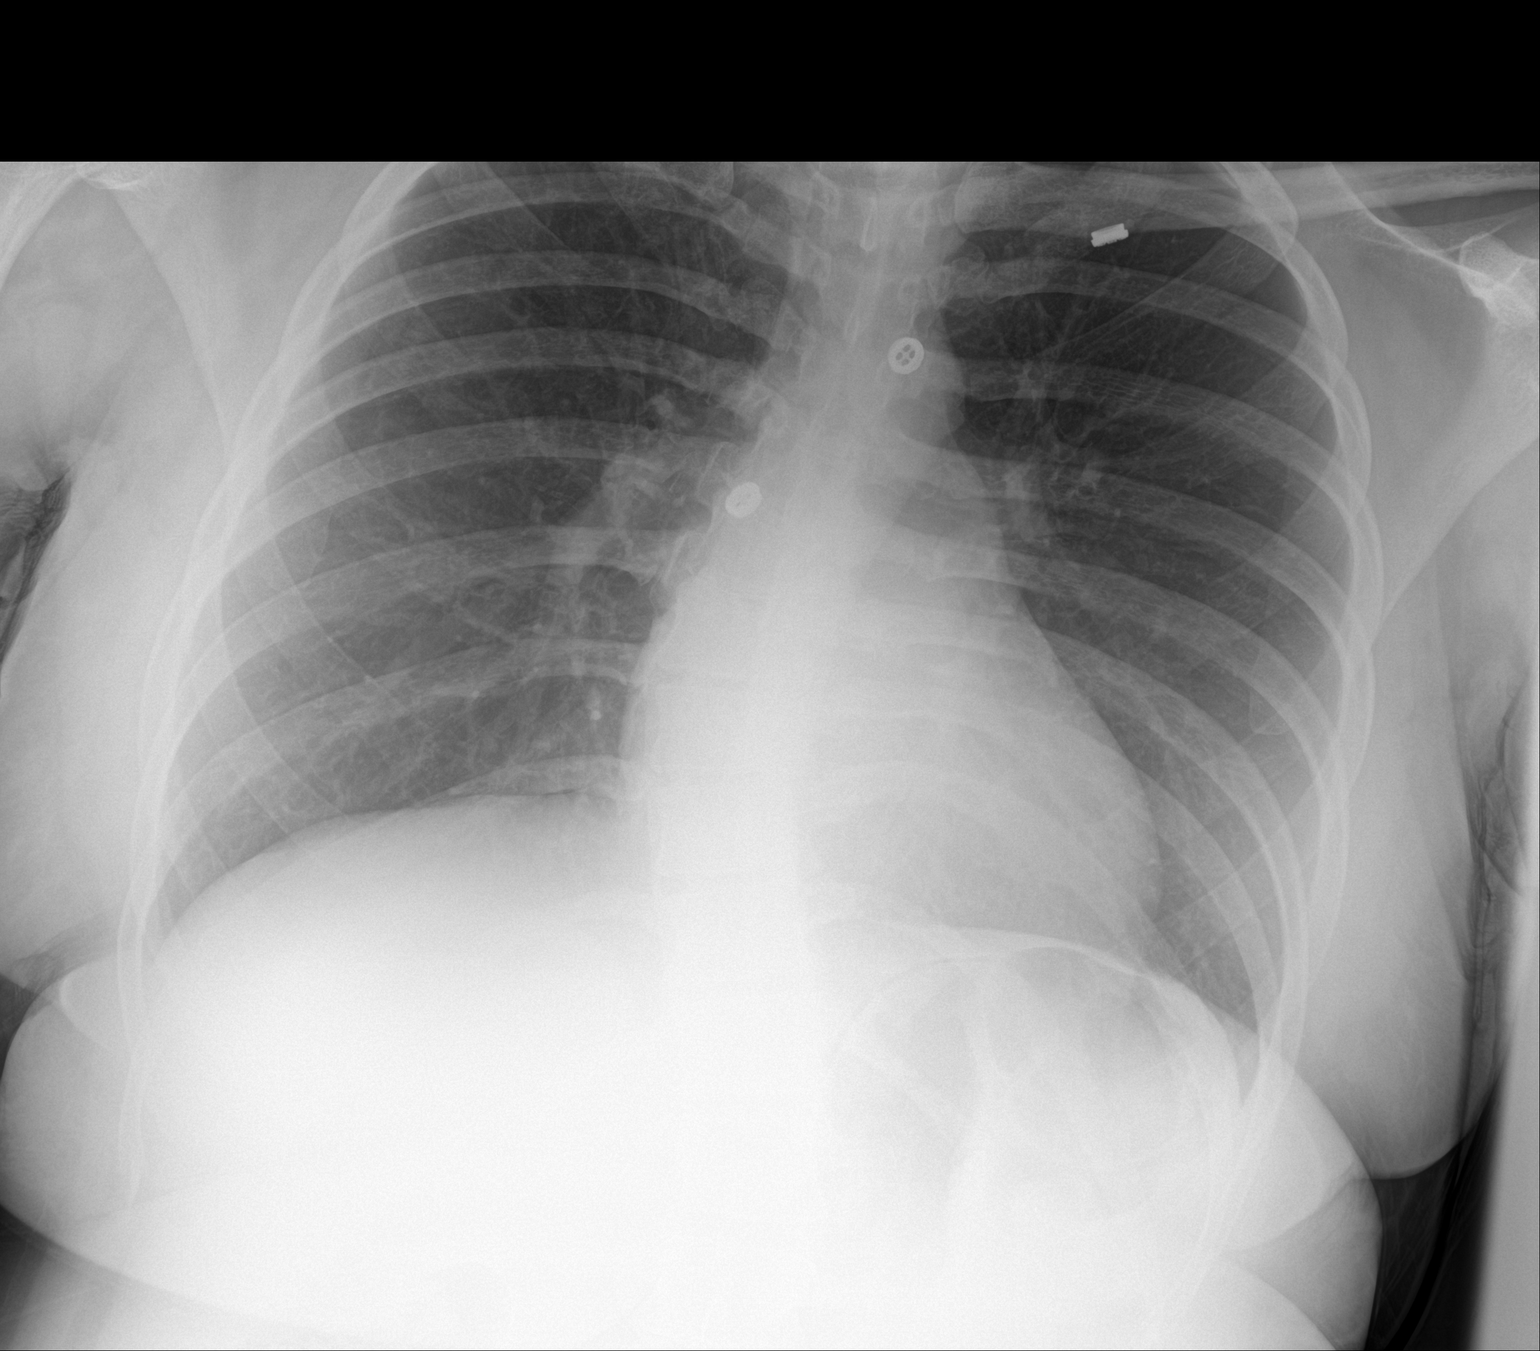

[1 of 1 positions shown; findings below may reference images not displayed]

FINDINGS: The heart size and mediastinal contours are within normal limits.
Both lungs are clear. Stable moderate severity dextroscoliosis of
the mid to lower thoracic spine is seen.
IMPRESSION: Stable exam without acute or active cardiopulmonary disease.

## 2022-01-19 ENCOUNTER — Emergency Department (HOSPITAL_COMMUNITY)
Admission: EM | Admit: 2022-01-19 | Discharge: 2022-01-19 | Disposition: A | Discharge status: Home / Self Care | Payer: Medicaid Other | Attending: Emergency Medicine | Admitting: Emergency Medicine

## 2022-01-19 ENCOUNTER — Emergency Department (HOSPITAL_COMMUNITY): Payer: Medicaid Other

## 2022-01-19 ENCOUNTER — Other Ambulatory Visit: Payer: Self-pay

## 2022-01-19 ENCOUNTER — Encounter (HOSPITAL_COMMUNITY): Payer: Self-pay

## 2022-01-19 DIAGNOSIS — Z20822 Contact with and (suspected) exposure to covid-19: Secondary | ICD-10-CM | POA: Insufficient documentation

## 2022-01-19 DIAGNOSIS — Z79899 Other long term (current) drug therapy: Secondary | ICD-10-CM | POA: Insufficient documentation

## 2022-01-19 DIAGNOSIS — J029 Acute pharyngitis, unspecified: Secondary | ICD-10-CM | POA: Diagnosis present

## 2022-01-19 DIAGNOSIS — J069 Acute upper respiratory infection, unspecified: Secondary | ICD-10-CM | POA: Diagnosis not present

## 2022-01-19 DIAGNOSIS — I10 Essential (primary) hypertension: Secondary | ICD-10-CM | POA: Diagnosis not present

## 2022-01-19 LAB — RESP PANEL BY RT-PCR (FLU A&B, COVID) ARPGX2
Influenza A by PCR: NEGATIVE
Influenza B by PCR: NEGATIVE
SARS Coronavirus 2 by RT PCR: NEGATIVE

## 2022-01-19 LAB — GROUP A STREP BY PCR: Group A Strep by PCR: NOT DETECTED

## 2022-01-19 MED ORDER — IBUPROFEN 400 MG PO TABS
600.0000 mg | ORAL_TABLET | Freq: Once | ORAL | Status: AC
  Administered 2022-01-19: 600 mg via ORAL
  Filled 2022-01-19: qty 1

## 2022-01-19 MED ORDER — ALBUTEROL SULFATE HFA 108 (90 BASE) MCG/ACT IN AERS
1.0000 | INHALATION_SPRAY | Freq: Four times a day (QID) | RESPIRATORY_TRACT | 0 refills | Status: DC | PRN
Start: 2022-01-19 — End: 2022-02-18

## 2022-01-19 NOTE — ED Provider Notes (Addendum)
?[facility] EMERGENCY DEPARTMENT ?Provider Note ? ? ?CSN: [id number] ?Arrival date & time: 01/19/22  1009 ? ?  ? ?History ? ?Chief Complaint  ?Patient presents with  ? Sore Throat  ? ? ?Janice Duarte is a 31 y.o. female with medical history significant for sickle cell trait, cluster headaches, hypertension, hidradenitis suppurativa.  The patient presents to ED for evaluation of sore throat.  Patient reports that beginning Friday she had sore throat, body aches and chills, cough, shortness of breath.  Patient also complains of chest pain that is reproducible on palpation.  Patient states that since Friday the sore throat and cough have gradually worsened.  The patient states that she had some transient relief with Tylenol however her sore throat always returns.  Patient states that her son at home currently has an ear infection.  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  Patient reports had a similar occurrence of the same symptoms this time last year and was treated with albuterol inhaler.  Patient denies any fevers, nausea, vomiting, diarrhea, abdominal pain, trouble swallowing. ? ? ?Sore Throat ?Associated symptoms include shortness of breath. Pertinent negatives include no chest pain and no abdominal pain.  ? ?  ? ?Home Medications ?Prior to Admission medications   ?Medication Sig Start Date End Date Taking? Authorizing Provider  ?albuterol (VENTOLIN HFA) 108 (90 Base) MCG/ACT inhaler Inhale 1-2 puffs into the lungs every 6 (six) hours as needed for wheezing or shortness of breath. 01/19/22  Yes Al Decant, PA-C  ?levonorgestrel (MIRENA) 20 MCG/24HR IUD 1 each by Intrauterine route once. 01/03/21   [provider]  ?labetalol (NORMODYNE) 200 MG tablet Take 1 tablet (200 mg total) by mouth 2 (two) times daily. ?Patient not taking: No sig reported 02/29/20 09/13/20  Malachy Chamber, MD  ?   ? ?Allergies     ?Penicillins, Morphine and related, Bactrim [sulfamethoxazole-trimethoprim], and Elemental sulfur   ? ?Review of Systems   ?Review of Systems  ?Constitutional:  Positive for chills. Negative for fever.  ?HENT:  Positive for sore throat. Negative for trouble swallowing.   ?Respiratory:  Positive for cough, chest tightness and shortness of breath.   ?Cardiovascular:  Negative for chest pain.  ?Gastrointestinal:  Negative for abdominal pain, diarrhea, nausea and vomiting.  ?All other systems reviewed and are negative. ? ?Physical Exam ?Updated Vital Signs ?BP (!) 131/96   Pulse 73   Temp 98.2 ?F (36.8 ?C) (Oral)   Resp 14   SpO2 100%  ?Physical Exam ?Vitals and nursing note reviewed.  ?Constitutional:   ?   General: She is not in acute distress. ?   Appearance: She is well-developed. She is not ill-appearing, toxic-appearing or diaphoretic.  ?HENT:  ?   Head: Normocephalic and atraumatic.  ?   Nose: No congestion.  ?   Mouth/Throat:  ?   Mouth: Mucous membranes are moist.  ?   Pharynx: Uvula midline. Posterior oropharyngeal erythema present. No oropharyngeal exudate or uvula swelling.  ?   Tonsils: No tonsillar exudate or tonsillar abscesses. 0 on the right. 0 on the left.  ?Eyes:  ?   Conjunctiva/sclera: Conjunctivae normal.  ?   Pupils: Pupils are equal, round, and reactive to light.  ?Cardiovascular:  ?   Rate and Rhythm: Normal rate and regular rhythm.  ?Pulmonary:  ?   Effort: Pulmonary effort is normal.  ?   Breath sounds: Normal breath  sounds. No wheezing.  ?Chest:  ?   Chest wall: Tenderness present.  ?Abdominal:  ?   General: Bowel sounds are normal.  ?   Palpations: Abdomen is soft.  ?   Tenderness: There is no abdominal tenderness.  ?Musculoskeletal:  ?   Cervical back: Normal range of motion and neck supple.  ?Lymphadenopathy:  ?   Cervical: Cervical adenopathy present.  ?Skin: ?   General: Skin is warm and dry.  ?   Capillary Refill: Capillary refill takes less than 2 seconds.  ?Neurological:  ?    Mental Status: She is alert and oriented to person, place, and time.  ? ? ?ED Results / Procedures / Treatments   ?Labs ?(all labs ordered are listed, but only abnormal results are displayed) ?Labs Reviewed  ?RESP PANEL BY RT-PCR (FLU A&B, COVID) ARPGX2  ?GROUP A STREP BY PCR  ? ? ?EKG ?None ? ?Radiology ?DG Chest 2 View ? ?Result Date: 01/19/2022 ?CLINICAL DATA:  Acute shortness of breath, sore throat and congestion these for several days. EXAM: CHEST - 2 VIEW COMPARISON:  03/07/2021 and prior studies FINDINGS: The cardiomediastinal silhouette is unremarkable. There is no evidence of focal airspace disease, pulmonary edema, suspicious pulmonary nodule/mass, pleural effusion, or pneumothorax. No acute bony abnormalities are identified. A moderate apex RIGHT LOWER thoracic scoliosis again noted. IMPRESSION: No active cardiopulmonary disease. Electronically Signed   By: Harmon Pier M.D.   On: 01/19/2022 11:07   ? ?Procedures ?Procedures  ? ? ?Medications Ordered in ED ?Medications  ?ibuprofen (ADVIL) tablet 600 mg (600 mg Oral Given 01/19/22 1127)  ? ? ?ED Course/ Medical Decision Making/ A&P ?  ?                        ?Medical Decision Making ?Amount and/or Complexity of Data Reviewed ?Radiology: ordered. ? ?Risk ?Prescription drug management. ? ? ?31 year old female presents to ED for evaluation of sore throat.  Please see HPI for further details. ? ?On examination, the patient is afebrile, nontachycardic.  The patient is nonhypoxic with 100% oxygen saturation on room air.  Her lung sounds are clear bilaterally.  The patient's abdomen is soft compressible all 4 quadrants.  Patient oropharynx inspected, no sign of uvular deviation, RPA, PTA.  The patient is handling secretions appropriately.  The patient's airway is intact. ? ?Patient worked up utilizing the following labs and imaging studies interpreted by me personally: ?- Strep negative ?- Respiratory panel negative for influenza, coronavirus ?- Chest x-ray shows  no signs of cardiomegaly, consolidation, effusion, mediastinal widening ? ?Patient treated with 600 mg ibuprofen, states that her sore throat feels better ? ?At this time, I feel that this patient is stable for discharge.  The patient will be encouraged to follow-up with her PCP.  I will send this patient in an albuterol inhaler.  The patient was given return precautions and she voiced understanding of these.  The patient had all of her questions answered to her satisfaction.  The patient is stable for discharge at this time ? ? ?Final Clinical Impression(s) / ED Diagnoses ?Final diagnoses:  ?Sore throat  ?Viral URI with cough  ? ? ?Rx / DC Orders ?ED Discharge Orders   ? ?      Ordered  ?  albuterol (VENTOLIN HFA) 108 (90 Base) MCG/ACT inhaler  Every 6 hours PRN       ? 01/19/22 1157  ? ?  ?  ? ?  ? ? ?  ? ? ?  ?  Al DecantGroce, Kalai Baca F, PA-C ?01/19/22 1208 ? ?  ?Terald Sleeperrifan, Matthew J, MD ?01/19/22 1420 ? ?

## 2022-01-19 NOTE — ED Triage Notes (Signed)
Pt here POV with complaints of a sore throat, runny nose, and shob since Friday. Pt states she has a history of the same around the same time last year and was given an inhaler.  ?

## 2022-01-19 NOTE — Discharge Instructions (Signed)
Please return to the ED with any new symptoms such as trouble swallowing, drooling, chest pain or shortness of breath ?Please follow-up with the PCP as we discussed ?Please pick up your albuterol inhaler ?Please read the attached informational guide concerning upper respiratory infections as well as sore throats ?

## 2022-02-10 ENCOUNTER — Ambulatory Visit: Payer: Self-pay | Admitting: *Deleted

## 2022-02-10 NOTE — Telephone Encounter (Signed)
?  Chief Complaint: Called in on [facility] line.   Stomach pain and menstrual like cramping since starting prednisone for a swollen knee. ?Symptoms: stomach pain.  Has murina in and is having menstrual like cramps too ?Frequency: since prednisone ?Pertinent Negatives: Patient denies vaginal bleeding ?Disposition: [] ED /[] Urgent Care (no appt availability in office) / [] Appointment(In office/virtual)/ []  [facility]/ [] Home Care/ [] Refused Recommended Disposition /[] [place]/ [x]  Follow-up with PCP ?Additional Notes: I instructed pt to contact the dr. Who prescribed the prednisone for the swollen knee and let him know she is having stomach pain from the prednisone.   Pt was agreeable to doing that.   Also if vaginal bleeding occurs to contact her OB-GYN that put in the [place] at the [facility].   She verbalized understanding.  ?

## 2022-02-10 NOTE — Telephone Encounter (Signed)
Reason for Disposition ? [1] MODERATE pain (e.g., interferes with normal activities) AND [2] pain comes and goes (cramps) AND [3] present > 24 hours  (Exception: pain with Vomiting or Diarrhea - see that Guideline) ?   Since starting prednisone for swollen knee. ? ?Answer Assessment - Initial Assessment Questions ?1. LOCATION: "Where does it hurt?"  ?    I'm on prednisone and now I'm having menstrual cramps.   I'm having stomach pain since starting the prednisone.    Murina put in by a dr. On [address]    [facility] [place]. ?I had knee surgery in Nov.  And it's swollen so I was put on prednisone for the swelling in my knee.   That's when the stomach pain started was after starting the prednisone so I wasn't sure what to do. ?2. RADIATION: "Does the pain shoot anywhere else?" (e.g., chest, back) ?    It feels like menstrual cramps.   No vaginal bleeding ?3. ONSET: "When did the pain begin?" (e.g., minutes, hours or days ago)  ?    Right after starting the prednisone ?4. SUDDEN: "Gradual or sudden onset?" ?    *No Answer* ?5. PATTERN "Does the pain come and go, or is it constant?" ?   - If constant: "Is it getting better, staying the same, or worsening?"  ?    (Note: Constant means the pain never goes away completely; most serious pain is constant and it progresses)  ?   - If intermittent: "How long does it last?" "Do you have pain now?" ?    (Note: Intermittent means the pain goes away completely between bouts) ?    *No Answer* ?6. SEVERITY: "How bad is the pain?"  (e.g., Scale 1-10; mild, moderate, or severe) ?  - MILD (1-3): doesn't interfere with normal activities, abdomen soft and not tender to touch  ?  - MODERATE (4-7): interferes with normal activities or awakens from sleep, abdomen tender to touch  ?  - SEVERE (8-10): excruciating pain, doubled over, unable to do any normal activities  ?    *No Answer* ?7. RECURRENT SYMPTOM: "Have you ever had this type of stomach pain before?" If Yes,  ask: "When was the last time?" and "What happened that time?"  ?    *No Answer* ?8. CAUSE: "What do you think is causing the stomach pain?" ?    The prednisone since it started after starting it. ?9. RELIEVING/AGGRAVATING FACTORS: "What makes it better or worse?" (e.g., movement, antacids, bowel movement) ?    *No Answer* ?10. OTHER SYMPTOMS: "Do you have any other symptoms?" (e.g., back pain, diarrhea, fever, urination pain, vomiting) ?      *No Answer* ?11. PREGNANCY: "Is there any chance you are pregnant?" "When was your last menstrual period?" ?      *No Answer* ? ?Protocols used: Abdominal Pain - Female-A-AH ? ?

## 2022-02-17 ENCOUNTER — Other Ambulatory Visit: Payer: Self-pay | Admitting: Orthopedic Surgery

## 2022-02-17 DIAGNOSIS — Z9889 Other specified postprocedural states: Secondary | ICD-10-CM

## 2022-02-18 ENCOUNTER — Other Ambulatory Visit: Payer: Self-pay

## 2022-02-18 ENCOUNTER — Emergency Department (HOSPITAL_COMMUNITY)
Admission: EM | Admit: 2022-02-18 | Discharge: 2022-02-18 | Disposition: A | Discharge status: Home / Self Care | Payer: Medicaid Other | Attending: Emergency Medicine | Admitting: Emergency Medicine

## 2022-02-18 ENCOUNTER — Encounter (HOSPITAL_COMMUNITY): Payer: Self-pay | Admitting: Pharmacy Technician

## 2022-02-18 ENCOUNTER — Ambulatory Visit
Admission: RE | Admit: 2022-02-18 | Discharge: 2022-02-18 | Disposition: A | Discharge status: Home / Self Care | Payer: Medicaid Other | Source: Ambulatory Visit | Attending: Orthopedic Surgery | Admitting: Orthopedic Surgery

## 2022-02-18 DIAGNOSIS — L539 Erythematous condition, unspecified: Secondary | ICD-10-CM | POA: Diagnosis not present

## 2022-02-18 DIAGNOSIS — U071 COVID-19: Secondary | ICD-10-CM | POA: Insufficient documentation

## 2022-02-18 DIAGNOSIS — M791 Myalgia, unspecified site: Secondary | ICD-10-CM | POA: Diagnosis present

## 2022-02-18 DIAGNOSIS — Z9889 Other specified postprocedural states: Secondary | ICD-10-CM

## 2022-02-18 LAB — RESP PANEL BY RT-PCR (FLU A&B, COVID) ARPGX2
Influenza A by PCR: NEGATIVE
Influenza B by PCR: NEGATIVE
SARS Coronavirus 2 by RT PCR: POSITIVE — AB

## 2022-02-18 MED ORDER — ALBUTEROL SULFATE HFA 108 (90 BASE) MCG/ACT IN AERS: 1.0000 | INHALATION_SPRAY | Freq: Four times a day (QID) | RESPIRATORY_TRACT | 0 refills | Status: AC | PRN

## 2022-02-18 MED ORDER — BENZONATATE 200 MG PO CAPS: 200.0000 mg | ORAL_CAPSULE | Freq: Three times a day (TID) | ORAL | 0 refills | Status: AC

## 2022-02-18 NOTE — ED Provider Notes (Signed)
[facility]-EMERGENCY DEPT Provider Note   CSN: [id number] Arrival date & time: 02/18/22  1820     History  Chief Complaint  Patient presents with   Chills   Generalized Body Aches    Janice Duarte is a 31 y.o. female.  31 year old female presents with complaint of cough, congestion, fevers, chills and body aches.  Patient states he had a slight scratchy throat yesterday that was not significant until she woke up with remainder of her symptoms today.  Patient took a home COVID test which is positive.  She denies any immune compromising conditions, smokes Black and milds on occasion otherwise denies any history of chronic lung disease.  Patient is here today requesting COVID test for purposes of employment.      Home Medications Prior to Admission medications   Medication Sig Start Date End Date Taking? Authorizing Provider  albuterol (VENTOLIN HFA) 108 (90 Base) MCG/ACT inhaler Inhale 1-2 puffs into the lungs every 6 (six) hours as needed for wheezing or shortness of breath. 02/18/22  Yes Jeannie Fend, PA-C  benzonatate (TESSALON) 200 MG capsule Take 1 capsule (200 mg total) by mouth every 8 (eight) hours for 10 days. 02/18/22 02/28/22 Yes Jeannie Fend, PA-C  levonorgestrel (MIRENA) 20 MCG/24HR IUD 1 each by Intrauterine route once. 01/03/21   [provider]  labetalol (NORMODYNE) 200 MG tablet Take 1 tablet (200 mg total) by mouth 2 (two) times daily. Patient not taking: No sig reported 02/29/20 09/13/20  Malachy Chamber, MD      Allergies    Penicillins, Morphine and related, Bactrim [sulfamethoxazole-trimethoprim], and Elemental sulfur    Review of Systems   Review of Systems Negative except as per HPI Physical Exam Updated Vital Signs BP (!) 154/100 (BP Location: Right Arm)   Pulse 77   Temp 99.5 F (37.5 C) (Oral)   Resp 16   SpO2 100%  Physical Exam Vitals and nursing note reviewed.  Constitutional:      General: She is not  in acute distress.    Appearance: She is well-developed. She is not diaphoretic.  HENT:     Head: Normocephalic and atraumatic.     Right Ear: Tympanic membrane and ear canal normal.     Left Ear: Tympanic membrane and ear canal normal.     Nose: Congestion present.     Mouth/Throat:     Mouth: Mucous membranes are moist.     Pharynx: Oropharynx is clear. Uvula midline. Posterior oropharyngeal erythema present. No oropharyngeal exudate or uvula swelling.     Tonsils: 1+ on the right. 1+ on the left.  Eyes:     Conjunctiva/sclera: Conjunctivae normal.  Cardiovascular:     Rate and Rhythm: Normal rate and regular rhythm.     Heart sounds: Normal heart sounds.  Pulmonary:     Effort: Pulmonary effort is normal.     Breath sounds: Normal breath sounds.  Musculoskeletal:     Cervical back: Neck supple. No tenderness.  Skin:    General: Skin is warm and dry.  Neurological:     Mental Status: She is alert and oriented to person, place, and time.  Psychiatric:        Behavior: Behavior normal.    ED Results / Procedures / Treatments   Labs (all labs ordered are listed, but only abnormal results are displayed) Labs Reviewed  RESP PANEL BY RT-PCR (FLU A&B, COVID) ARPGX2    EKG None  Radiology MR KNEE RIGHT  WO CONTRAST  Result Date: 02/18/2022 CLINICAL DATA:  Right knee buckling and popping for 2 months. No known injury. EXAM: MRI OF THE RIGHT KNEE WITHOUT CONTRAST TECHNIQUE: Multiplanar, multisequence MR imaging of the knee was performed. No intravenous contrast was administered. COMPARISON:  07/29/2021 FINDINGS: MENISCI Medial: Severe attenuation of the body and posterior horn of the medial meniscus consistent with prior meniscectomy. Complex signal in the posterior horn-body junction of the medial meniscus which may reflect postsurgical changes versus a recurrent complex tear. Lateral: Small oblique tear of the posterior horn of the lateral meniscus extending to the inferior  articular surface. LIGAMENTS Cruciates: ACL and PCL are intact. Collaterals: Medial collateral ligament is intact. Lateral collateral ligament complex is intact. CARTILAGE Patellofemoral:  No chondral defect. Medial: Mild partial-thickness cartilage loss of the medial femorotibial compartment. Lateral:  No chondral defect. JOINT: No joint effusion. Normal Hoffa's fat-pad. No plical thickening. POPLITEAL FOSSA: Popliteus tendon is intact. No Baker's cyst. EXTENSOR MECHANISM: Intact quadriceps tendon. Intact patellar tendon. Intact lateral patellar retinaculum. Intact medial patellar retinaculum. Intact MPFL. BONES: No aggressive osseous lesion. No fracture or dislocation. Other: No fluid collection or hematoma. Muscles are normal. IMPRESSION: 1. Severe attenuation of the body and posterior horn of the medial meniscus consistent with prior meniscectomy. Complex signal in the posterior horn-body junction of the medial meniscus which may reflect postsurgical changes versus a recurrent complex tear. 2. Small oblique tear of the posterior horn of the lateral meniscus extending to the inferior articular surface. Electronically Signed   By: Elige Ko M.D.   On: 02/18/2022 14:26    Procedures Procedures    Medications Ordered in ED Medications - No data to display  ED Course/ Medical Decision Making/ A&P                           Medical Decision Making Risk Prescription drug management.   31 year old female with concern for viral URI symptoms with positive home COVID test.  Plan is to provide COVID test as requested for purpose of patient's employer, this result will be available in her MyChart account later this evening.,  We will treat her symptomatically with Ventolin and Tessalon.        Final Clinical Impression(s) / ED Diagnoses Final diagnoses:  COVID    Rx / DC Orders ED Discharge Orders          Ordered    albuterol (VENTOLIN HFA) 108 (90 Base) MCG/ACT inhaler  Every 6 hours PRN         02/18/22 1900    benzonatate (TESSALON) 200 MG capsule  Every 8 hours        02/18/22 1900              Jeannie Fend, PA-C 02/18/22 1911    Tegeler, Canary Brim, MD 02/18/22 2130

## 2022-02-18 NOTE — ED Notes (Signed)
Patient Alert and oriented to baseline. Stable and ambulatory to baseline. Patient verbalized understanding of the discharge instructions.  Patient belongings were taken by the patient.   

## 2022-02-18 NOTE — ED Triage Notes (Signed)
Pt here with generalized body aches and chills onset today. States +covid test at  home.

## 2022-11-17 ENCOUNTER — Emergency Department (HOSPITAL_COMMUNITY)
Admission: EM | Admit: 2022-11-17 | Discharge: 2022-11-17 | Disposition: A | Discharge status: Home / Self Care | Payer: Medicaid Other | Attending: Emergency Medicine | Admitting: Emergency Medicine

## 2022-11-17 ENCOUNTER — Emergency Department (HOSPITAL_COMMUNITY): Payer: Medicaid Other

## 2022-11-17 ENCOUNTER — Encounter (HOSPITAL_COMMUNITY): Payer: Self-pay

## 2022-11-17 ENCOUNTER — Other Ambulatory Visit: Payer: Self-pay

## 2022-11-17 DIAGNOSIS — G8929 Other chronic pain: Secondary | ICD-10-CM

## 2022-11-17 DIAGNOSIS — M25561 Pain in right knee: Secondary | ICD-10-CM | POA: Insufficient documentation

## 2022-11-17 MED ORDER — MELOXICAM 15 MG PO TABS: 15.0000 mg | ORAL_TABLET | Freq: Every day | ORAL | 0 refills | Status: DC

## 2022-11-17 MED ORDER — TRAMADOL HCL 50 MG PO TABS: 50.0000 mg | ORAL_TABLET | Freq: Four times a day (QID) | ORAL | 0 refills | Status: DC | PRN

## 2022-11-17 MED ORDER — MELOXICAM 15 MG PO TABS
15.0000 mg | ORAL_TABLET | Freq: Every day | ORAL | 0 refills | Status: DC
Start: 2022-11-17 — End: 2022-11-17

## 2022-11-17 NOTE — ED Triage Notes (Signed)
Patient had right knee surgery over a year ago. Has been having increased pain but trouble to bear weight on her knee over the last 3 days. Pain moves up her leg. No numbness or tingling.

## 2022-11-17 NOTE — Discharge Instructions (Signed)
Contact a health care provider if: You have knee pain that is not getting better or gets worse. You are unable to do your physical therapy exercises due to knee pain. Get help right away if: Your knee swells and the swelling becomes worse. You cannot move your knee. You have severe knee pain.

## 2022-11-17 NOTE — ED Provider Notes (Signed)
[place] [facility] Provider Note   CSN: [id number] Arrival date & time: 11/17/22  1621     History  Chief Complaint  Patient presents with   Knee Pain    Janice Duarte is a 32 y.o. female.  With a known history of bilateral meniscal tears.  She had a previous arthroscopic surgery on the knee 2 years ago.  She states that she had a lot of issues after her surgery and her knee never fully got better but recently the pain has gotten a lot worse to the point where she is really had a lot of trouble ambulating.  She did return in a follow-up visit with her orthopedist who was told her that she had a second meniscus tear after her MRI.  Patient states that her pain has become severe over the past 4 days to having trouble ambulating and she "just cannot deal with another surgery."  She denies any new injuries.  She has not been wearing her brace at home.  She has not been icing her knee.   Knee Pain      Home Medications Prior to Admission medications   Medication Sig Start Date End Date Taking? Authorizing Provider  meloxicam (MOBIC) 15 MG tablet Take 1 tablet (15 mg total) by mouth daily. 11/17/22  Yes Mardy Lucier, PA-C  traMADol (ULTRAM) 50 MG tablet Take 1 tablet (50 mg total) by mouth every 6 (six) hours as needed. 11/17/22  Yes Kae Lauman, PA-C  albuterol (VENTOLIN HFA) 108 (90 Base) MCG/ACT inhaler Inhale 1-2 puffs into the lungs every 6 (six) hours as needed for wheezing or shortness of breath. 02/18/22   Tacy Learn, PA-C  levonorgestrel (MIRENA) 20 MCG/24HR IUD 1 each by Intrauterine route once. 01/03/21   [provider]  labetalol (NORMODYNE) 200 MG tablet Take 1 tablet (200 mg total) by mouth 2 (two) times daily. Patient not taking: No sig reported 02/29/20 09/13/20  Cherre Blanc, MD      Allergies    Penicillins, Morphine and related, Bactrim [sulfamethoxazole-trimethoprim], and Elemental sulfur    Review of  Systems   Review of Systems  Physical Exam Updated Vital Signs BP (!) 148/102 (BP Location: Left Arm)   Pulse 97   Temp 98.2 F (36.8 C) (Oral)   Resp 18   Ht 5' 5"$  (1.651 m)   Wt 77.1 kg   SpO2 100%   BMI 28.29 kg/m  Physical Exam Vitals and nursing note reviewed.  Constitutional:      General: She is not in acute distress.    Appearance: She is well-developed. She is not diaphoretic.  HENT:     Head: Normocephalic and atraumatic.     Right Ear: External ear normal.     Left Ear: External ear normal.     Nose: Nose normal.     Mouth/Throat:     Mouth: Mucous membranes are moist.  Eyes:     General: No scleral icterus.    Conjunctiva/sclera: Conjunctivae normal.  Cardiovascular:     Rate and Rhythm: Normal rate and regular rhythm.     Heart sounds: Normal heart sounds. No murmur heard.    No friction rub. No gallop.  Pulmonary:     Effort: Pulmonary effort is normal. No respiratory distress.     Breath sounds: Normal breath sounds.  Abdominal:     General: Bowel sounds are normal. There is no distension.     Palpations: Abdomen is  soft. There is no mass.     Tenderness: There is no abdominal tenderness. There is no guarding.  Musculoskeletal:        General: Swelling and tenderness present. No deformity.     Cervical back: Normal range of motion.     Comments: Right knee exam with old arthroscopic surgical scars.  No effusion, range of motion is limited due to pain however ligaments feel stable.  Skin:    General: Skin is warm and dry.  Neurological:     Mental Status: She is alert and oriented to person, place, and time.  Psychiatric:        Behavior: Behavior normal.     ED Results / Procedures / Treatments   Labs (all labs ordered are listed, but only abnormal results are displayed) Labs Reviewed - No data to display  EKG None  Radiology DG Knee Complete 4 Views Right  Result Date: 11/17/2022 CLINICAL DATA:  Knee pain. Increasing pain. Prior  surgery. EXAM: RIGHT KNEE - COMPLETE 4+ VIEW COMPARISON:  Radiograph 06/19/2021 FINDINGS: No evidence of fracture, dislocation, or joint effusion. The joint spaces are normal. The alignment is preserved. No evidence of arthropathy or other focal bone abnormality. Soft tissues are unremarkable. IMPRESSION: Negative radiographs of the right knee. Electronically Signed   By: Keith Rake M.D.   On: 11/17/2022 17:30    Procedures Procedures    Medications Ordered in ED Medications - No data to display  ED Course/ Medical Decision Making/ A&P                             Medical Decision Making I visualized and interpreted right knee x-ray which shows no acute findings.  Patient has no meniscal tears.  She has no acute findings.  Will discharge with tramadol and Mobic.PDMP reviewed during this encounter. Encouraged the patient to follow-up with her orthopedist or seek second opinion.  She appears otherwise appropriate for discharge at this time  Amount and/or Complexity of Data Reviewed Radiology: ordered.  Risk Prescription drug management.           Final Clinical Impression(s) / ED Diagnoses Final diagnoses:  Chronic pain of right knee    Rx / DC Orders ED Discharge Orders          Ordered    meloxicam (MOBIC) 15 MG tablet  Daily        11/17/22 1744    traMADol (ULTRAM) 50 MG tablet  Every 6 hours PRN        11/17/22 1744              Margarita Mail, PA-C 11/17/22 1852    Godfrey Pick, MD 11/18/22 0200

## 2022-12-26 ENCOUNTER — Emergency Department (HOSPITAL_COMMUNITY)
Admission: EM | Admit: 2022-12-26 | Discharge: 2022-12-26 | Discharge status: Against Medical Advice | Payer: Medicaid Other | Attending: Emergency Medicine | Admitting: Emergency Medicine

## 2022-12-26 ENCOUNTER — Encounter (HOSPITAL_COMMUNITY): Payer: Self-pay

## 2022-12-26 ENCOUNTER — Other Ambulatory Visit: Payer: Self-pay

## 2022-12-26 ENCOUNTER — Encounter (HOSPITAL_BASED_OUTPATIENT_CLINIC_OR_DEPARTMENT_OTHER): Payer: Self-pay | Admitting: Emergency Medicine

## 2022-12-26 ENCOUNTER — Emergency Department (HOSPITAL_BASED_OUTPATIENT_CLINIC_OR_DEPARTMENT_OTHER)
Admission: EM | Admit: 2022-12-26 | Discharge: 2022-12-26 | Disposition: A | Discharge status: Home / Self Care | Payer: Medicaid Other | Source: Home / Self Care | Attending: Emergency Medicine | Admitting: Emergency Medicine

## 2022-12-26 ENCOUNTER — Emergency Department (HOSPITAL_COMMUNITY): Payer: Medicaid Other

## 2022-12-26 DIAGNOSIS — M25561 Pain in right knee: Secondary | ICD-10-CM | POA: Insufficient documentation

## 2022-12-26 DIAGNOSIS — M25562 Pain in left knee: Secondary | ICD-10-CM | POA: Insufficient documentation

## 2022-12-26 DIAGNOSIS — Y92410 Unspecified street and highway as the place of occurrence of the external cause: Secondary | ICD-10-CM | POA: Insufficient documentation

## 2022-12-26 DIAGNOSIS — M25511 Pain in right shoulder: Secondary | ICD-10-CM | POA: Insufficient documentation

## 2022-12-26 DIAGNOSIS — M25512 Pain in left shoulder: Secondary | ICD-10-CM | POA: Insufficient documentation

## 2022-12-26 DIAGNOSIS — M542 Cervicalgia: Secondary | ICD-10-CM | POA: Diagnosis not present

## 2022-12-26 DIAGNOSIS — M545 Low back pain, unspecified: Secondary | ICD-10-CM | POA: Insufficient documentation

## 2022-12-26 DIAGNOSIS — Z5321 Procedure and treatment not carried out due to patient leaving prior to being seen by health care provider: Secondary | ICD-10-CM | POA: Diagnosis not present

## 2022-12-26 MED ORDER — ACETAMINOPHEN 500 MG PO TABS
1000.0000 mg | ORAL_TABLET | Freq: Once | ORAL | Status: AC
  Administered 2022-12-26: 1000 mg via ORAL
  Filled 2022-12-26: qty 2

## 2022-12-26 MED ORDER — OXYCODONE HCL 5 MG PO TABS
5.0000 mg | ORAL_TABLET | Freq: Once | ORAL | Status: AC
  Administered 2022-12-26: 5 mg via ORAL
  Filled 2022-12-26: qty 1

## 2022-12-26 MED ORDER — KETOROLAC TROMETHAMINE 15 MG/ML IJ SOLN
15.0000 mg | Freq: Once | INTRAMUSCULAR | Status: DC
  Filled 2022-12-26: qty 1

## 2022-12-26 MED ORDER — METHOCARBAMOL 500 MG PO TABS: 500.0000 mg | ORAL_TABLET | Freq: Two times a day (BID) | ORAL | 0 refills | Status: AC
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
## 2022-12-26 NOTE — ED Notes (Signed)
Pt states they are leaving  
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
## 2022-12-31 ENCOUNTER — Other Ambulatory Visit: Payer: Self-pay

## 2022-12-31 ENCOUNTER — Emergency Department (HOSPITAL_BASED_OUTPATIENT_CLINIC_OR_DEPARTMENT_OTHER)
Admission: EM | Admit: 2022-12-31 | Discharge: 2022-12-31 | Disposition: A | Discharge status: Home / Self Care | Payer: Medicaid Other | Attending: Emergency Medicine | Admitting: Emergency Medicine

## 2022-12-31 ENCOUNTER — Encounter (HOSPITAL_BASED_OUTPATIENT_CLINIC_OR_DEPARTMENT_OTHER): Payer: Self-pay

## 2022-12-31 DIAGNOSIS — Z79899 Other long term (current) drug therapy: Secondary | ICD-10-CM | POA: Diagnosis not present

## 2022-12-31 DIAGNOSIS — I10 Essential (primary) hypertension: Secondary | ICD-10-CM | POA: Insufficient documentation

## 2022-12-31 DIAGNOSIS — S161XXA Strain of muscle, fascia and tendon at neck level, initial encounter: Secondary | ICD-10-CM | POA: Insufficient documentation

## 2022-12-31 DIAGNOSIS — Y92410 Unspecified street and highway as the place of occurrence of the external cause: Secondary | ICD-10-CM | POA: Diagnosis not present

## 2022-12-31 DIAGNOSIS — S199XXA Unspecified injury of neck, initial encounter: Secondary | ICD-10-CM | POA: Diagnosis present

## 2022-12-31 DIAGNOSIS — S39012A Strain of muscle, fascia and tendon of lower back, initial encounter: Secondary | ICD-10-CM | POA: Insufficient documentation

## 2022-12-31 MED ORDER — IBUPROFEN 600 MG PO TABS: 600.0000 mg | ORAL_TABLET | Freq: Three times a day (TID) | ORAL | 0 refills | Status: AC | PRN

## 2022-12-31 MED ORDER — OXYCODONE-ACETAMINOPHEN 5-325 MG PO TABS
2.0000 | ORAL_TABLET | Freq: Once | ORAL | Status: AC
  Administered 2022-12-31: 2 via ORAL
  Filled 2022-12-31: qty 2

## 2022-12-31 MED ORDER — CYCLOBENZAPRINE HCL 10 MG PO TABS: 10.0000 mg | ORAL_TABLET | Freq: Three times a day (TID) | ORAL | 0 refills | Status: AC | PRN
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
## 2022-12-31 NOTE — ED Notes (Signed)
Pt verbalized understanding of d/c instructions, meds, and followup care. Denies questions. VSS, no distress noted. Steady gait to exit with all belongings.  ?
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
## 2023-07-06 ENCOUNTER — Encounter (HOSPITAL_BASED_OUTPATIENT_CLINIC_OR_DEPARTMENT_OTHER): Payer: Self-pay

## 2023-07-06 ENCOUNTER — Emergency Department (HOSPITAL_BASED_OUTPATIENT_CLINIC_OR_DEPARTMENT_OTHER)
Admission: EM | Admit: 2023-07-06 | Discharge: 2023-07-06 | Discharge status: Against Medical Advice | Payer: Self-pay | Attending: Emergency Medicine | Admitting: Emergency Medicine

## 2023-07-06 ENCOUNTER — Other Ambulatory Visit: Payer: Self-pay

## 2023-07-06 DIAGNOSIS — H5789 Other specified disorders of eye and adnexa: Secondary | ICD-10-CM | POA: Insufficient documentation

## 2023-07-06 DIAGNOSIS — Z5321 Procedure and treatment not carried out due to patient leaving prior to being seen by health care provider: Secondary | ICD-10-CM | POA: Insufficient documentation

## 2023-07-06 NOTE — ED Triage Notes (Signed)
Pt states "something bit me- it's been up there since Friday, it's been hurting. I washed my hand & squoze it & my eye's real irritable now." States that eye has been weeping since. No fevers, additional symptoms

## 2023-07-07 ENCOUNTER — Ambulatory Visit (HOSPITAL_COMMUNITY)
Admission: EM | Admit: 2023-07-07 | Discharge: 2023-07-07 | Disposition: A | Discharge status: Home / Self Care | Payer: Medicaid Other | Attending: Family Medicine | Admitting: Family Medicine

## 2023-07-07 ENCOUNTER — Encounter (HOSPITAL_COMMUNITY): Payer: Self-pay

## 2023-07-07 DIAGNOSIS — L03211 Cellulitis of face: Secondary | ICD-10-CM

## 2023-07-07 MED ORDER — KETOROLAC TROMETHAMINE 30 MG/ML IJ SOLN
INTRAMUSCULAR | Status: AC
  Filled 2023-07-07: qty 1

## 2023-07-07 MED ORDER — CLINDAMYCIN HCL 300 MG PO CAPS
300.0000 mg | ORAL_CAPSULE | Freq: Three times a day (TID) | ORAL | 0 refills | Status: AC
Start: 2023-07-07 — End: 2023-07-12

## 2023-07-07 MED ORDER — KETOROLAC TROMETHAMINE 30 MG/ML IJ SOLN
30.0000 mg | Freq: Once | INTRAMUSCULAR | Status: AC
  Administered 2023-07-07: 30 mg via INTRAMUSCULAR

## 2023-07-07 MED ORDER — KETOROLAC TROMETHAMINE 10 MG PO TABS: 10.0000 mg | ORAL_TABLET | Freq: Four times a day (QID) | ORAL | 0 refills | Status: DC | PRN

## 2023-07-07 NOTE — Discharge Instructions (Signed)
You have been given a shot of Toradol 30 mg today.  Ketorolac 10 mg tablets--take 1 tablet every 6 hours as needed for pain.  This is the same medicine that is in the shot we just gave you  --Take clindamycin 300 mg-- 1 capsule 3 times daily for 5 days   Warm compresses to the area can help.  Do not squeeze pimples and things like this.

## 2023-07-07 NOTE — ED Triage Notes (Signed)
Pt states she thinks she was bit by a bug in her sleep.  States she woke up yesterday with a bite on the right side of her face and today it started to swell.

## 2023-07-07 NOTE — ED Provider Notes (Signed)
[facility]    CSN: [id number] Arrival date & time: 07/07/23  1525      History   Chief Complaint Chief Complaint  Patient presents with   Insect Bite    HPI Janice Duarte is a 32 y.o. female.   HPI Here for swelling and redness on her right cheek.  A few days ago she woke to find something like a pimple on her right cheek.  She then squeezed it and it did enlarge over the next few days.  No fever or chills or vomiting.  It has enlarged some and she feels like her right face is swollen now.  She is having some headache on the right side also.  Penicillin causes hives and she is allergic to sulfa which also causes a rash  She has not had a menstrual cycle in a long time; she has the Mirena IUD. Past Medical History:  Diagnosis Date   Abscess    Allergic rhinitis 12/28/2019   Alpha thalassemia silent carrier 02/08/2020   Bacterial vaginal infection    Headaches, cluster    Hidradenitis suppurativa    Breast, axillae, groin   Hypertension    Scoliosis    Sickle cell trait (HCC) 02/08/2020   Substance use 12/20/2019   + THC 12/20/2019    Patient Active Problem List   Diagnosis Date Noted   Trichomoniasis 01/10/2021    Past Surgical History:  Procedure Laterality Date   IUD INSERTION  01/04/2021       IUD REMOVAL  01/04/2021       WISDOM TOOTH EXTRACTION      OB History     Gravida  1   Para  1   Term  1   Preterm  0   AB  0   Living  1      SAB  0   IAB  0   Ectopic  0   Multiple  0   Live Births  1            Home Medications    Prior to Admission medications   Medication Sig Start Date End Date Taking? Authorizing Provider  clindamycin (CLEOCIN) 300 MG capsule Take 1 capsule (300 mg total) by mouth 3 (three) times daily for 5 days. 07/07/23 07/12/23 Yes Montrel Donahoe, Janace Aris, MD  ketorolac (TORADOL) 10 MG tablet Take 1 tablet (10 mg total) by mouth every 6 (six) hours as needed (pain). 07/07/23  Yes Zenia Resides, MD   albuterol (VENTOLIN HFA) 108 (90 Base) MCG/ACT inhaler Inhale 1-2 puffs into the lungs every 6 (six) hours as needed for wheezing or shortness of breath. 02/18/22   Jeannie Fend, PA-C  levonorgestrel (MIRENA) 20 MCG/24HR IUD 1 each by Intrauterine route once. 01/03/21   [provider]  methocarbamol (ROBAXIN) 500 MG tablet Take 1 tablet (500 mg total) by mouth 2 (two) times daily. 12/26/22   Glyn Ade, MD  traMADol (ULTRAM) 50 MG tablet Take 1 tablet (50 mg total) by mouth every 6 (six) hours as needed. 11/17/22   Arthor Captain, PA-C  labetalol (NORMODYNE) 200 MG tablet Take 1 tablet (200 mg total) by mouth 2 (two) times daily. Patient not taking: No sig reported 02/29/20 09/13/20  Malachy Chamber, MD    Family History Family History  Problem Relation Age of Onset   Diabetes Other    Hypertension Other    Hypertension Mother    Heart failure Mother    Hepatitis  B Mother    Cirrhosis Mother    Hypertension Paternal Aunt    Hypertension Paternal Grandmother    Thyroid disease Paternal Grandmother     Social History Social History   Tobacco Use   Smoking status: Some Days    Types: Cigars   Smokeless tobacco: Never  Vaping Use   Vaping status: Never Used  Substance Use Topics   Alcohol use: Yes    Comment: Occ   Drug use: Not Currently    Frequency: 7.0 times per week    Types: Marijuana    Comment: LAST SMOKED 12-29-19     Allergies   Penicillins, Morphine and codeine, Bactrim [sulfamethoxazole-trimethoprim], and Elemental sulfur   Review of Systems Review of Systems   Physical Exam Triage Vital Signs ED Triage Vitals  Encounter Vitals Group     BP 07/07/23 1551 139/88     Systolic BP Percentile --      Diastolic BP Percentile --      Pulse Rate 07/07/23 1551 82     Resp 07/07/23 1551 16     Temp 07/07/23 1551 98.6 F (37 C)     Temp Source 07/07/23 1551 Oral     SpO2 07/07/23 1551 98 %     Weight --      Height --      Head  Circumference --      Peak Flow --      Pain Score 07/07/23 1552 6     Pain Loc --      Pain Education --      Exclude from Growth Chart --    No data found.  Updated Vital Signs BP 139/88 (BP Location: Left Arm)   Pulse 82   Temp 98.6 F (37 C) (Oral)   Resp 16   SpO2 98%   Visual Acuity Right Eye Distance:   Left Eye Distance:   Bilateral Distance:    Right Eye Near:   Left Eye Near:    Bilateral Near:     Physical Exam Vitals reviewed.  Constitutional:      General: She is not in acute distress.    Appearance: She is not ill-appearing, toxic-appearing or diaphoretic.  HENT:     Mouth/Throat:     Mouth: Mucous membranes are moist.  Eyes:     Extraocular Movements: Extraocular movements intact.     Conjunctiva/sclera: Conjunctivae normal.     Pupils: Pupils are equal, round, and reactive to light.  Cardiovascular:     Rate and Rhythm: Normal rate and regular rhythm.     Heart sounds: No murmur heard. Pulmonary:     Effort: Pulmonary effort is normal.     Breath sounds: Normal breath sounds.  Musculoskeletal:     Cervical back: Neck supple.  Lymphadenopathy:     Cervical: No cervical adenopathy.  Skin:    Coloration: Skin is not pale.     Comments: There is some induration about half a centimeter in diameter on her right cheek.  It is firm without fluctuance.  Neurological:     General: No focal deficit present.     Mental Status: She is alert and oriented to person, place, and time.  Psychiatric:        Behavior: Behavior normal.      UC Treatments / Results  Labs (all labs ordered are listed, but only abnormal results are displayed) Labs Reviewed - No data to display  EKG   Radiology No results found.  Procedures Procedures (including critical care time)  Medications Ordered in UC Medications  ketorolac (TORADOL) 30 MG/ML injection 30 mg (has no administration in time range)    Initial Impression / Assessment and Plan / UC Course  I  have reviewed the triage vital signs and the nursing notes.  Pertinent labs & imaging results that were available during my care of the patient were reviewed by me and considered in my medical decision making (see chart for details).       Clindamycin is sent in for the cellulitis/folliculitis.  This time it does not need a drainage procedure.  Toradol injection is given for the pain and Toradol tablets are sent to the pharmacy Final Clinical Impressions(s) / UC Diagnoses   Final diagnoses:  Cellulitis of face     Discharge Instructions      You have been given a shot of Toradol 30 mg today.  Ketorolac 10 mg tablets--take 1 tablet every 6 hours as needed for pain.  This is the same medicine that is in the shot we just gave you  --Take clindamycin 300 mg-- 1 capsule 3 times daily for 5 days   Warm compresses to the area can help.  Do not squeeze pimples and things like this.     ED Prescriptions     Medication Sig Dispense Auth. Provider   clindamycin (CLEOCIN) 300 MG capsule Take 1 capsule (300 mg total) by mouth 3 (three) times daily for 5 days. 15 capsule Zenia Resides, MD   ketorolac (TORADOL) 10 MG tablet Take 1 tablet (10 mg total) by mouth every 6 (six) hours as needed (pain). 20 tablet Kariel Skillman, Janace Aris, MD      PDMP not reviewed this encounter.   Zenia Resides, MD 07/07/23 [phone]

## 2023-08-16 ENCOUNTER — Emergency Department (HOSPITAL_BASED_OUTPATIENT_CLINIC_OR_DEPARTMENT_OTHER)
Admission: EM | Admit: 2023-08-16 | Discharge: 2023-08-16 | Disposition: A | Discharge status: Home / Self Care | Payer: Medicaid Other | Attending: Emergency Medicine | Admitting: Emergency Medicine

## 2023-08-16 ENCOUNTER — Emergency Department (HOSPITAL_BASED_OUTPATIENT_CLINIC_OR_DEPARTMENT_OTHER): Payer: Medicaid Other

## 2023-08-16 ENCOUNTER — Encounter (HOSPITAL_BASED_OUTPATIENT_CLINIC_OR_DEPARTMENT_OTHER): Payer: Self-pay

## 2023-08-16 ENCOUNTER — Other Ambulatory Visit: Payer: Self-pay

## 2023-08-16 DIAGNOSIS — Z79899 Other long term (current) drug therapy: Secondary | ICD-10-CM | POA: Insufficient documentation

## 2023-08-16 DIAGNOSIS — I10 Essential (primary) hypertension: Secondary | ICD-10-CM | POA: Insufficient documentation

## 2023-08-16 DIAGNOSIS — R0789 Other chest pain: Secondary | ICD-10-CM | POA: Insufficient documentation

## 2023-08-16 LAB — BASIC METABOLIC PANEL
Anion gap: 7 (ref 5–15)
BUN: 8 mg/dL (ref 6–20)
CO2: 29 mmol/L (ref 22–32)
Calcium: 9.4 mg/dL (ref 8.9–10.3)
Chloride: 99 mmol/L (ref 98–111)
Creatinine, Ser: 0.85 mg/dL (ref 0.44–1.00)
GFR, Estimated: >60 mL/min (ref >60)
Glucose, Bld: 89 mg/dL (ref 70–99)
Potassium: 3.5 mmol/L (ref 3.5–5.1)
Sodium: 135 mmol/L (ref 135–145)

## 2023-08-16 LAB — CBC
HCT: 36.9 % (ref 36.0–46.0)
Hemoglobin: 12.7 g/dL (ref 12.0–15.0)
MCH: 27 pg (ref 26.0–34.0)
MCHC: 34.4 g/dL (ref 30.0–36.0)
MCV: 78.3 fL — ABNORMAL LOW (ref 80.0–100.0)
Platelets: 317 10*3/uL (ref 150–400)
RBC: 4.71 MIL/uL (ref 3.87–5.11)
RDW: 13.1 % (ref 11.5–15.5)
WBC: 14.1 10*3/uL — ABNORMAL HIGH (ref 4.0–10.5)
nRBC: 0 % (ref 0.0–0.2)

## 2023-08-16 LAB — PREGNANCY, URINE: Preg Test, Ur: NEGATIVE

## 2023-08-16 LAB — TROPONIN I (HIGH SENSITIVITY): Troponin I (High Sensitivity): <2 ng/L (ref <18)

## 2023-08-16 MED ORDER — KETOROLAC TROMETHAMINE 30 MG/ML IJ SOLN
30.0000 mg | Freq: Once | INTRAMUSCULAR | Status: AC
  Administered 2023-08-16: 30 mg via INTRAVENOUS
  Filled 2023-08-16: qty 1

## 2023-08-16 MED ORDER — NAPROXEN 500 MG PO TABS: 500.0000 mg | ORAL_TABLET | Freq: Two times a day (BID) | ORAL | 0 refills | Status: DC

## 2023-08-16 NOTE — Discharge Instructions (Signed)
Begin taking naproxen as prescribed.  Rest.  Follow-up with your primary doctor if symptoms are not improving in the next 3 to 4 days.

## 2023-08-16 NOTE — ED Provider Notes (Signed)
[place] EMERGENCY DEPARTMENT AT [facility] Provider Note   CSN: [id number] Arrival date & time: 08/16/23  0028     History  Chief Complaint  Patient presents with   Chest Pain    Janice Duarte is a 32 y.o. female.  Patient is a 33 year old female with history of hypertension presenting with complaints of chest discomfort.  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  She describes a sharp pain to the center of her chest that radiates into her right shoulder and neck.  The pain is worse when she moves, sits forward, or moves her right arm.  She denies any shortness of breath, nausea, or diaphoresis.  She denies any recent exertional symptoms.  No alleviating factors.  The history is provided by the patient.       Home Medications Prior to Admission medications   Medication Sig Start Date End Date Taking? Authorizing Provider  albuterol (VENTOLIN HFA) 108 (90 Base) MCG/ACT inhaler Inhale 1-2 puffs into the lungs every 6 (six) hours as needed for wheezing or shortness of breath. 02/18/22   Jeannie Fend, PA-C  ketorolac (TORADOL) 10 MG tablet Take 1 tablet (10 mg total) by mouth every 6 (six) hours as needed (pain). 07/07/23   Zenia Resides, MD  levonorgestrel (MIRENA) 20 MCG/24HR IUD 1 each by Intrauterine route once. 01/03/21   [provider]  methocarbamol (ROBAXIN) 500 MG tablet Take 1 tablet (500 mg total) by mouth 2 (two) times daily. 12/26/22   Glyn Ade, MD  traMADol (ULTRAM) 50 MG tablet Take 1 tablet (50 mg total) by mouth every 6 (six) hours as needed. 11/17/22   Arthor Captain, PA-C  labetalol (NORMODYNE) 200 MG tablet Take 1 tablet (200 mg total) by mouth 2 (two) times daily. Patient not taking: No sig reported 02/29/20 09/13/20  Malachy Chamber, MD      Allergies    Penicillins, Morphine and codeine, Bactrim [sulfamethoxazole-trimethoprim], and Elemental sulfur    Review of Systems   Review of Systems  All  other systems reviewed and are negative.   Physical Exam Updated Vital Signs BP (!) 131/103   Pulse 75   Temp 97.9 F (36.6 C)   Resp 16   Ht 5\' 5"  (1.651 m)   Wt 74.8 kg   LMP  (LMP Unknown)   SpO2 100%   BMI 27.46 kg/m  Physical Exam Vitals and nursing note reviewed.  Constitutional:      General: She is not in acute distress.    Appearance: She is well-developed. She is not diaphoretic.  HENT:     Head: Normocephalic and atraumatic.  Cardiovascular:     Rate and Rhythm: Normal rate and regular rhythm.     Heart sounds: No murmur heard.    No friction rub. No gallop.     Comments: There is tenderness to palpation over the anterior chest wall with no palpable abnormality or crepitus.  This reproduces her symptoms. Pulmonary:     Effort: Pulmonary effort is normal. No respiratory distress.     Breath sounds: Normal breath sounds. No wheezing.  Abdominal:     General: Bowel sounds are normal. There is no distension.     Palpations: Abdomen is soft.     Tenderness: There is no abdominal tenderness.  Musculoskeletal:        General: Normal range of motion.     Cervical back: Normal range of motion and neck supple.  Skin:  General: Skin is warm and dry.  Neurological:     General: No focal deficit present.     Mental Status: She is alert and oriented to person, place, and time.     ED Results / Procedures / Treatments   Labs (all labs ordered are listed, but only abnormal results are displayed) Labs Reviewed  CBC - Abnormal; Notable for the following components:      Result Value   WBC 14.1 (*)    MCV 78.3 (*)    All other components within normal limits  BASIC METABOLIC PANEL  PREGNANCY, URINE  TROPONIN I (HIGH SENSITIVITY)    EKG EKG Interpretation Date/Time:  Sunday August 16 2023 00:40:58 EST Ventricular Rate:  81 PR Interval:  142 QRS Duration:  80 QT Interval:  376 QTC Calculation: 436 R Axis:   52  Text Interpretation: Normal sinus rhythm  with sinus arrhythmia Low voltage QRS Nonspecific T wave abnormality Abnormal ECG When compared with ECG of 13-Sep-2020 20:21, no significant change is noted Confirmed by Geoffery Lyons ([id number]) on 08/16/2023 1:34:35 AM  Radiology DG Chest Port 1 View  Result Date: 08/16/2023 CLINICAL DATA:  Right-sided chest pain EXAM: PORTABLE CHEST 1 VIEW COMPARISON:  01/19/2022 FINDINGS: The heart size and mediastinal contours are within normal limits. Both lungs are clear. Stable scoliosis of the thoracic spine is noted. IMPRESSION: No active disease. Electronically Signed   By: Alcide Clever M.D.   On: 08/16/2023 02:02    Procedures Procedures    Medications Ordered in ED Medications  ketorolac (TORADOL) 30 MG/ML injection 30 mg (30 mg Intravenous Given 08/16/23 0204)    ED Course/ Medical Decision Making/ A&P  Patient is a 32 year old female presenting with chest pain as described in the HPI.  Patient arrives here with stable vital signs and is afebrile.  Physical examination reveals reproducible pain to the anterior chest wall.  Workup initiated including CBC, metabolic panel, and troponin, all of which are normal.  Chest x-ray is clear.  Patient given IV Toradol with good results.  At this point, I feel as though this pain is musculoskeletal in nature.  Her workup is normal, she has no risk factors, and pain is reproducible with movement and palpation.  Patient will be discharged with naproxen, rest, and follow-up as needed.  Final Clinical Impression(s) / ED Diagnoses Final diagnoses:  None    Rx / DC Orders ED Discharge Orders     None         Geoffery Lyons, MD 08/16/23 [phone]

## 2023-08-16 NOTE — ED Triage Notes (Signed)
Pt states that she had sudden onset of right sided chest pain that radiates into her right shoulder at approx 1800. Pt states that she was at work when it started. Pt denies specific injury. Pain is described as sharp and constant.

## 2023-11-07 ENCOUNTER — Other Ambulatory Visit: Payer: Self-pay

## 2023-11-07 DIAGNOSIS — Z20822 Contact with and (suspected) exposure to covid-19: Secondary | ICD-10-CM | POA: Insufficient documentation

## 2023-11-07 DIAGNOSIS — J069 Acute upper respiratory infection, unspecified: Secondary | ICD-10-CM | POA: Insufficient documentation

## 2023-11-07 NOTE — ED Triage Notes (Signed)
 Pt POV reporting flu-like sx x2 days, denies SOB or chest pain.

## 2023-11-08 ENCOUNTER — Emergency Department (HOSPITAL_BASED_OUTPATIENT_CLINIC_OR_DEPARTMENT_OTHER)
Admission: EM | Admit: 2023-11-08 | Discharge: 2023-11-08 | Disposition: A | Discharge status: Home / Self Care | Payer: Self-pay | Attending: Emergency Medicine | Admitting: Emergency Medicine

## 2023-11-08 DIAGNOSIS — J069 Acute upper respiratory infection, unspecified: Secondary | ICD-10-CM

## 2023-11-08 LAB — RESP PANEL BY RT-PCR (RSV, FLU A&B, COVID)  RVPGX2
Influenza A by PCR: NEGATIVE
Influenza B by PCR: NEGATIVE
Resp Syncytial Virus by PCR: NEGATIVE
SARS Coronavirus 2 by RT PCR: NEGATIVE

## 2023-11-08 NOTE — Discharge Instructions (Addendum)
 You have symptoms of a likely upper respiratory infection.  Your overall exam was reassuring.  Take Tylenol  and ibuprofen  for symptomatic management and continue to orally rehydrate.

## 2023-11-08 NOTE — ED Provider Notes (Signed)
 [place] EMERGENCY DEPARTMENT AT [facility] Provider Note   CSN: [id number] Arrival date & time: 11/07/23  2307     History  Chief Complaint  Patient presents with   Nasal Congestion    Janice Duarte is a 33 y.o. female.  HPI   33 year old female presenting to the emergency department with 2 days of URI symptoms.  The patient endorses myalgias, cough for the past 2 days.  She is tolerating oral intake.  Denies any sore throat, endorses a scratchy throat, denies any shortness of breath or chest pain.  No abdominal pain, nausea or vomiting.  Patient overall tolerating oral intake.  Home Medications Prior to Admission medications   Medication Sig Start Date End Date Taking? Authorizing Provider  albuterol  (VENTOLIN  HFA) 108 (90 Base) MCG/ACT inhaler Inhale 1-2 puffs into the lungs every 6 (six) hours as needed for wheezing or shortness of breath. 02/18/22   Beverley Leita LABOR, PA-C  ketorolac  (TORADOL ) 10 MG tablet Take 1 tablet (10 mg total) by mouth every 6 (six) hours as needed (pain). 07/07/23   Vonna Sharlet POUR, MD  levonorgestrel  (MIRENA ) 20 MCG/24HR IUD 1 each by Intrauterine route once. 01/03/21   [provider]  methocarbamol  (ROBAXIN ) 500 MG tablet Take 1 tablet (500 mg total) by mouth 2 (two) times daily. 12/26/22   Jerral Meth, MD  naproxen  (NAPROSYN ) 500 MG tablet Take 1 tablet (500 mg total) by mouth 2 (two) times daily. 08/16/23   Geroldine Berg, MD  traMADol  (ULTRAM ) 50 MG tablet Take 1 tablet (50 mg total) by mouth every 6 (six) hours as needed. 11/17/22   Harris, Abigail, PA-C  labetalol  (NORMODYNE ) 200 MG tablet Take 1 tablet (200 mg total) by mouth 2 (two) times daily. Patient not taking: No sig reported 02/29/20 09/13/20  Trudy Rozelle DASEN, MD      Allergies    Penicillins, Morphine  and codeine, Bactrim [sulfamethoxazole-trimethoprim], and Elemental sulfur    Review of Systems   Review of Systems  All other systems reviewed and are  negative.   Physical Exam Updated Vital Signs BP (!) 151/99 (BP Location: Right Arm)   Pulse 66   Temp 97.8 F (36.6 C) (Oral)   Resp 16   Ht 5' 5 (1.651 m)   SpO2 100%   BMI 27.46 kg/m  Physical Exam Vitals and nursing note reviewed.  Constitutional:      General: She is not in acute distress.    Appearance: She is well-developed.  HENT:     Head: Normocephalic and atraumatic.  Eyes:     Conjunctiva/sclera: Conjunctivae normal.  Cardiovascular:     Rate and Rhythm: Normal rate and regular rhythm.  Pulmonary:     Effort: Pulmonary effort is normal. No respiratory distress.     Breath sounds: Normal breath sounds.  Abdominal:     Palpations: Abdomen is soft.     Tenderness: There is no abdominal tenderness.  Musculoskeletal:        General: No swelling.     Cervical back: Neck supple.  Skin:    General: Skin is warm and dry.     Capillary Refill: Capillary refill takes less than 2 seconds.  Neurological:     Mental Status: She is alert.  Psychiatric:        Mood and Affect: Mood normal.     ED Results / Procedures / Treatments   Labs (all labs ordered are listed, but only abnormal results are displayed) Labs Reviewed  RESP  PANEL BY RT-PCR (RSV, FLU A&B, COVID)  [id number]    EKG None  Radiology No results found.  Procedures Procedures    Medications Ordered in ED Medications - No data to display  ED Course/ Medical Decision Making/ A&P                                 Medical Decision Making   33 year old female presenting to the emergency department with 2 days of URI symptoms.  The patient endorses myalgias, cough for the past 2 days.  She is tolerating oral intake.  Denies any sore throat, endorses a scratchy throat, denies any shortness of breath or chest pain.  No abdominal pain, nausea or vomiting.  Patient overall tolerating oral intake.  Janice Duarte is a 33 y.o. female who presents to the ED with a 2 day history of rhinorrhea, cough  and nasal congestion.  On my exam, the patient is well-appearing and well-hydrated.  The patient's lungs are clear to auscultation bilaterally. Additionally, the patient has a soft/non-tender abdomen, and no oropharyngeal exudates.  There are no signs of meningismus.  I see no signs of an acute bacterial infection.  The patient's presentation is most consistent with a viral upper respiratory infection.  I have a low suspicion for pneumonia as the patient's cough has been non-productive and the patient is neither tachypneic nor hypoxic on room air.  Additionally, the patient is CTAB.  Influenza/Influenza-Like Illness is possible, especially considering the current prevalence of disease.  COVID-19, influenza, RSV PCR testing was collected and resulted negative.  Symptoms consistent with likely viral upper respiratory infection.  I discussed symptomatic management, including hydration, motrin , and tylenol . The patient felt safe being discharged from the ED.  They agreed to followup with the PCP if needed.  I provided ED return precautions.   Final Clinical Impression(s) / ED Diagnoses Final diagnoses:  Upper respiratory tract infection, unspecified type    Rx / DC Orders ED Discharge Orders     None         Jerrol Agent, MD 11/08/23 0246

## 2024-01-26 ENCOUNTER — Encounter (HOSPITAL_BASED_OUTPATIENT_CLINIC_OR_DEPARTMENT_OTHER): Payer: Self-pay

## 2024-01-26 ENCOUNTER — Emergency Department (HOSPITAL_BASED_OUTPATIENT_CLINIC_OR_DEPARTMENT_OTHER): Payer: Self-pay | Admitting: Radiology

## 2024-01-26 ENCOUNTER — Emergency Department (HOSPITAL_BASED_OUTPATIENT_CLINIC_OR_DEPARTMENT_OTHER)
Admission: EM | Admit: 2024-01-26 | Discharge: 2024-01-26 | Disposition: A | Discharge status: Home / Self Care | Payer: Self-pay | Attending: Emergency Medicine | Admitting: Emergency Medicine

## 2024-01-26 DIAGNOSIS — M67874 Other specified disorders of tendon, left ankle and foot: Secondary | ICD-10-CM | POA: Insufficient documentation

## 2024-01-26 DIAGNOSIS — M778 Other enthesopathies, not elsewhere classified: Secondary | ICD-10-CM

## 2024-01-26 MED ORDER — NAPROXEN 500 MG PO TABS: 500.0000 mg | ORAL_TABLET | Freq: Two times a day (BID) | ORAL | 0 refills | Status: AC

## 2024-01-26 MED ORDER — NAPROXEN 250 MG PO TABS
500.0000 mg | ORAL_TABLET | Freq: Once | ORAL | Status: AC
  Administered 2024-01-26: 500 mg via ORAL
  Filled 2024-01-26: qty 2

## 2024-01-26 NOTE — ED Provider Notes (Signed)
 [place] EMERGENCY DEPARTMENT AT [facility]  Provider Note  CSN: [id number] Arrival date & time: 01/26/24 0118  History Chief Complaint  Patient presents with   Toe Pain    Janice Duarte is a 33 y.o. female here with toe pain for the last several months, worse in the last few days, located in L 2nd and 3rd toe and worse with movement/walking. She is unsure if she injured that area back when symptoms started, but no recent injury.    Home Medications Prior to Admission medications   Medication Sig Start Date End Date Taking? Authorizing Provider  albuterol  (VENTOLIN  HFA) 108 (90 Base) MCG/ACT inhaler Inhale 1-2 puffs into the lungs every 6 (six) hours as needed for wheezing or shortness of breath. 02/18/22   Darlis Eisenmenger, PA-C  levonorgestrel  (MIRENA ) 20 MCG/24HR IUD 1 each by Intrauterine route once. 01/03/21   [provider]  methocarbamol  (ROBAXIN ) 500 MG tablet Take 1 tablet (500 mg total) by mouth 2 (two) times daily. 12/26/22   Onetha Bile, MD  naproxen  (NAPROSYN ) 500 MG tablet Take 1 tablet (500 mg total) by mouth 2 (two) times daily. 01/26/24   Charmayne Cooper, MD  labetalol  (NORMODYNE ) 200 MG tablet Take 1 tablet (200 mg total) by mouth 2 (two) times daily. Patient not taking: No sig reported 02/29/20 09/13/20  Worley Headings, MD     Allergies    Penicillins, Morphine  and codeine, Bactrim [sulfamethoxazole-trimethoprim], and Elemental sulfur   Review of Systems   Review of Systems Please see HPI for pertinent positives and negatives  Physical Exam BP (!) 145/110   Pulse 72   Temp 97.8 F (36.6 C) (Temporal)   Resp 18   Ht 5\' 5"  (1.651 m)   Wt 68 kg   SpO2 100%   BMI 24.96 kg/m   Physical Exam Vitals and nursing note reviewed.  HENT:     Head: Normocephalic.     Nose: Nose normal.  Eyes:     Extraocular Movements: Extraocular movements intact.  Pulmonary:     Effort: Pulmonary effort is normal.  Musculoskeletal:         General: Tenderness (L 2nd and 3rd toe, no swelling or inflammation) present. Normal range of motion.     Cervical back: Neck supple.  Skin:    Findings: No rash (on exposed skin).  Neurological:     Mental Status: She is alert and oriented to person, place, and time.  Psychiatric:        Mood and Affect: Mood normal.     ED Results / Procedures / Treatments   EKG None  Procedures Procedures  Medications Ordered in the ED Medications  naproxen  (NAPROSYN ) tablet 500 mg (500 mg Oral Given 01/26/24 0239)    Initial Impression and Plan  Patient here with toe pain, ongoing for several months but worse recently. I personally viewed the images from radiology studies and agree with radiologist interpretation: Xray is neg for fracture. Likely a tendonitis. Will treat with NSAIDs, Post-op shoe and referral to podiatry if not improving.   ED Course       MDM Rules/Calculators/A&P Medical Decision Making Problems Addressed: Toe tendonitis: acute illness or injury  Amount and/or Complexity of Data Reviewed Radiology: ordered and independent interpretation performed. Decision-making details documented in ED Course.  Risk Prescription drug management.     Final Clinical Impression(s) / ED Diagnoses Final diagnoses:  Toe tendonitis    Rx / DC Orders ED Discharge Orders  Ordered    naproxen  (NAPROSYN ) 500 MG tablet  2 times daily        01/26/24 0246             Charmayne Cooper, MD 01/26/24 [phone]

## 2024-01-26 NOTE — ED Triage Notes (Signed)
 Pt states 2nd and 3rd toe on left foot started hurting yesterday.  No known injury
